# Patient Record
Sex: Female | Born: 1965 | Race: White | Hispanic: No | Smoking: Former smoker
Health system: Southern US, Community
[De-identification: ages and names within clinical notes are randomized; demographics above are authoritative.]

## PROBLEM LIST (undated history)

## (undated) DIAGNOSIS — G4733 Obstructive sleep apnea (adult) (pediatric): Secondary | ICD-10-CM

## (undated) DIAGNOSIS — I10 Essential (primary) hypertension: Secondary | ICD-10-CM

## (undated) DIAGNOSIS — R896 Abnormal cytological findings in specimens from other organs, systems and tissues: Secondary | ICD-10-CM

## (undated) DIAGNOSIS — M2141 Flat foot [pes planus] (acquired), right foot: Secondary | ICD-10-CM

## (undated) DIAGNOSIS — H269 Unspecified cataract: Secondary | ICD-10-CM

## (undated) DIAGNOSIS — M1990 Unspecified osteoarthritis, unspecified site: Secondary | ICD-10-CM

## (undated) DIAGNOSIS — F419 Anxiety disorder, unspecified: Secondary | ICD-10-CM

## (undated) DIAGNOSIS — B009 Herpesviral infection, unspecified: Secondary | ICD-10-CM

## (undated) DIAGNOSIS — IMO0001 Reserved for inherently not codable concepts without codable children: Secondary | ICD-10-CM

## (undated) DIAGNOSIS — E7800 Pure hypercholesterolemia, unspecified: Secondary | ICD-10-CM

## (undated) DIAGNOSIS — G4730 Sleep apnea, unspecified: Secondary | ICD-10-CM

## (undated) DIAGNOSIS — Z9989 Dependence on other enabling machines and devices: Secondary | ICD-10-CM

## (undated) DIAGNOSIS — T7840XA Allergy, unspecified, initial encounter: Secondary | ICD-10-CM

## (undated) DIAGNOSIS — D649 Anemia, unspecified: Secondary | ICD-10-CM

## (undated) DIAGNOSIS — R7303 Prediabetes: Secondary | ICD-10-CM

## (undated) DIAGNOSIS — M2142 Flat foot [pes planus] (acquired), left foot: Secondary | ICD-10-CM

## (undated) HISTORY — DX: Prediabetes: R73.03

## (undated) HISTORY — DX: Reserved for inherently not codable concepts without codable children: IMO0001

## (undated) HISTORY — DX: Obstructive sleep apnea (adult) (pediatric): G47.33

## (undated) HISTORY — DX: Herpesviral infection, unspecified: B00.9

## (undated) HISTORY — DX: Dependence on other enabling machines and devices: Z99.89

## (undated) HISTORY — DX: Anemia, unspecified: D64.9

## (undated) HISTORY — DX: Abnormal cytological findings in specimens from other organs, systems and tissues: R89.6

## (undated) HISTORY — PX: OTHER SURGICAL HISTORY: SHX169

## (undated) HISTORY — DX: Flat foot (pes planus) (acquired), left foot: M21.42

## (undated) HISTORY — DX: Anxiety disorder, unspecified: F41.9

## (undated) HISTORY — PX: MOUTH SURGERY: SHX715

## (undated) HISTORY — DX: Unspecified cataract: H26.9

## (undated) HISTORY — PX: POLYPECTOMY: SHX149

## (undated) HISTORY — DX: Allergy, unspecified, initial encounter: T78.40XA

## (undated) HISTORY — DX: Pure hypercholesterolemia, unspecified: E78.00

## (undated) HISTORY — DX: Essential (primary) hypertension: I10

## (undated) HISTORY — DX: Flat foot (pes planus) (acquired), right foot: M21.41

## (undated) HISTORY — DX: Sleep apnea, unspecified: G47.30

## (undated) HISTORY — PX: COLONOSCOPY: SHX174

## (undated) HISTORY — PX: COLPOSCOPY: SHX161

## (undated) HISTORY — PX: KNEE SURGERY: SHX244

## (undated) HISTORY — DX: Unspecified osteoarthritis, unspecified site: M19.90

---

## 2005-03-13 ENCOUNTER — Other Ambulatory Visit: Admission: RE | Admit: 2005-03-13 | Discharge: 2005-03-13 | Payer: Self-pay | Admitting: Gynecology

## 2005-08-17 ENCOUNTER — Ambulatory Visit: Payer: Self-pay

## 2005-08-17 ENCOUNTER — Encounter: Payer: Self-pay | Admitting: Cardiology

## 2005-12-08 ENCOUNTER — Ambulatory Visit (HOSPITAL_COMMUNITY): Admission: RE | Admit: 2005-12-08 | Discharge: 2005-12-08 | Payer: Self-pay | Admitting: Internal Medicine

## 2005-12-21 ENCOUNTER — Ambulatory Visit: Payer: Self-pay | Admitting: Internal Medicine

## 2006-02-19 ENCOUNTER — Ambulatory Visit: Admission: RE | Admit: 2006-02-19 | Discharge: 2006-02-19 | Payer: Self-pay | Admitting: Internal Medicine

## 2006-05-27 ENCOUNTER — Other Ambulatory Visit: Admission: RE | Admit: 2006-05-27 | Discharge: 2006-05-27 | Payer: Self-pay | Admitting: Gynecology

## 2007-04-12 ENCOUNTER — Ambulatory Visit (HOSPITAL_COMMUNITY): Admission: RE | Admit: 2007-04-12 | Discharge: 2007-04-12 | Payer: Self-pay | Admitting: Internal Medicine

## 2007-05-31 ENCOUNTER — Other Ambulatory Visit: Admission: RE | Admit: 2007-05-31 | Discharge: 2007-05-31 | Payer: Self-pay | Admitting: Gynecology

## 2007-06-28 ENCOUNTER — Ambulatory Visit (HOSPITAL_COMMUNITY): Admission: RE | Admit: 2007-06-28 | Discharge: 2007-06-28 | Payer: Self-pay | Admitting: Gynecology

## 2008-01-30 ENCOUNTER — Ambulatory Visit: Payer: Self-pay | Admitting: Gynecology

## 2008-10-04 ENCOUNTER — Ambulatory Visit: Payer: Self-pay | Admitting: Gynecology

## 2008-10-04 ENCOUNTER — Other Ambulatory Visit: Admission: RE | Admit: 2008-10-04 | Discharge: 2008-10-04 | Payer: Self-pay | Admitting: Gynecology

## 2008-10-04 ENCOUNTER — Encounter: Payer: Self-pay | Admitting: Gynecology

## 2008-10-05 ENCOUNTER — Ambulatory Visit (HOSPITAL_COMMUNITY): Admission: RE | Admit: 2008-10-05 | Discharge: 2008-10-05 | Payer: Self-pay | Admitting: Gynecology

## 2008-10-19 ENCOUNTER — Ambulatory Visit: Payer: Self-pay | Admitting: Gynecology

## 2009-04-25 ENCOUNTER — Ambulatory Visit: Payer: Self-pay | Admitting: Gynecology

## 2009-07-03 ENCOUNTER — Ambulatory Visit: Payer: Self-pay | Admitting: Gynecology

## 2009-07-03 ENCOUNTER — Other Ambulatory Visit: Admission: RE | Admit: 2009-07-03 | Discharge: 2009-07-03 | Payer: Self-pay | Admitting: Gynecology

## 2009-07-12 ENCOUNTER — Ambulatory Visit: Payer: Self-pay | Admitting: Gynecology

## 2009-11-11 ENCOUNTER — Other Ambulatory Visit: Admission: RE | Admit: 2009-11-11 | Discharge: 2009-11-11 | Payer: Self-pay | Admitting: Gynecology

## 2009-11-11 ENCOUNTER — Ambulatory Visit: Payer: Self-pay | Admitting: Gynecology

## 2010-01-22 ENCOUNTER — Ambulatory Visit (HOSPITAL_COMMUNITY)
Admission: RE | Admit: 2010-01-22 | Discharge: 2010-01-22 | Payer: Self-pay | Source: Home / Self Care | Admitting: Gynecology

## 2010-03-16 ENCOUNTER — Encounter: Payer: Self-pay | Admitting: Gynecology

## 2010-05-26 ENCOUNTER — Ambulatory Visit (HOSPITAL_COMMUNITY)
Admission: RE | Admit: 2010-05-26 | Discharge: 2010-05-26 | Disposition: A | Discharge status: Home / Self Care | Payer: BC Managed Care – PPO | Source: Ambulatory Visit | Attending: Internal Medicine | Admitting: Internal Medicine

## 2010-05-26 ENCOUNTER — Other Ambulatory Visit (HOSPITAL_COMMUNITY): Payer: Self-pay | Admitting: Internal Medicine

## 2010-05-26 DIAGNOSIS — M25519 Pain in unspecified shoulder: Secondary | ICD-10-CM | POA: Insufficient documentation

## 2010-05-26 DIAGNOSIS — W19XXXA Unspecified fall, initial encounter: Secondary | ICD-10-CM

## 2010-05-26 DIAGNOSIS — R51 Headache: Secondary | ICD-10-CM | POA: Insufficient documentation

## 2010-07-11 NOTE — Op Note (Signed)
NAME:  Wendy Levine, Wendy Levine NO.:  [id number]   MEDICAL RECORD NO.:  [id number]          PATIENT TYPE:  OUT   LOCATION:  CARD                         FACILITY:  [facility]   PHYSICIAN:  Oley Balm. Sung Amabile, MD   DATE OF BIRTH:  01-17-1966   DATE OF PROCEDURE:  02/19/2006  DATE OF DISCHARGE:  02/19/2006                               OPERATIVE REPORT   PROCEDURE PERFORMED:  Cardiopulmonary stress test.   INDICATION FOR TESTING:  Exertional dyspnea.   PROCEDURE:  Cardiopulmonary stress testing was performed on a graded  treadmill.  Testing was stopped due to dyspnea and heart rate goal.  Effort was maximal.  At peak exercise, oxygen uptake was 25.9 mL per kg  per minute or 77% or predicted maximum indicating mild exercise  impairment.   At peak exercise heart rate was 178 beats per minute or 99% of predicted  maximum, indicating that cardiovascular limitation was reached.  Oxygen  pulse was normal suggesting normal __________ volume.  Blood pressure  response was normal.  EKG tracings revealed no definite abnormality  though there was significant EKG artifact noted at peak exercise.   At peak exercise __________ ventilation was 77.1 L per minute or 74% of  predicted maximum indicating that ventilatory reserve remained.  Gas  exchange parameters revealed no abnormalities.  Baseline pulmonary  function tests revealed normal spirometry, lung volumes and diffusion  capacity.  Post exercise spirometry revealed no exercise induced  bronchospasm.   SUMMARY:  Essentially normal cardiopulmonary stress test.  When her  oxygen uptake is corrected for body weight, she is at 115% of predicted  maximum, suggesting that the major cause of her exercise imitation is  obesity.  Otherwise, no abnormalities noted.      Oley Balm Sung Amabile, MD  Electronically Signed     DBS/MEDQ  D:  03/22/2006  T:  03/22/2006  Job:  [id number]   cc:   Charlaine Dalton. Sherene Sires, MD, FCCP  [address]  [place] [address]   [facility]

## 2010-07-11 NOTE — Assessment & Plan Note (Signed)
[facility]                               PULMONARY OFFICE NOTE   Wendy Levine, Wendy Levine                 MRN:          [id number]  DATE:12/21/2005                            DOB:          [birth year]    PULMONARY CONSULTATION   CHIEF COMPLAINT:  Dyspnea.   HISTORY:  [name] with reproducible dyspnea with exertion  (going up steps) or bending over that has worsened.  It was first noticed a  year ago but has worsened since Labor Day.  In addition, she developed left-  sided chest discomfort since Labor Day that is constant in nature and  reproduced by palpating her pectoralis major muscle.  The chest pain is not  pleuritic and is not related to the dyspnea complaints in terms of onset  pattern.  Interestingly, this patient has a history of obesity and was  previously working on a Scientist, research (physical sciences) and lost about 37 pounds before stopping  the [employer] about the same time she started noticing the dyspnea and has  not been back on it.  The dyspnea does occur walking outside as well as  inside (but only if she goes up steep hills outside).  She denies any  variability in terms of activity tolerance, orthopnea, PND, leg swelling,  cough, pleuritic chest pain, or leg swelling.  She denies anything that  makes the breathing better or worse other than exercise.  Has not been  treated with any medications that helped.   PAST MEDICAL HISTORY:  Significant for fibromyalgia and anxiety disorder, as  well as hyperlipidemia.   ALLERGIES:  None known.   MEDICATIONS:  Crestor and [place].   SOCIAL HISTORY:  She quit smoking in February of 2001.  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   FAMILY HISTORY:  Positive for emphysema and breast cancer in her maternal  grandfather.  Negative for allergies or atopy, or asthma.   REVIEW OF SYSTEMS:  Taken in detail on the worksheet and  significant for  problems as outlined above.   PHYSICAL EXAMINATION:  This is a quite pleasant ambulatory white female in  no acute distress.  VITAL SIGNS:  Stable vital signs.  Weight 207 pounds, down 37 pounds from  her maximum weight of a year ago.  HEENT:  Unremarkable.  Oropharynx is clear.  NECK:  Supple without cervical adenopathy or tenderness. Trachea is midline.  No thyromegaly.  LUNGS:  Fields perfectly clear bilaterally to auscultation and percussion.  Taking 2 deep breaths in and out made her feel dizzy with numbness in her  hands and around her mouth.  CARDIAC:  Regular rate and rhythm without murmur, gallop, or rub.  ABDOMEN:  Soft and benign.  EXTREMITIES:  Warm without calf tenderness, cyanosis, clubbing, or edema.   The patient reports she had an echo done within the last year, which was  normal, and also a full lab profile, which was normal.  A CT scan of the  chest was dated December 08, 2005, showed a minimal anterior mediastinal  partially fatty  density consistent with a residual thymus.  Otherwise,  normal.   IMPRESSION:  Reproducible dyspnea with exertion.  Only occurs with heavy  exertion, such as going up steps or steep hills, and most likely is related  in a somewhat unusual fashion to conditioning.  (Note, she had previously  been working on a Scientist, research (physical sciences) but stopped the [employer] about the same  time the problem began.)  That is, I believe she is now experiencing  shortness of breath doing a task that is pushing her well above her normal  aerobic level.  Of note also she is under quite a bit of stress related to  the use of contractors, but also is related to her father-in-law who has  similar symptoms before he died when he was diagnosed with lung cancer.   I therefore recommended regular exercise 30 minutes daily to the point where  she is short of breath, but not out of breath, and then a followup 2 weeks  later with a CPST to see if we can  reproduce any of her symptoms in the lab.   I have reassured her that it is very unlikely she has pulmonary embolism or  significant heart disease, and certainly not lung cancer based on the  negative studies that have been done to date.    ______________________________  Charlaine Dalton. Sherene Sires, MD, [facility]    MBW/MedQ  DD: 12/21/2005  DT: 12/21/2005  Job #: [id number]   cc:   Lovenia Kim, D.O.

## 2010-09-01 ENCOUNTER — Encounter: Payer: Self-pay | Admitting: *Deleted

## 2010-11-14 ENCOUNTER — Ambulatory Visit (INDEPENDENT_AMBULATORY_CARE_PROVIDER_SITE_OTHER): Payer: 59 | Admitting: Gynecology

## 2010-11-14 ENCOUNTER — Encounter: Payer: Self-pay | Admitting: Gynecology

## 2010-11-14 ENCOUNTER — Other Ambulatory Visit (HOSPITAL_COMMUNITY)
Admission: RE | Admit: 2010-11-14 | Discharge: 2010-11-14 | Disposition: A | Discharge status: Home / Self Care | Payer: 59 | Source: Ambulatory Visit | Attending: Gynecology | Admitting: Gynecology

## 2010-11-14 VITALS — BP 130/70 | Ht 65.25 in | Wt 257.0 lb

## 2010-11-14 DIAGNOSIS — Z01419 Encounter for gynecological examination (general) (routine) without abnormal findings: Secondary | ICD-10-CM

## 2010-11-14 DIAGNOSIS — N898 Other specified noninflammatory disorders of vagina: Secondary | ICD-10-CM

## 2010-11-14 DIAGNOSIS — B3731 Acute candidiasis of vulva and vagina: Secondary | ICD-10-CM

## 2010-11-14 DIAGNOSIS — R87610 Atypical squamous cells of undetermined significance on cytologic smear of cervix (ASC-US): Secondary | ICD-10-CM

## 2010-11-14 DIAGNOSIS — L292 Pruritus vulvae: Secondary | ICD-10-CM

## 2010-11-14 DIAGNOSIS — IMO0001 Reserved for inherently not codable concepts without codable children: Secondary | ICD-10-CM

## 2010-11-14 DIAGNOSIS — L293 Anogenital pruritus, unspecified: Secondary | ICD-10-CM

## 2010-11-14 DIAGNOSIS — B373 Candidiasis of vulva and vagina: Secondary | ICD-10-CM

## 2010-11-14 MED ORDER — NYSTATIN-TRIAMCINOLONE 100000-0.1 UNIT/GM-% EX OINT: TOPICAL_OINTMENT | Freq: Two times a day (BID) | CUTANEOUS | Status: DC

## 2010-11-14 MED ORDER — FLUCONAZOLE 200 MG PO TABS: 200.0000 mg | ORAL_TABLET | Freq: Every day | ORAL | Status: AC

## 2010-11-14 NOTE — Progress Notes (Signed)
Wendy Levine [birth year] [id number]        45 y.o.  for annual exam.  Notes an area above her clitoris which is chronically itchy no lesions. This is always scratching. She does take Diflucan intermittently says it transiently helps. No complaints of discharge or other symptoms. Has history of ASCUS negative high risk HPV August 2010.  Followup Pap smear in May and September last year that again showed ASCUS.  Colposcopy may 2011 was normal no biopsies were taken.  Past medical history,surgical history, medications, allergies, family history and social history were all reviewed and documented in the EPIC chart. ROS:  Was performed and pertinent positives and negatives are included in the history.  Exam: chaperone present Filed Vitals:   11/14/10 1103  BP: 130/70   General appearance  Normal Skin grossly normal Head/Neck normal with no cervical or supraclavicular adenopathy thyroid normal Lungs  clear Cardiac RR, without RMG Abdominal  soft, nontender, without masses, organomegaly or hernia Breasts  examined lying and sitting without masses, retractions, discharge or axillary adenopathy. Pelvic  Ext/BUS/vagina  Normal, the area she's pointing to lower mid mons pubis above the clitoral hood is totally normal in appearance, white thick vaginal discharge noted wet prep done  Cervix  normal  Pap done  Uterus  anteverted, normal size, shape and contour, midline and mobile nontender   Adnexa  Without masses or tenderness    Anus and perineum  normal   Rectovaginal  normal sphincter tone without palpated masses or tenderness. No overt hemorrhoids noted   Assessment/Plan:  45 y.o. female for annual exam.    #1 Pruritic skin mons pubis, thick white discharge. Wet prep is positive for yeast. We'll treat with Diflucan 200 mg x7 days to hopefully eradicate the cutaneous yeast. I also gave her a prescription for Mytrex cream to apply to the skin twice a day. She'll followup if her symptoms  persist or recur.  I asked her to stop her Crestor while she was taking the Diflucan. #2 Persistent ASCUS. Negative high risk HPV. Normal colposcopy. Pap done today if normal follow with annual Pap smear if abnormal triage based on results. #3 Birth control. Patient and husband using condoms. I discussed the failure risk with this and she understands accepts. She did have some questions about the IUD I encouraged her to pursue this she can't think about it. I also discussed the availability of plan B if there is a break or slip and the condom. #4 Health maintenance. Self breast exams on monthly basis discussed encouraged. Due for mammography in November I reminded her to schedule this. No blood work was done this is all done through her primary who follows her for her medical issues. Patient will followup for Pap smear results and otherwise annually for her annual exam.    Dara Lords MD, 12:02 PM 11/14/2010

## 2011-01-05 ENCOUNTER — Other Ambulatory Visit: Payer: Self-pay | Admitting: Gynecology

## 2011-01-30 ENCOUNTER — Telehealth: Payer: Self-pay | Admitting: *Deleted

## 2011-01-30 NOTE — Telephone Encounter (Signed)
Pt called stating that her period is 2 weeks late and took 2 upt test and both were negative. Pt wanted to come by to have blood drawn. I spoke with pt and told her that OV would be best. Pt says she will make office visit on Monday to have this done.

## 2011-02-02 ENCOUNTER — Encounter: Payer: Self-pay | Admitting: Gynecology

## 2011-02-02 ENCOUNTER — Ambulatory Visit (INDEPENDENT_AMBULATORY_CARE_PROVIDER_SITE_OTHER): Payer: 59 | Admitting: Gynecology

## 2011-02-02 DIAGNOSIS — M545 Low back pain, unspecified: Secondary | ICD-10-CM

## 2011-02-02 DIAGNOSIS — B3731 Acute candidiasis of vulva and vagina: Secondary | ICD-10-CM

## 2011-02-02 DIAGNOSIS — B373 Candidiasis of vulva and vagina: Secondary | ICD-10-CM

## 2011-02-02 DIAGNOSIS — N912 Amenorrhea, unspecified: Secondary | ICD-10-CM

## 2011-02-02 DIAGNOSIS — Z23 Encounter for immunization: Secondary | ICD-10-CM

## 2011-02-02 DIAGNOSIS — N898 Other specified noninflammatory disorders of vagina: Secondary | ICD-10-CM

## 2011-02-02 MED ORDER — FLUCONAZOLE 200 MG PO TABS: 200.0000 mg | ORAL_TABLET | Freq: Every day | ORAL | Status: AC

## 2011-02-02 NOTE — Progress Notes (Addendum)
Patient presents with several issues. First is her menses have been late for the last 2 cycles and she has not had a period now since 12/17/2010. She is using condoms for contraception. She is checked 3 home pregnancy tests which were negative. No weight changes hair changes skin changes or menopausal symptoms. She also was seen recently for useful vaginitis which cleared with Diflucan 200 daily x7 days and Mytrex to her skin. She was on a course of oral antibiotics for her face and notes that she has a slight discharge returning. No real itching.  Lastly she notes several days of low back pain although she thinks is due to lifting she tried some heat but did not seem to help. She has not tried anything such as Motrin or Advil. She's not having any dysuria frequency urgency or other urinary symptoms.  Exam with chaperone present Abdomen soft nontender without masses guarding rebound organomegaly Pelvic external BUS vagina normal with white discharge. Cervix normal. Uterus normal size midline mobile nontender. Adnexa without masses or tenderness.   Assessment and plan: #1 mild menstrual irregularity.  Will check qualitative hCG, FSH, TSH, prolactin. Discussed progesterone withdrawal if testing negative. More consistent contraception was reviewed and I get encouraged her to consider Mirena IUD and she is going to do so. #2 slight white discharge. KOH wet prep is positive for yeast. We'll treat with Diflucan 200 daily x5 days follow up if symptoms persist or recur.  #3 low back pain. Will check UA. I suspect is musculoskeletal recommend heat and OTC anti-inflammatories.

## 2011-02-02 NOTE — Patient Instructions (Signed)
Take Diflucan as prescribed. Follow up for blood work results. Consider Mirena IUD as discussed.

## 2011-02-03 ENCOUNTER — Encounter: Payer: Self-pay | Admitting: Gynecology

## 2011-02-03 ENCOUNTER — Telehealth: Payer: Self-pay | Admitting: *Deleted

## 2011-02-03 NOTE — Telephone Encounter (Signed)
Patient called for blood pregnancy test results.  Informed negative.

## 2011-02-04 ENCOUNTER — Telehealth: Payer: Self-pay | Admitting: *Deleted

## 2011-02-04 DIAGNOSIS — E221 Hyperprolactinemia: Secondary | ICD-10-CM

## 2011-02-04 DIAGNOSIS — R350 Frequency of micturition: Secondary | ICD-10-CM

## 2011-02-04 MED ORDER — MEDROXYPROGESTERONE ACETATE 10 MG PO TABS: 10.0000 mg | ORAL_TABLET | Freq: Two times a day (BID) | ORAL | Status: DC

## 2011-02-04 NOTE — Telephone Encounter (Signed)
Lm for pt to call

## 2011-02-04 NOTE — Telephone Encounter (Signed)
Pt informed with the below note. 

## 2011-02-04 NOTE — Telephone Encounter (Signed)
Pt had office visit on 02/02/11 and forgot to leave U/A as directed, she would like to stop and give U/A if possible? She also wanted to know if you were going to give her Rx to start period?

## 2011-02-04 NOTE — Telephone Encounter (Signed)
Dropping off her urine is okay. I was waiting for her other labs to return which they just came back and did show that her prolactin was mildly abnormal. That sometimes can lead to your regular periods. I recommend she recheck a fasting prolactin say she may want to come in and give [place] a urine and a prolactin level in the morning when she is fasting I put the order in. As far as her menses I would go with Provera 10 mg twice a day x5 days call me if her period does not start after this and I put that order and also.

## 2011-02-04 NOTE — Progress Notes (Signed)
Addended by: Ether Griffins on: 02/04/2011 08:37 AM   Modules accepted: Orders

## 2011-02-06 ENCOUNTER — Other Ambulatory Visit (INDEPENDENT_AMBULATORY_CARE_PROVIDER_SITE_OTHER): Payer: 59

## 2011-02-06 DIAGNOSIS — R350 Frequency of micturition: Secondary | ICD-10-CM

## 2011-02-06 DIAGNOSIS — E229 Hyperfunction of pituitary gland, unspecified: Secondary | ICD-10-CM

## 2011-02-06 DIAGNOSIS — E221 Hyperprolactinemia: Secondary | ICD-10-CM

## 2011-03-19 ENCOUNTER — Other Ambulatory Visit: Payer: Self-pay | Admitting: *Deleted

## 2011-03-19 MED ORDER — MEDROXYPROGESTERONE ACETATE 10 MG PO TABS: 10.0000 mg | ORAL_TABLET | Freq: Two times a day (BID) | ORAL | Status: AC

## 2011-04-17 ENCOUNTER — Other Ambulatory Visit: Payer: Self-pay | Admitting: Dermatology

## 2011-04-20 ENCOUNTER — Encounter: Payer: Self-pay | Admitting: *Deleted

## 2011-04-20 ENCOUNTER — Other Ambulatory Visit: Payer: Self-pay | Admitting: *Deleted

## 2011-04-20 MED ORDER — VALACYCLOVIR HCL 500 MG PO TABS: 500.0000 mg | ORAL_TABLET | Freq: Every day | ORAL | Status: DC

## 2011-04-20 NOTE — Telephone Encounter (Signed)
[facility] faxed a request to change Valacyclovir HCL 500 to a 90 day supply.  Is that ok?

## 2011-04-20 NOTE — Telephone Encounter (Signed)
okay

## 2011-04-20 NOTE — Telephone Encounter (Signed)
rx sent in 

## 2011-04-20 NOTE — Progress Notes (Signed)
Patient ID: Wendy Levine, female   DOB: 30-Apr-1965, 46 y.o.   MRN: [id number] Pt called and left message wanting to know when last refill on provera 10 mg. Left message on pt vm that 03/19/11 was last refill.

## 2011-05-14 ENCOUNTER — Other Ambulatory Visit: Payer: Self-pay | Admitting: Gynecology

## 2011-05-14 DIAGNOSIS — Z1231 Encounter for screening mammogram for malignant neoplasm of breast: Secondary | ICD-10-CM

## 2011-06-08 ENCOUNTER — Ambulatory Visit (HOSPITAL_COMMUNITY)
Admission: RE | Admit: 2011-06-08 | Discharge: 2011-06-08 | Disposition: A | Discharge status: Home / Self Care | Payer: 59 | Source: Ambulatory Visit | Attending: Internal Medicine | Admitting: Internal Medicine

## 2011-06-08 ENCOUNTER — Other Ambulatory Visit (HOSPITAL_COMMUNITY): Payer: Self-pay | Admitting: Internal Medicine

## 2011-06-08 DIAGNOSIS — R072 Precordial pain: Secondary | ICD-10-CM

## 2011-06-08 DIAGNOSIS — R0602 Shortness of breath: Secondary | ICD-10-CM | POA: Insufficient documentation

## 2011-06-11 ENCOUNTER — Ambulatory Visit (HOSPITAL_COMMUNITY)
Admission: RE | Admit: 2011-06-11 | Discharge: 2011-06-11 | Disposition: A | Discharge status: Home / Self Care | Payer: 59 | Source: Ambulatory Visit | Attending: Gynecology | Admitting: Gynecology

## 2011-06-11 DIAGNOSIS — Z1231 Encounter for screening mammogram for malignant neoplasm of breast: Secondary | ICD-10-CM

## 2012-04-13 ENCOUNTER — Ambulatory Visit (INDEPENDENT_AMBULATORY_CARE_PROVIDER_SITE_OTHER): Payer: 59 | Admitting: Gynecology

## 2012-04-13 ENCOUNTER — Encounter: Payer: Self-pay | Admitting: Gynecology

## 2012-04-13 DIAGNOSIS — A499 Bacterial infection, unspecified: Secondary | ICD-10-CM

## 2012-04-13 DIAGNOSIS — N898 Other specified noninflammatory disorders of vagina: Secondary | ICD-10-CM

## 2012-04-13 DIAGNOSIS — B9689 Other specified bacterial agents as the cause of diseases classified elsewhere: Secondary | ICD-10-CM

## 2012-04-13 DIAGNOSIS — N760 Acute vaginitis: Secondary | ICD-10-CM

## 2012-04-13 DIAGNOSIS — L293 Anogenital pruritus, unspecified: Secondary | ICD-10-CM

## 2012-04-13 LAB — WET PREP FOR TRICH, YEAST, CLUE
Trich, Wet Prep: NONE SEEN
Yeast Wet Prep HPF POC: NONE SEEN

## 2012-04-13 MED ORDER — VALACYCLOVIR HCL 500 MG PO TABS: 500.0000 mg | ORAL_TABLET | Freq: Every day | ORAL | Status: DC

## 2012-04-13 MED ORDER — METRONIDAZOLE 500 MG PO TABS: 500.0000 mg | ORAL_TABLET | Freq: Two times a day (BID) | ORAL | Status: DC

## 2012-04-13 MED ORDER — MEGESTROL ACETATE 20 MG PO TABS: 20.0000 mg | ORAL_TABLET | Freq: Two times a day (BID) | ORAL | Status: DC

## 2012-04-13 MED ORDER — FLUCONAZOLE 200 MG PO TABS: 200.0000 mg | ORAL_TABLET | Freq: Every day | ORAL | Status: DC

## 2012-04-13 NOTE — Patient Instructions (Signed)
Take Flagyl 500 mg twice daily for 7 days, avoid alcohol Take Diflucan daily for 2 days Start Megace 20 mg tablet twice daily once your period starts to help decrease the heaviness of bleeding. Follow up for annual exam that needs to be scheduled now.

## 2012-04-13 NOTE — Progress Notes (Signed)
Patient presents with several issues. She said several weeks of vulvar pruritus that she's used OTC antifungal without relief. Is also due to go on a trip overseas in 2 weeks and wants to know about how she can suppress her menses as she tends to have very heavy crampy periods.  Exam with Sherrilyn Rist assistant Abdomen obese, soft nontender without masses guarding rebound organomegaly Pelvic external BUS vagina with generalized mild vulvitis. No specific lesions. Vagina with white discharge. Bimanual without masses or tenderness.  Assessment and plan: 1. Vulvovaginitis. Wet prep consistent with bacterial vaginosis. We'll treat with Flagyl 500 mg twice a day x7 days, alcohol avoidance reviewed. We'll also cover for yeast as she is traveling out of country with Diflucan 200 mg daily x2 days. 2. Refill on her Valtrex 500 mg daily that she takes for suppression #30 with 1 refill 3. Menstrual suppression. She is midcycle now I reviewed this very difficult to suppress periods once the cycle has begun. Using rhythm method for contraception. Recommended Megace 20 mg twice a day once her period starts to see if we can suppress heavy bleeding. #30 provided. Failure risk discussed. 4. Overdue for annual. Patient knows she is overdue for her annual as her last was September 2012 and she will schedule it now. We'll further discuss her heavy menses and options at that time.

## 2012-04-18 ENCOUNTER — Encounter: Payer: Self-pay | Admitting: *Deleted

## 2012-04-18 NOTE — Progress Notes (Signed)
Patient ID: Wendy Levine, female   DOB: 1965/11/19, 47 y.o.   MRN: [id number] Pt left message on voicemail with question if pt husband should be on any medication from last OV pt was dx: BV infection, I left message that husband is okay without rx.

## 2012-05-10 ENCOUNTER — Encounter: Payer: 59 | Admitting: Gynecology

## 2012-05-18 ENCOUNTER — Other Ambulatory Visit (HOSPITAL_COMMUNITY)
Admission: RE | Admit: 2012-05-18 | Discharge: 2012-05-18 | Disposition: A | Discharge status: Home / Self Care | Payer: 59 | Source: Ambulatory Visit | Attending: Gynecology | Admitting: Gynecology

## 2012-05-18 ENCOUNTER — Other Ambulatory Visit: Payer: Self-pay | Admitting: Gynecology

## 2012-05-18 ENCOUNTER — Telehealth: Payer: Self-pay | Admitting: Gynecology

## 2012-05-18 ENCOUNTER — Ambulatory Visit (INDEPENDENT_AMBULATORY_CARE_PROVIDER_SITE_OTHER): Payer: 59 | Admitting: Gynecology

## 2012-05-18 ENCOUNTER — Encounter: Payer: Self-pay | Admitting: Gynecology

## 2012-05-18 VITALS — BP 134/80 | Ht 66.0 in | Wt 260.0 lb

## 2012-05-18 DIAGNOSIS — N920 Excessive and frequent menstruation with regular cycle: Secondary | ICD-10-CM

## 2012-05-18 DIAGNOSIS — Z3049 Encounter for surveillance of other contraceptives: Secondary | ICD-10-CM

## 2012-05-18 DIAGNOSIS — Z01419 Encounter for gynecological examination (general) (routine) without abnormal findings: Secondary | ICD-10-CM

## 2012-05-18 DIAGNOSIS — Z13220 Encounter for screening for lipoid disorders: Secondary | ICD-10-CM

## 2012-05-18 DIAGNOSIS — L293 Anogenital pruritus, unspecified: Secondary | ICD-10-CM

## 2012-05-18 DIAGNOSIS — Z1151 Encounter for screening for human papillomavirus (HPV): Secondary | ICD-10-CM | POA: Insufficient documentation

## 2012-05-18 DIAGNOSIS — L292 Pruritus vulvae: Secondary | ICD-10-CM

## 2012-05-18 LAB — LIPID PANEL
Cholesterol: 193 mg/dL (ref 0–200)
HDL: 43 mg/dL (ref >39)
LDL Cholesterol: 101 mg/dL — ABNORMAL HIGH (ref 0–99)
Total CHOL/HDL Ratio: 4.5 Ratio
Triglycerides: 246 mg/dL — ABNORMAL HIGH (ref <150)
VLDL: 49 mg/dL — ABNORMAL HIGH (ref 0–40)

## 2012-05-18 LAB — CBC WITH DIFFERENTIAL/PLATELET
Basophils Absolute: 0.1 10*3/uL (ref 0.0–0.1)
Basophils Relative: 1 % (ref 0–1)
Eosinophils Absolute: 0.1 10*3/uL (ref 0.0–0.7)
Eosinophils Relative: 1 % (ref 0–5)
HCT: 39.3 % (ref 36.0–46.0)
Hemoglobin: 13 g/dL (ref 12.0–15.0)
Lymphocytes Relative: 19 % (ref 12–46)
Lymphs Abs: 1.5 10*3/uL (ref 0.7–4.0)
MCH: 26.7 pg (ref 26.0–34.0)
MCHC: 33.1 g/dL (ref 30.0–36.0)
MCV: 80.9 fL (ref 78.0–100.0)
Monocytes Absolute: 0.4 10*3/uL (ref 0.1–1.0)
Monocytes Relative: 5 % (ref 3–12)
Neutro Abs: 6.2 10*3/uL (ref 1.7–7.7)
Neutrophils Relative %: 74 % (ref 43–77)
Platelets: 292 10*3/uL (ref 150–400)
RBC: 4.86 MIL/uL (ref 3.87–5.11)
RDW: 14.7 % (ref 11.5–15.5)
WBC: 8.3 10*3/uL (ref 4.0–10.5)

## 2012-05-18 LAB — COMPREHENSIVE METABOLIC PANEL
ALT: 17 U/L (ref 0–35)
AST: 15 U/L (ref 0–37)
Albumin: 4.3 g/dL (ref 3.5–5.2)
Alkaline Phosphatase: 81 U/L (ref 39–117)
BUN: 13 mg/dL (ref 6–23)
CO2: 25 mEq/L (ref 19–32)
Calcium: 9.2 mg/dL (ref 8.4–10.5)
Chloride: 103 mEq/L (ref 96–112)
Creat: 0.84 mg/dL (ref 0.50–1.10)
Glucose, Bld: 85 mg/dL (ref 70–99)
Potassium: 4.1 mEq/L (ref 3.5–5.3)
Sodium: 137 mEq/L (ref 135–145)
Total Bilirubin: 0.5 mg/dL (ref 0.3–1.2)
Total Protein: 7.2 g/dL (ref 6.0–8.3)

## 2012-05-18 LAB — HM PAP SMEAR: HM Pap smear: NORMAL

## 2012-05-18 MED ORDER — LEVONORGESTREL 20 MCG/24HR IU IUD: INTRAUTERINE_SYSTEM | Freq: Once | INTRAUTERINE | Status: DC

## 2012-05-18 NOTE — Telephone Encounter (Signed)
I left message on pt cell(ok per DPR) that the Mirena IUD is covered at 100%,no copay. She will call to schedule on her cycle.WL

## 2012-05-18 NOTE — Patient Instructions (Signed)
Followup with decision about IUD. Schedule mammogram in April. Intrauterine Device Insertion Most often, an intrauterine device (IUD) is inserted into the uterus to prevent pregnancy. There are 2 types of IUDs available:  Copper IUD. This type of IUD creates an environment that is not favorable to sperm survival. The mechanism of action of the copper IUD is not known for certain. It can stay in place for 10 years.  Hormone IUD. This type of IUD contains the hormone progestin (synthetic progesterone). The progestin thickens the cervical mucus and prevents sperm from entering the uterus, and it also thins the uterine lining. There is no evidence that the hormone IUD prevents implantation. The hormone IUD can stay in place for up to 5 years. An IUD is the most cost-effective birth control if left in place for the full duration. It may be removed at any time. LET YOUR CAREGIVER KNOW ABOUT:  Sensitivity to metals.  Medicines taken including herbs, eyedrops, over-the-counter medicines, and creams.  Use of steroids (by mouth or creams).  Previous problems with anesthetics or numbing medicine.  Previous gynecological surgery.  History of blood clots or clotting disorders.  Possibility of pregnancy.  Menstrual irregularities.  Concerns regarding unusual vaginal discharge or odors.  Previous experience with an IUD.  Other health problems. RISKS AND COMPLICATIONS  Accidental puncture (perforation) of the uterus.  Accidental placement of the IUD either in the muscle layer of the uterus (myometrium) or outside the uterus. If this happen, the IUD can be found essentially floating around the bowels. When this happens, the IUD must be taken out surgically.  The IUD may fall out of the uterus (expulsion). This is more common in women who have recently had a child.   Pregnancy in the fallopian tube (ectopic). BEFORE THE PROCEDURE  Schedule the IUD insertion for when you will have your  menstrual period or right after, to make sure you are not pregnant. Placement of the IUD is better tolerated shortly after a menstrual cycle.  You may need to take tests or be examined to make sure you are not pregnant.  You may be required to take a pregnancy test.  You may be required to get checked for sexually transmitted infections (STIs) prior to placement. Placing an IUD in someone who has an infection can make an infection worse.  You may be given a pain reliever to take 1 or 2 hours before the procedure.  An exam will be performed to determine the size and position of your uterus.  Ask your caregiver about changing or stopping your regular medicines. PROCEDURE   A tool (speculum) is placed in the vagina. This allows your caregiver to see the lower part of the uterus (cervix).  The cervix is prepped with a medicine that lowers the risk of infection.  You may be given a medicine to numb each side of the cervix (intracervical or paracervical block). This is used to block and control any discomfort with insertion.  A tool (uterine sound) is inserted into the uterus to determine the length of the uterine cavity and the direction the uterus may be tilted.  A slim instrument (IUD inserter) is inserted through the cervical canal and into your uterus.  The IUD is placed in the uterine cavity and the insertion device is removed.  The nylon string that is attached to the IUD, and used for eventual IUD removal, is trimmed. It is trimmed so that it lays high in the vagina, just outside the cervix. AFTER  THE PROCEDURE  You may have bleeding after the procedure. This is normal. It varies from light spotting for a few days to menstrual-like bleeding.  You may have mild cramping.  Practice checking the string coming out of the cervix to make sure the IUD remains in the uterus. If you cannot feel the string, you should schedule a "string check" with your caregiver.  If you had a hormone  IUD inserted, expect that your period may be lighter or nonexistent within a year's time (though this is not always the case). There may be delayed fertility with the hormone IUD as a result of its progesterone effect. When you are ready to become pregnant, it is suggested to have the IUD removed up to 1 year in advance.  Yearly exams are advised. Document Released: 10/08/2010 Document Revised: 05/04/2011 Document Reviewed: 10/08/2010 [facility] Patient Information 2013 [place], [place].
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
## 2012-05-19 LAB — URINALYSIS W MICROSCOPIC + REFLEX CULTURE
Bacteria, UA: NONE SEEN
Bilirubin Urine: NEGATIVE
Casts: NONE SEEN
Crystals: NONE SEEN
Glucose, UA: NEGATIVE mg/dL
Hgb urine dipstick: NEGATIVE
Ketones, ur: NEGATIVE mg/dL
Leukocytes, UA: NEGATIVE
Nitrite: NEGATIVE
Protein, ur: NEGATIVE mg/dL
Specific Gravity, Urine: 1.021 (ref 1.005–1.030)
Squamous Epithelial / LPF: NONE SEEN
Urobilinogen, UA: 0.2 mg/dL (ref 0.0–1.0)
pH: 5.5 (ref 5.0–8.0)

## 2012-05-23 ENCOUNTER — Telehealth: Payer: Self-pay | Admitting: *Deleted

## 2012-05-23 ENCOUNTER — Other Ambulatory Visit: Payer: Self-pay | Admitting: Gynecology

## 2012-05-23 ENCOUNTER — Other Ambulatory Visit: Payer: Self-pay | Admitting: *Deleted

## 2012-05-23 DIAGNOSIS — Z1231 Encounter for screening mammogram for malignant neoplasm of breast: Secondary | ICD-10-CM

## 2012-05-23 DIAGNOSIS — E7800 Pure hypercholesterolemia, unspecified: Secondary | ICD-10-CM

## 2012-05-23 NOTE — Telephone Encounter (Signed)
Pt has IUD insertion appointment on 05/31/12, pt said that you told her that you would give her a shot to help numb her cervix? She asked me about this information and I told her I would check with you. Pt said she doesn't handle pain well and this will help with the discomfort. Please advise

## 2012-05-23 NOTE — Telephone Encounter (Signed)
Left the below note on pt voicemail. 

## 2012-05-23 NOTE — Telephone Encounter (Signed)
I talked about doing a paracervical block which will help with discomfort with placement.

## 2012-05-31 ENCOUNTER — Ambulatory Visit: Payer: 59 | Admitting: Gynecology

## 2012-06-13 ENCOUNTER — Ambulatory Visit (HOSPITAL_COMMUNITY)
Admission: RE | Admit: 2012-06-13 | Discharge: 2012-06-13 | Disposition: A | Discharge status: Home / Self Care | Payer: 59 | Source: Ambulatory Visit | Attending: Gynecology | Admitting: Gynecology

## 2012-06-13 DIAGNOSIS — Z1231 Encounter for screening mammogram for malignant neoplasm of breast: Secondary | ICD-10-CM

## 2012-06-13 LAB — HM MAMMOGRAPHY: HM Mammogram: NORMAL

## 2012-06-27 ENCOUNTER — Telehealth: Payer: Self-pay

## 2012-06-27 NOTE — Telephone Encounter (Signed)
I was holding Mirena IUD for patient since March and I called to follow-up with patient. She said she does not think she wants it and will call if she changes her mind but asked me to return it to stock.

## 2012-07-19 ENCOUNTER — Other Ambulatory Visit: Payer: Self-pay | Admitting: Gynecology

## 2012-07-22 ENCOUNTER — Other Ambulatory Visit: Payer: Self-pay | Admitting: *Deleted

## 2012-07-22 DIAGNOSIS — N898 Other specified noninflammatory disorders of vagina: Secondary | ICD-10-CM

## 2012-07-22 DIAGNOSIS — B3731 Acute candidiasis of vulva and vagina: Secondary | ICD-10-CM

## 2012-07-22 DIAGNOSIS — B373 Candidiasis of vulva and vagina: Secondary | ICD-10-CM

## 2012-07-22 DIAGNOSIS — L292 Pruritus vulvae: Secondary | ICD-10-CM

## 2012-07-22 MED ORDER — NYSTATIN-TRIAMCINOLONE 100000-0.1 UNIT/GM-% EX OINT: TOPICAL_OINTMENT | Freq: Two times a day (BID) | CUTANEOUS | Status: AC

## 2012-08-24 ENCOUNTER — Other Ambulatory Visit: Payer: Self-pay | Admitting: Dermatology

## 2012-09-19 ENCOUNTER — Other Ambulatory Visit: Payer: Self-pay | Admitting: Gynecology

## 2012-09-28 ENCOUNTER — Other Ambulatory Visit: Payer: Self-pay | Admitting: Dermatology

## 2012-11-14 ENCOUNTER — Encounter: Payer: 59 | Admitting: Gynecology

## 2012-11-18 ENCOUNTER — Ambulatory Visit: Payer: 59 | Admitting: Gynecology

## 2012-11-21 ENCOUNTER — Ambulatory Visit (INDEPENDENT_AMBULATORY_CARE_PROVIDER_SITE_OTHER): Payer: 59 | Admitting: Gynecology

## 2012-11-21 ENCOUNTER — Telehealth: Payer: Self-pay | Admitting: *Deleted

## 2012-11-21 ENCOUNTER — Encounter: Payer: Self-pay | Admitting: Gynecology

## 2012-11-21 DIAGNOSIS — Z23 Encounter for immunization: Secondary | ICD-10-CM

## 2012-11-21 DIAGNOSIS — N898 Other specified noninflammatory disorders of vagina: Secondary | ICD-10-CM

## 2012-11-21 DIAGNOSIS — L293 Anogenital pruritus, unspecified: Secondary | ICD-10-CM

## 2012-11-21 LAB — WET PREP FOR TRICH, YEAST, CLUE
Clue Cells Wet Prep HPF POC: NONE SEEN
Trich, Wet Prep: NONE SEEN
Yeast Wet Prep HPF POC: NONE SEEN

## 2012-11-21 MED ORDER — NONFORMULARY OR COMPOUNDED ITEM: Status: DC

## 2012-11-21 NOTE — Addendum Note (Signed)
Addended by: Dayna Barker on: 11/21/2012 11:36 AM   Modules accepted: Orders

## 2012-11-21 NOTE — Telephone Encounter (Signed)
Message copied by Aura Camps on Mon Nov 21, 2012 11:28 AM ------      Message from: Dara Lords      Created: Mon Nov 21, 2012 11:11 AM       Call in Boric Acid Supp. 600mg  [facility]  #30 one er vagina 2 x weekly refill x 3 ------

## 2012-11-21 NOTE — Progress Notes (Signed)
Patient presents with one-week history of vaginal itching and vulvar itching. She used an OTC homeopathic suppository that had Borax unit. She notes this seems to have helped with resolving symptoms. She does have a history of recurrent yeast infections has been treated with Mytrex externally, Diflucan and vaginal creams in the past. Feels she gets an outbreak at least once a month. Particularly before her period.  Exam was Administrator, [employer] vagina with white discharge. Cervix normal. Uterus normal size midline mobile nontender. Adnexa without masses or tenderness.  Assessment and plan: History of recurrent vaginitis. Wet prep shows some bacteria but no evidence of yeast. Has been using the OTC suppository. Reviewed with her options to include a trial of boric acid 600 mg suppositories twice weekly as a suppressant. Patient wants to try and I prescribed boric acid 600 mg suppositories #30 with 2 refills.

## 2012-11-21 NOTE — Patient Instructions (Signed)
Start on the boric acid suppositories twice weekly to see if we cannot suppress your recurrent vaginal irritation. Followup as needed.

## 2012-11-21 NOTE — Telephone Encounter (Signed)
[facility] called [facility] 

## 2013-01-02 ENCOUNTER — Encounter: Payer: Self-pay | Admitting: Physician Assistant

## 2013-01-23 ENCOUNTER — Other Ambulatory Visit: Payer: Self-pay | Admitting: Internal Medicine

## 2013-02-05 ENCOUNTER — Encounter: Payer: Self-pay | Admitting: Internal Medicine

## 2013-02-05 DIAGNOSIS — IMO0002 Reserved for concepts with insufficient information to code with codable children: Secondary | ICD-10-CM | POA: Insufficient documentation

## 2013-02-05 DIAGNOSIS — E7800 Pure hypercholesterolemia, unspecified: Secondary | ICD-10-CM | POA: Insufficient documentation

## 2013-02-05 DIAGNOSIS — B009 Herpesviral infection, unspecified: Secondary | ICD-10-CM | POA: Insufficient documentation

## 2013-02-05 DIAGNOSIS — I10 Essential (primary) hypertension: Secondary | ICD-10-CM | POA: Insufficient documentation

## 2013-02-06 ENCOUNTER — Ambulatory Visit (INDEPENDENT_AMBULATORY_CARE_PROVIDER_SITE_OTHER): Payer: 59 | Admitting: Physician Assistant

## 2013-02-06 ENCOUNTER — Encounter: Payer: Self-pay | Admitting: Physician Assistant

## 2013-02-06 VITALS — BP 136/84 | HR 72 | Temp 97.5°F | Resp 16 | Ht 65.0 in | Wt 268.0 lb

## 2013-02-06 DIAGNOSIS — K219 Gastro-esophageal reflux disease without esophagitis: Secondary | ICD-10-CM

## 2013-02-06 DIAGNOSIS — Z1212 Encounter for screening for malignant neoplasm of rectum: Secondary | ICD-10-CM

## 2013-02-06 DIAGNOSIS — Z0000 Encounter for general adult medical examination without abnormal findings: Secondary | ICD-10-CM

## 2013-02-06 LAB — HEPATIC FUNCTION PANEL
ALT: 15 U/L (ref 0–35)
AST: 15 U/L (ref 0–37)
Albumin: 4.5 g/dL (ref 3.5–5.2)
Alkaline Phosphatase: 82 U/L (ref 39–117)
Bilirubin, Direct: 0.1 mg/dL (ref 0.0–0.3)
Indirect Bilirubin: 0.3 mg/dL (ref 0.0–0.9)
Total Bilirubin: 0.4 mg/dL (ref 0.3–1.2)
Total Protein: 7.3 g/dL (ref 6.0–8.3)

## 2013-02-06 LAB — BASIC METABOLIC PANEL WITH GFR
BUN: 16 mg/dL (ref 6–23)
CO2: 28 mEq/L (ref 19–32)
Calcium: 9.4 mg/dL (ref 8.4–10.5)
Chloride: 98 mEq/L (ref 96–112)
Creat: 0.83 mg/dL (ref 0.50–1.10)
GFR, Est African American: >89 mL/min
GFR, Est Non African American: 85 mL/min
Glucose, Bld: 90 mg/dL (ref 70–99)
Potassium: 3.9 mEq/L (ref 3.5–5.3)
Sodium: 136 mEq/L (ref 135–145)

## 2013-02-06 LAB — IRON AND TIBC
%SAT: 14 % — ABNORMAL LOW (ref 20–55)
Iron: 64 ug/dL (ref 42–145)
TIBC: 471 ug/dL — ABNORMAL HIGH (ref 250–470)
UIBC: 407 ug/dL — ABNORMAL HIGH (ref 125–400)

## 2013-02-06 LAB — CBC WITH DIFFERENTIAL/PLATELET
Basophils Absolute: 0.1 10*3/uL (ref 0.0–0.1)
Basophils Relative: 1 % (ref 0–1)
Eosinophils Absolute: 0.2 10*3/uL (ref 0.0–0.7)
Eosinophils Relative: 2 % (ref 0–5)
HCT: 39.2 % (ref 36.0–46.0)
Hemoglobin: 13.1 g/dL (ref 12.0–15.0)
Lymphocytes Relative: 18 % (ref 12–46)
Lymphs Abs: 1.5 10*3/uL (ref 0.7–4.0)
MCH: 27.9 pg (ref 26.0–34.0)
MCHC: 33.4 g/dL (ref 30.0–36.0)
MCV: 83.6 fL (ref 78.0–100.0)
Monocytes Absolute: 0.5 10*3/uL (ref 0.1–1.0)
Monocytes Relative: 6 % (ref 3–12)
Neutro Abs: 6.4 10*3/uL (ref 1.7–7.7)
Neutrophils Relative %: 73 % (ref 43–77)
Platelets: 281 10*3/uL (ref 150–400)
RBC: 4.69 MIL/uL (ref 3.87–5.11)
RDW: 14.8 % (ref 11.5–15.5)
WBC: 8.7 10*3/uL (ref 4.0–10.5)

## 2013-02-06 LAB — VITAMIN B12: Vitamin B-12: 715 pg/mL (ref 211–911)

## 2013-02-06 LAB — FERRITIN: Ferritin: 32 ng/mL (ref 10–291)

## 2013-02-06 LAB — LIPID PANEL
Cholesterol: 169 mg/dL (ref 0–200)
HDL: 43 mg/dL (ref >39)
LDL Cholesterol: 95 mg/dL (ref 0–99)
Total CHOL/HDL Ratio: 3.9 Ratio
Triglycerides: 153 mg/dL — ABNORMAL HIGH (ref <150)
VLDL: 31 mg/dL (ref 0–40)

## 2013-02-06 LAB — HEMOGLOBIN A1C
Hgb A1c MFr Bld: 5.6 % (ref <5.7)
Mean Plasma Glucose: 114 mg/dL (ref <117)

## 2013-02-06 LAB — TSH: TSH: 1.686 u[IU]/mL (ref 0.350–4.500)

## 2013-02-06 LAB — MAGNESIUM: Magnesium: 2 mg/dL (ref 1.5–2.5)

## 2013-02-06 MED ORDER — ALPRAZOLAM 0.5 MG PO TABS: 0.5000 mg | ORAL_TABLET | Freq: Two times a day (BID) | ORAL | Status: DC | PRN

## 2013-02-06 MED ORDER — BACLOFEN 10 MG PO TABS: 10.0000 mg | ORAL_TABLET | Freq: Two times a day (BID) | ORAL | Status: DC

## 2013-02-06 NOTE — Progress Notes (Signed)
Complete Physical HPI Patient presents for complete physical.   Patient's blood pressure has been controlled at home. Patient denies chest pain, shortness of breath, dizziness. BP: 136/84 mmHg  Patient's cholesterol is diet controlled. She is on Crestor and denies myalgias The patient's cholesterol last visit was LDL of 104 and trigs 157.  The patient has been working on diet and exercise for prediabetes, denies changes in vision, polys, and paresthesias. Last A1C in office was  A1C 5.3 and insulin is 87.  She is currently on her menses and has iron def anemia likely from this, states she is feeling better with iron pills.  She has CPAP and feels better when she uses it.  States for past month/last 2 weeks had constant heart burn, she stopped all alcohol and spicy food and it has helped.  Current Medications:  Current Outpatient Prescriptions on File Prior to Visit  Medication Sig Dispense Refill  . ALPRAZolam (XANAX PO) Take 0.5 mg by mouth 3 (three) times daily as needed.       . CRESTOR 10 MG tablet TAKE ONE TABLET BY MOUTH DAILY  30 tablet  2  . ergocalciferol (VITAMIN D2) 50000 UNITS capsule Take 50,000 Units by mouth once a week. Twice weekly      . hydrochlorothiazide (HYDRODIURIL) 25 MG tablet TAKE ONE TABLET BY MOUTH DAILY  90 tablet  0  . MAGNESIUM PO Take by mouth.        . NONFORMULARY OR COMPOUNDED ITEM Boric acid supp 600 mg one per vagina twice weekly.  30 each  3  . nystatin-triamcinolone ointment (MYCOLOG) Apply topically 2 (two) times daily.  30 g  2  . valACYclovir (VALTREX) 500 MG tablet TAKE 1 TABLET (500 MG TOTAL) BY MOUTH DAILY.  30 tablet  10   No current facility-administered medications on file prior to visit.   Health Maintenance:  Tetanus: 2007 Pneumovax: N/A Flu vaccine: 2014 Zostavax: N/A Pap: 05/18/2012 normal MGM: 05/2012 DEXA: N/A Colonoscopy: 2005 Normal out of state EGD:N/A Allergies:  Allergies  Allergen Reactions  . Doxycycline Hives and  Shortness Of Breath  . Adhesive [Tape]   . Amoxicillin-Pot Clavulanate Nausea Only    diarrhea   Medical History:  Past Medical History  Diagnosis Date  . ASCUS (atypical squamous cells of undetermined significance) on Pap smear     neg HR HPV 09/2008, ascus 06/2009,10/2009  . HSV-2 infection   . Hypercholesteremia   . Hypertension   . OSA on CPAP   . Anemia   . Allergy   . Prediabetes    Surgical History:  Past Surgical History  Procedure Laterality Date  . Cesarean section    . Remove moles    . Colposcopy     Family History:  Family History  Problem Relation Age of Onset  . Hypertension Maternal Grandmother   . Breast cancer Maternal Grandmother 80  . Emphysema Maternal Grandmother   . Hypertension Mother   . Hyperlipidemia Mother    Social History:  History   Social History  . Marital Status: Married    Spouse Name: N/A    Number of Children: N/A  . Years of Education: N/A   Occupational History  . Not on file.   Social History Main Topics  . Smoking status: Former Games developer  . Smokeless tobacco: Never Used  . Alcohol Use: 3.5 oz/week    7 drink(s) per week  . Drug Use: No  . Sexual Activity: Yes    Partners: Male  Birth Control/ Protection: Condom, Rhythm   Other Topics Concern  . Not on file   Social History Narrative  . No narrative on file   ROS Constitutional: +Headache Denies weight loss/gain, insomnia, fatigue, night sweats, and change in appetite. Eyes: Denies redness, blurred vision, diplopia, discharge, itchy, watery eyes.  ENT: Denies discharge, congestion, post nasal drip, sore throat, earache, hearing loss, dental pain, Tinnitus, Vertigo, Sinus pain, snoring.  Cardio: ([facility]) Normal echo EF 55% 2007 Denies chest pain, palpitations, irregular heartbeat, dyspnea, diaphoresis, orthopnea, PND, claudication, edema Respiratory: denies cough, dyspnea, pleurisy, hoarseness, wheezing.  Gastrointestinal: + GERD Denies dysphagia, pain, cramps,  nausea, vomiting, bloating, diarrhea, constipation, hematemesis, melena, hematochezia, hemorrhoids Genitourinary: (Dr. Audie Box) Denies dysuria, frequency, urgency, nocturia, hesitancy, discharge, hematuria, flank pain Breast: Denies Breast lumps, nipple discharge, bleeding.  Musculoskeletal: + B knee pain Denies arthralgia, myalgia, stiffness, Jt. Swelling, pain, Skin: (Dr. Nicholas Lose) Last seen 3 weeks ago. Denies pruritis, rash, hives,  acne, eczema, changing in skin lesion Neuro: Denies Weakness, tremor, incoordination, spasms, paresthesia, pain Psychiatric: Denies confusion, memory loss, sensory loss Endocrine: Denies change in weight, skin, hair change, nocturia, and paresthesia, Diabetic Denies Polys, visual blurring, hyper /hypo glycemic episodes.  Heme/Lymph: Denies Excessive bleeding, bruising, enlarged lymph nodes  Physical Exam: Estimated body mass index is 44.6 kg/(m^2) as calculated from the following:   Height as of this encounter: 5\' 5"  (1.651 m).   Weight as of this encounter: 268 lb (121.564 kg). Filed Vitals:   02/06/13 1353  BP: 136/84  Pulse: 72  Temp: 97.5 F (36.4 C)  Resp: 16   General Appearance: Well nourished, in no apparent distress. Eyes: PERRLA, EOMs, conjunctiva no swelling or erythema, normal fundi and vessels. Sinuses: No Frontal/maxillary tenderness ENT/Mouth: + TMJ tenderness bilaterally Ext aud canals clear, normal light reflex with TMs without erythema, bulging.  Good dentition. No erythema, swelling, or exudate on post pharynx. Tonsils not swollen or erythematous. Hearing normal.  Neck: Supple, thyroid normal. No bruits Respiratory: Respiratory effort normal, BS equal bilaterally without rales, rhonchi, wheezing or stridor. Cardio: RRR without murmurs, rubs or gallops. Brisk peripheral pulses without edema.  Chest: symmetric, with normal excursions and percussion. Breasts: Symmetric, without lumps, nipple discharge, retractions. Abdomen: Soft, +BS. +  epigastric tender, no guarding, rebound, hernias, masses, or organomegaly. .  Lymphatics: Non tender without lymphadenopathy.  Genitourinary: defer Musculoskeletal: Full ROM all peripheral extremities,5/5 strength, and normal gait. Skin: Warm, dry without rashes, lesions, ecchymosis.  Neuro: Cranial nerves intact, reflexes equal bilaterally. Normal muscle tone, no cerebellar symptoms. Sensation intact.  Psych: Awake and oriented X 3, normal affect, Insight and Judgment appropriate.   EKG: WNL no changes.  Assessment and Plan: Health Maintenance ASCUS (atypical squamous cells of undetermined significance) on Pap smear- continue to follow Dr. Audie Box  HSV-2 infection- continue valtrex PRN  Hypercholesteremia- continue crestor and check lipids  Hypertension- continue meds  OSA on CPAP- feels better  Anemia- check level  Allergy- continue meds  Prediabetes- check A1C  GERD with asthma induced GERD- check Hpylori- given diet stuff if it does not help get on Prilosec OTC TMJ- info given and will give baclofen PRN for night.  Discussed med's effects and SE's. Screening labs and tests as requested with regular follow-up as recommended.   Quentin Mulling 2:14 PM

## 2013-02-06 NOTE — Patient Instructions (Signed)
What is the TMJ? The temporomandibular (tem-PUH-ro-man-DIB-yoo-ler) joint, or the TMJ, connects the upper and lower jawbones. This joint allows the jaw to open wide and move back and forth when you chew, talk, or yawn.There are also several muscles that help this joint move. There can be muscle tightness and pain in the muscle that can cause several symptoms.  What causes TMJ pain? There are many causes of TMJ pain. Repeated chewing (for example, chewing gum) and clenching your teeth can cause pain in the joint. Some TMJ pain has no obvious cause. What can I do to ease the pain? There are many things you can do to help your pain get better. When you have pain:  Eat soft foods and stay away from chewy foods (for example, taffy) Try to use both sides of your mouth to chew Don't chew gum Don't open your mouth wide (for example, during yawning or singing) Don't bite your cheeks or fingernails Lower your amount of stress and worry Applying a warm, damp washcloth to the joint may help. Over-the-counter pain medicines such as ibuprofen (one brand: Advil) or acetaminophen (one brand: Tylenol) might also help. Do not use these medicines if you are allergic to them or if your doctor told you not to use them. How can I stop the pain from coming back? When your pain is better, you can do these exercises to make your muscles stronger and to keep the pain from coming back:  Resisted mouth opening: Place your thumb or two fingers under your chin and open your mouth slowly, pushing up lightly on your chin with your thumb. Hold for three to six seconds. Close your mouth slowly. Resisted mouth closing: Place your thumbs under your chin and your two index fingers on the ridge between your mouth and the bottom of your chin. Push down lightly on your chin as you close your mouth. Tongue up: Slowly open and close your mouth while keeping the tongue touching the roof of the mouth. Side-to-side jaw movement: Place an  object about one fourth of an inch thick (for example, two tongue depressors) between your front teeth. Slowly move your jaw from side to side. Increase the thickness of the object as the exercise becomes easier Forward jaw movement: Place an object about one fourth of an inch thick between your front teeth and move the bottom jaw forward so that the bottom teeth are in front of the top teeth. Increase the thickness of the object as the exercise becomes easier. These exercises should not be painful. If it hurts to do these exercises, stop doing them and talk to your family doctor.   The majority of colds are caused by viruses and do not require antibiotics. Please read the rest of this hand out to learn more about the common cold and what you can do to help yourself as well as help prevent the over use of antibiotics.   COMMON COLD SIGNS AND SYMPTOMS - The common cold usually causes nasal congestion, runny nose, and sneezing. A sore throat may be present on the first day but usually resolves quickly. If a cough occurs, it generally develops on about the fourth or fifth day of symptoms, typically when congestion and runny nose are resolving  COMMON COLD COMPLICATIONS - In most cases, colds do not cause serious illness or complications. Most colds last for three to seven days, although many people continue to have symptoms (coughing, sneezing, congestion) for up to two weeks.  One of the   more common complications is sinusitis, which is usually caused by viruses and rarely (about 2 percent of the time) by bacteria. Having thick or yellow to green-colored nasal discharge does not mean that bacterial sinusitis has developed; discolored nasal discharge is a normal phase of the common cold.  Lower respiratory infections, such as pneumonia or bronchitis, may develop following a cold.  Infection of the middle ear, or otitis media, can accompany or follow a cold.  COMMON COLD TREATMENT - There is no specific  treatment for the viruses that cause the common cold. Most treatments are aimed at relieving some of the symptoms of the cold, but do not shorten or cure the cold. Antibiotics are not useful for treating the common cold; antibiotics are only used to treat illnesses caused by bacteria, not viruses. Unnecessary use of antibiotics for the treatment of the common cold can cause allergic reactions, diarrhea, or other gastrointestinal symptoms in some patients.  The symptoms of a cold will resolve over time, even without any treatment. People with underlying medical conditions and those who use other over-the-counter or prescription medications should speak with their healthcare provider or pharmacist to ensure that it is safe to use these treatments. The following are treatments that may reduce the symptoms caused by the common cold.  Nasal congestion - Decongestants are good for nasal congestion- if you feel very stuffy but no mucus is coming out, this is the medication that will help you the most.  Pseudoephedrine is a decongestant that can improve nasal congestion. Although a prescription is not required, drugstores in the [place] keep pseudoephedrine behind the counter, so it must be requested from a pharmacist. If you have a heart condition or high blood pressure please use Coricidin BPH instead.   Runny nose - Antihistamines such as diphenhydramine (Benadryl), certazine (Zyrtec) which are best taking at night because they can make you tired OR loratadine (Claritin),  fexafinadine (Allegra) help with a runny nose.   Nasal sprays such an oxymetazoline (Afrin and others) may also give temporary relief of nasal congestion. However, these sprays should never be used for more than two to three days; use for more than three days use can worsen congestion.  Nasocort is now over the counter and can help decrease a runny nose. Please stop the medication if you have blurry vision or nose bleeds.   Sore throat  and headache - Sore throat and headache are best treated with a mild pain reliever such as acetaminophen (Tylenol) or a non-steroidal anti-inflammatory agent such as ibuprofen or naproxen (Motrin or Aleve). These medications should be taken with food to prevent stomach problems. As well as gargling with warm water and salt.   Cough - Common cough medicine ingredients include guaifenesin and dextromethorphan; these are often combined with other medications in over-the-counter cold formulas. Often a cough is worse at night or first in the morning due to post nasal drip from you nose. You can try to sleep at an angle to decrease a cough.   Alternative treatments - Heated, humidified air can improve symptoms of nasal congestion and runny nose, and causes few to no side effects. A number of alternative products, including vitamin C, doubling up on your vitamin D and herbal products such as echinacea, may help. Certain products, such as nasal gels that contain zinc (eg, Zicam), have been associated with a permanent loss of smell.  Antibiotics - Antibiotics should not be used to treat an uncomplicated common cold. As noted above, colds   above, colds are caused by viruses. Antibiotics treat bacterial, not viral infections. Some viruses that cause the common cold can also depress the immune system or cause swelling in the lining of the nose or airways; this can, in turn, lead to a bacterial infection. Often you need to give your body 7 days to fight off a common cold while treating the symptoms with the medications listed above. If after 7 days your symptoms are not improving, you are getting worse, you have shortness of breath, chest pain, a fever of over 103 you should seek medical help immediately.   PREVENTION IS THE BEST MEDICINE - Hand washing is an essential and highly effective way to prevent the spread of infection.  Alcohol-based hand rubs are a good alternative for disinfecting hands if a sink is not available.   Hands should be washed before preparing food and eating and after coughing, blowing the nose, or sneezing. While it is not always possible to limit contact with people who may be infected with a cold, touching the eyes, nose, or mouth after direct contact should be avoided when possible. Sneezing/coughing into the sleeve of one's clothing (at the inner elbow) is another means of containing sprays of saliva and secretions and does not contaminate the hands.    Diet for Gastroesophageal Reflux Disease, Adult Reflux (acid reflux) is when acid from your stomach flows up into the esophagus. When acid comes in contact with the esophagus, the acid causes irritation and soreness (inflammation) in the esophagus. When reflux happens often or so severely that it causes damage to the esophagus, it is called gastroesophageal reflux disease (GERD). Nutrition therapy can help ease the discomfort of GERD. FOODS OR DRINKS TO AVOID OR LIMIT  Smoking or chewing tobacco. Nicotine is one of the most potent stimulants to acid production in the gastrointestinal tract.  Caffeinated and decaffeinated coffee and black tea.  Regular or low-calorie carbonated beverages or energy drinks (caffeine-free carbonated beverages are allowed).   Strong spices, such as black pepper, white pepper, red pepper, cayenne, curry powder, and chili powder.  Peppermint or spearmint.  Chocolate.  High-fat foods, including meats and fried foods. Extra added fats including oils, butter, salad dressings, and nuts. Limit these to less than 8 tsp per day.  Fruits and vegetables if they are not tolerated, such as citrus fruits or tomatoes.  Alcohol.  Any food that seems to aggravate your condition. If you have questions regarding your diet, call your caregiver or a registered dietitian. OTHER THINGS THAT MAY HELP GERD INCLUDE:   Eating your meals slowly, in a relaxed setting.  Eating 5 to 6 small meals per day instead of 3 large  meals.  Eliminating food for a period of time if it causes distress.  Not lying down until 3 hours after eating a meal.  Keeping the head of your bed raised 6 to 9 inches (15 to 23 cm) by using a foam wedge or blocks under the legs of the bed. Lying flat may make symptoms worse.  Being physically active. Weight loss may be helpful in reducing reflux in overweight or obese adults.  Wear loose fitting clothing EXAMPLE MEAL PLAN This meal plan is approximately 2,000 calories based on https://www.[facility].org/ meal planning guidelines. Breakfast   cup cooked oatmeal.  1 cup strawberries.  1 cup low-fat milk.  1 oz almonds. Snack  1 cup cucumber slices.  6 oz yogurt (made from low-fat or fat-free milk). Lunch  2 slice whole-wheat bread.  2  oz sliced [place].  2 tsp mayonnaise.  1 cup blueberries.  1 cup snap peas. Snack  6 whole-wheat crackers.  1 oz string cheese. Dinner   cup brown rice.  1 cup mixed veggies.  1 tsp olive oil.  3 oz grilled fish. Document Released: 02/09/2005 Document Revised: 05/04/2011 Document Reviewed: 12/26/2010 [facility] Patient Information 2014 [place], [place].

## 2013-02-07 LAB — URINALYSIS, ROUTINE W REFLEX MICROSCOPIC
Bilirubin Urine: NEGATIVE
Glucose, UA: NEGATIVE mg/dL
Hgb urine dipstick: NEGATIVE
Ketones, ur: NEGATIVE mg/dL
Leukocytes, UA: NEGATIVE
Nitrite: NEGATIVE
Protein, ur: NEGATIVE mg/dL
Specific Gravity, Urine: 1.008 (ref 1.005–1.030)
Urobilinogen, UA: 0.2 mg/dL (ref 0.0–1.0)
pH: 5 (ref 5.0–8.0)

## 2013-02-07 LAB — MICROALBUMIN / CREATININE URINE RATIO
Creatinine, Urine: 62.3 mg/dL
Microalb Creat Ratio: 8 mg/g (ref 0.0–30.0)
Microalb, Ur: 0.5 mg/dL (ref 0.00–1.89)

## 2013-02-07 LAB — INSULIN, FASTING: Insulin fasting, serum: 15 u[IU]/mL (ref 3–28)

## 2013-02-07 LAB — VITAMIN D 25 HYDROXY (VIT D DEFICIENCY, FRACTURES): Vit D, 25-Hydroxy: 70 ng/mL (ref 30–89)

## 2013-02-07 LAB — HELICOBACTER PYLORI ABS-IGG+IGA, BLD
H Pylori IgG: 0.41 {ISR}
HELICOBACTER PYLORI AB, IGA: 3.5 U/mL (ref <9.0)

## 2013-04-06 ENCOUNTER — Other Ambulatory Visit: Payer: Self-pay | Admitting: Physician Assistant

## 2013-04-10 ENCOUNTER — Ambulatory Visit (INDEPENDENT_AMBULATORY_CARE_PROVIDER_SITE_OTHER): Payer: BC Managed Care – PPO | Admitting: Family Medicine

## 2013-04-10 ENCOUNTER — Other Ambulatory Visit (INDEPENDENT_AMBULATORY_CARE_PROVIDER_SITE_OTHER): Payer: BC Managed Care – PPO

## 2013-04-10 ENCOUNTER — Encounter: Payer: Self-pay | Admitting: Family Medicine

## 2013-04-10 VITALS — BP 132/86 | HR 76 | Temp 98.7°F | Resp 18 | Wt 267.6 lb

## 2013-04-10 DIAGNOSIS — M23302 Other meniscus derangements, unspecified lateral meniscus, unspecified knee: Secondary | ICD-10-CM

## 2013-04-10 DIAGNOSIS — M23209 Derangement of unspecified meniscus due to old tear or injury, unspecified knee: Secondary | ICD-10-CM

## 2013-04-10 DIAGNOSIS — M25569 Pain in unspecified knee: Secondary | ICD-10-CM

## 2013-04-10 DIAGNOSIS — M25561 Pain in right knee: Secondary | ICD-10-CM

## 2013-04-10 MED ORDER — MELOXICAM 15 MG PO TABS: 15.0000 mg | ORAL_TABLET | Freq: Every day | ORAL | Status: DC

## 2013-04-10 NOTE — Assessment & Plan Note (Signed)
Discussed diagnosis, treatment options as well as rehabilitation. Home exercise program given Meloxicam given Discussed icing protocol Discuss bracing Discussed proper shoe wear Tissue return again in 3 weeks. If she continues to have pain like to do an intra-articular injection into the knee as well as getting baseline x-rays to evaluate for further osteoarthritic changes. Patient does have significant low vitamin D. We discussed other natural supplementations akin be beneficial.

## 2013-04-10 NOTE — Progress Notes (Signed)
Pre-visit discussion using our clinic review tool. No additional management support is needed unless otherwise documented below in the visit note.  

## 2013-04-10 NOTE — Progress Notes (Signed)
[name] [address], [place] [address] Phone: [phone] Subjective:     CC: Knee pain  BPZ:WCHENIDPOE Wendy Levine is a 48 y.o. female coming in with complaint of knee pain. Patient has right knee pain for quite some time. Patient started to exercise more a regular basis and notices when she is doing the rolling machine she has significant pain mostly on the medial aspect of her knee. Patient denies any clicking or popping but states that sometimes it feels like her knee may give out on her. Patient states that she does not notice any swelling but did have a fall recently which seemed to first remove all pain in and started to increase pain again. Patient is a tried in the exercises but has taken a week off from the gym which has been helpful. Patient is able to ambulate without any significant pain but unfortunately when she squats and tries to get up she has significant discomfort mostly on the medial aspect of the knee and she describes it as a sharp pain. Patient is a severity is 7/10.     Past medical history, social, surgical and family history all reviewed in electronic medical record.   Review of Systems: No headache, visual changes, nausea, vomiting, diarrhea, constipation, dizziness, abdominal pain, skin rash, fevers, chills, night sweats, weight loss, swollen lymph nodes, body aches, joint swelling, muscle aches, chest pain, shortness of breath, mood changes.   Objective Blood pressure 132/86, pulse 76, temperature 98.7 F (37.1 C), temperature source Oral, resp. rate 18, weight 267 lb 9.6 oz (121.383 kg), SpO2 98.00%.  General: No apparent distress alert and oriented x3 mood and affect normal, dressed appropriately.  HEENT: Pupils equal, extraocular movements intact  Respiratory: Patient's speak in full sentences and does not appear short of breath  Cardiovascular: No lower extremity edema, non tender, no erythema  Skin: Warm  dry intact with no signs of infection or rash on extremities or on axial skeleton.  Abdomen: Soft nontender  Neuro: Cranial nerves II through XII are intact, neurovascularly intact in all extremities with 2+ DTRs and 2+ pulses.  Lymph: No lymphadenopathy of posterior or anterior cervical chain or axillae bilaterally.  Gait normal with good balance and coordination.  MSK:  Non tender with full range of motion and good stability and symmetric strength and tone of shoulders, elbows, wrist, hip, and ankles bilaterally.  Knee: Right Normal to inspection with no erythema or effusion or obvious bony abnormalities. Patient does have some mild prepatellar bursitis Palpation normal with no warmth patellar tenderness, or condyle tenderness. Patient though does have tenderness to palpation over the medial joint line. ROM full in flexion and extension and lower leg rotation. Ligaments with solid consistent endpoints including ACL, PCL, LCL, MCL. Equivocal Mcmurray's, Apley's, and Thessalonian tests. painful patellar compression. Patellar glide with moderate crepitus. Patellar and quadriceps tendons unremarkable. Hamstring and quadriceps strength is normal.  Contralateral knee unremarkable  MSK US performed of: Right knee This study was ordered, performed, and interpreted by Charlann Boxer D.O.  Knee: All structures visualized. Anteromedial meniscus does have some displacement with chronic changes. No true acute tear appreciated. Patient's medial joint line and does have narrowing with spurring formation. Anterolateral, posteromedial, and posterolateral menisci unremarkable without tearing, fraying, effusion, or displacement. Patellar Tendon unremarkable on long and transverse views without effusion. No abnormality of prepatellar bursa. LCL and MCL unremarkable on long and transverse views. No abnormality of origin of medial or lateral head  of the gastrocnemius.  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    Impression and Recommendations:     This case required medical decision making of moderate complexity.

## 2013-04-10 NOTE — Patient Instructions (Signed)
Very nice to meet you For you knee I think a chronic meniscal tear.  Exercises most days of the week.  Check your bike seat height and look at your toes at 90 degrees No squatting greater then 65 degrees until I see you again.  Body helix size large Ice 20 minutes 2 times a day Meloxicam daily for10 days Turmenric 500mg  twice daily.  Glucosamine 1500mg  daily.  Tylenol 650 mg three times a day.  Come back in 3 weeks.

## 2013-04-28 ENCOUNTER — Other Ambulatory Visit (INDEPENDENT_AMBULATORY_CARE_PROVIDER_SITE_OTHER): Payer: BC Managed Care – PPO

## 2013-04-28 DIAGNOSIS — Z1212 Encounter for screening for malignant neoplasm of rectum: Secondary | ICD-10-CM

## 2013-04-28 LAB — POC HEMOCCULT BLD/STL (HOME/3-CARD/SCREEN)
Card #2 Fecal Occult Blod, POC: NEGATIVE
Card #3 Fecal Occult Blood, POC: NEGATIVE
Fecal Occult Blood, POC: NEGATIVE

## 2013-04-30 ENCOUNTER — Other Ambulatory Visit: Payer: Self-pay | Admitting: Physician Assistant

## 2013-05-03 ENCOUNTER — Ambulatory Visit (INDEPENDENT_AMBULATORY_CARE_PROVIDER_SITE_OTHER): Payer: BC Managed Care – PPO | Admitting: Family Medicine

## 2013-05-03 ENCOUNTER — Encounter: Payer: Self-pay | Admitting: Family Medicine

## 2013-05-03 VITALS — BP 130/74 | HR 74 | Temp 97.8°F | Resp 18 | Wt 265.8 lb

## 2013-05-03 DIAGNOSIS — M23302 Other meniscus derangements, unspecified lateral meniscus, unspecified knee: Secondary | ICD-10-CM

## 2013-05-03 DIAGNOSIS — M23209 Derangement of unspecified meniscus due to old tear or injury, unspecified knee: Secondary | ICD-10-CM

## 2013-05-03 NOTE — Patient Instructions (Signed)
Good to see you You are doing great.  Continue all the medications.  Ice at end of long day Start new exercises for your ankles which will help With long walking wear tennis shoes when you can.  For your knee, can start more activity still would avoid squatting greater then 65 degrees and running or jumping on hard surfaces. Otherwise enjoy See me after skiing.

## 2013-05-03 NOTE — Progress Notes (Signed)
Pre visit review using our clinic review tool, if applicable. No additional management support is needed unless otherwise documented below in the visit note. 

## 2013-05-03 NOTE — Progress Notes (Signed)
  [name] [place],  [address] Phone: [phone] Subjective:     CC: Knee pain follow up  [id number] Wendy Levine is a 48 y.o. female coming in for followup of right knee pain. 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 Patient has been doing exercises and overall has been doing significantly better. He should states that she is about 80% better. Patient still notices some discomfort when she is doing deep squats but overall much improved. Patient states that the over-the-counter medications have been beneficial. Denies any radiation or any new symptoms. Patient has been sleeping comfortably.     Past medical history, social, surgical and family history all reviewed in electronic medical record.   Review of Systems: No headache, visual changes, nausea, vomiting, diarrhea, constipation, dizziness, abdominal pain, skin rash, fevers, chills, night sweats, weight loss, swollen lymph nodes, body aches, joint swelling, muscle aches, chest pain, shortness of breath, mood changes.   Objective Blood pressure 130/74, pulse 74, temperature 97.8 F (36.6 C), temperature source Oral, resp. rate 18, weight 265 lb 12.8 oz (120.566 kg), SpO2 97.00%.  General: No apparent distress alert and oriented x3 mood and affect normal, dressed appropriately.  HEENT: Pupils equal, extraocular movements intact  Respiratory: Patient's speak in full sentences and does not appear short of breath  Cardiovascular: No lower extremity edema, non tender, no erythema  Skin: Warm dry intact with no signs of infection or rash on extremities or on axial skeleton.  Abdomen: Soft nontender  Neuro: Cranial nerves II through XII are intact, neurovascularly intact in all extremities with 2+ DTRs and 2+ pulses.  Lymph: No lymphadenopathy of posterior or anterior cervical chain or axillae bilaterally.    Gait normal with good balance and coordination.  MSK:  Non tender with full range of motion and good stability and symmetric strength and tone of shoulders, elbows, wrist, hip, and ankles bilaterally.  Knee: Right Normal to inspection with no erythema or effusion or obvious bony abnormalities. Patient does have some mild prepatellar bursitis Palpation normal with no warmth patellar tenderness, or condyle tenderness. Patient though does have tenderness to palpation over the medial joint line. ROM full in flexion and extension and lower leg rotation. Ligaments with solid consistent endpoints including ACL, PCL, LCL, MCL. Mild positive Mcmurray's, Apley's, and Thessalonian tests. painful patellar compression. Patellar glide with moderate crepitus. Patellar and quadriceps tendons unremarkable. Hamstring and quadriceps strength is normal.  Contralateral knee unremarkable      Impression and Recommendations:     This case required medical decision making of moderate complexity.

## 2013-05-03 NOTE — Assessment & Plan Note (Signed)
Complete patient's pain is likely secondary more to the underlying arthritis and a meniscal tear. Encourage her to continue the exercises 3 times a week to increase the activity as tolerated. Patient will continue the over-the-counter medications or syncope beneficial and do icing after activity. Patient has any exacerbation she'll come back again at that time we can consider doing an injection.

## 2013-05-04 ENCOUNTER — Ambulatory Visit (INDEPENDENT_AMBULATORY_CARE_PROVIDER_SITE_OTHER): Payer: BC Managed Care – PPO | Admitting: Physician Assistant

## 2013-05-04 ENCOUNTER — Encounter: Payer: Self-pay | Admitting: Physician Assistant

## 2013-05-04 VITALS — BP 138/84 | HR 68 | Temp 97.9°F | Resp 16 | Ht 65.0 in | Wt 262.0 lb

## 2013-05-04 DIAGNOSIS — H5500 Unspecified nystagmus: Secondary | ICD-10-CM

## 2013-05-04 DIAGNOSIS — R5381 Other malaise: Secondary | ICD-10-CM

## 2013-05-04 DIAGNOSIS — R5383 Other fatigue: Secondary | ICD-10-CM

## 2013-05-04 DIAGNOSIS — R635 Abnormal weight gain: Secondary | ICD-10-CM

## 2013-05-04 DIAGNOSIS — R51 Headache: Secondary | ICD-10-CM

## 2013-05-04 LAB — T4, FREE: Free T4: 0.87 ng/dL (ref 0.80–1.80)

## 2013-05-04 LAB — TSH: TSH: 2.247 u[IU]/mL (ref 0.350–4.500)

## 2013-05-04 LAB — T3, FREE: T3, Free: 3.1 pg/mL (ref 2.3–4.2)

## 2013-05-04 MED ORDER — PROMETHAZINE HCL 25 MG PO TABS: 25.0000 mg | ORAL_TABLET | Freq: Four times a day (QID) | ORAL | Status: DC | PRN

## 2013-05-04 MED ORDER — PHENTERMINE HCL 37.5 MG PO TABS
37.5000 mg | ORAL_TABLET | Freq: Every day | ORAL | Status: DC
Start: 2013-05-04 — End: 2013-11-10

## 2013-05-04 NOTE — Patient Instructions (Addendum)
Obesity Obesity is defined as having too much total body fat and a body mass index (BMI) of 30 or more. BMI is an estimate of body fat and is calculated from your height and weight. Obesity happens when you consume more calories than you can burn by exercising or performing daily physical tasks. Prolonged obesity can cause major illnesses or emergencies, such as:   A stroke.  Heart disease.  Diabetes.  Cancer.  Arthritis.  High blood pressure (hypertension).  High cholesterol.  Sleep apnea.  Erectile dysfunction.  Infertility problems. CAUSES   Regularly eating unhealthy foods.  Physical inactivity.  Certain disorders, such as an underactive thyroid (hypothyroidism), Cushing's syndrome, and polycystic ovarian syndrome.  Certain medicines, such as steroids, some depression medicines, and antipsychotics.  Genetics.  Lack of sleep. DIAGNOSIS  A caregiver can diagnose obesity after calculating your BMI. Obesity will be diagnosed if your BMI is 30 or higher.  There are other methods of measuring obesity levels. Some other methods include measuring your skin fold thickness, your waist circumference, and comparing your hip circumference to your waist circumference. TREATMENT  A healthy treatment program includes some or all of the following:  Long-term dietary changes.  Exercise and physical activity.  Behavioral and lifestyle changes.  Medicine only under the supervision of your caregiver. Medicines may help, but only if they are used with diet and exercise programs. An unhealthy treatment program includes:  Fasting.  Fad diets.  Supplements and drugs. These choices do not succeed in long-term weight control.  HOME CARE INSTRUCTIONS   Exercise and perform physical activity as directed by your caregiver. To increase physical activity, try the following:  Use stairs instead of elevators.  Park farther away from store entrances.  Garden, bike, or walk instead of  watching television or using the computer.  Eat healthy, low-calorie foods and drinks on a regular basis. Eat more fruits and vegetables. Use low-calorie cookbooks or take healthy cooking classes.  Limit fast food, sweets, and processed snack foods.  Eat smaller portions.  Keep a daily journal of everything you eat. There are many free websites to help you with this. It may be helpful to measure your foods so you can determine if you are eating the correct portion sizes.  Avoid drinking alcohol. Drink more water and drinks without calories.  Take vitamins and supplements only as recommended by your caregiver.  Weight-loss support groups, Nurse, mental health, counselors, and stress reduction education can also be very helpful. SEEK IMMEDIATE MEDICAL CARE IF:  You have chest pain or tightness.  You have trouble breathing or feel short of breath.  You have weakness or leg numbness.  You feel confused or have trouble talking.  You have sudden changes in your vision. MAKE SURE YOU:  Understand these instructions.  Will watch your condition.  Will get help right away if you are not doing well or get worse. Document Released: 03/19/2004 Document Revised: 08/11/2011 Document Reviewed: 03/18/2011 [facility] Patient Information 2014 [place]. Phentermine  While taking the medication we will ask that you come into the office once a month to monitor your weight, blood pressure, and heart rate. In addition we can help answer your questions about diet, exercise, and help you every step of the way with your weight loss journey. Sometime it is helpful if you bring in a food diary or use an app on your phone such as myfitnesspal to record your calorie intake, especially in the beginning.   You can start out on  1/3 to 1/2 a pill in the morning and if you are tolerating it well you can increase to one pill daily.   What is this medicine? PHENTERMINE (FEN ter meen) decreases your  appetite. This medicine is intended to be used in addition to a healthy reduced calorie diet and exercise. The best results are achieved this way. This medicine is only indicated for short-term use. Eventually your weight loss may level out and the medication will no longer be needed.   How should I use this medicine? Take this medicine by mouth. Follow the directions on the prescription label. The tablets should stay in the bottle until immediately before you take your dose. Take your doses at regular intervals. Do not take your medicine more often than directed.  Overdosage: If you think you have taken too much of this medicine contact a poison control center or emergency room at once. NOTE: This medicine is only for you. Do not share this medicine with others.  What if I miss a dose? If you miss a dose, take it as soon as you can. If it is almost time for your next dose, take only that dose. Do not take double or extra doses. Do not increase or in any way change your dose without consulting your doctor.  What should I watch for while using this medicine? Notify your physician immediately if you become short of breath while doing your normal activities. Do not take this medicine within 6 hours of bedtime. It can keep you from getting to sleep. Avoid drinks that contain caffeine and try to stick to a regular bedtime every night. Do not stand or sit up quickly, especially if you are an older patient. This reduces the risk of dizzy or fainting spells. Avoid alcoholic drinks.  What side effects may I notice from receiving this medicine? Side effects that you should report to your doctor or health care professional as soon as possible: -chest pain, palpitations -depression or severe changes in mood -increased blood pressure -irritability -nervousness or restlessness -severe dizziness -shortness of breath -problems urinating -unusual swelling of the legs -vomiting  Side effects that usually do  not require medical attention (report to your doctor or health care professional if they continue or are bothersome): -blurred vision or other eye problems -changes in sexual ability or desire -constipation or diarrhea -difficulty sleeping -dry mouth or unpleasant taste -headache -nausea This list may not describe all possible side effects. Call your doctor for medical advice about side effects. You may report side effects to FDA at 1-800-FDA-1088.

## 2013-05-04 NOTE — Progress Notes (Signed)
48 y.o.female presents for a follow up.Her Body mass index is 43.6 kg/(m^2).Marland Kitchen She states she has been working very hard on dieting and is still unable to lose weight. She has been working out 55mins cardio and doing 10 mins weight training.   Wt Readings from Last 3 Encounters:  05/04/13 262 lb (118.842 kg)  05/03/13 265 lb 12.8 oz (120.566 kg)  04/10/13 267 lb 9.6 oz (121.383 kg)   Typical breakfast: has 100 calorie greek yoplait with walnuts 1-2 TBSP Typical lunch: Salads- meat- cheese 1/4 feta and dressing- vinegar and made Typical dinner: Salads same as above Snack: handful of walnuts, piece of fruit,  Stopped alcohol, no other calorie drinks, drinks at least 64 oz daily  She has a history of irregular periods, headaches, some blurry vision over past year, history of abnormal prolactin level. Constipation.   Medications: Current Outpatient Prescriptions on File Prior to Visit  Medication Sig Dispense Refill  . ALPRAZolam (XANAX) 0.5 MG tablet Take 1 tablet (0.5 mg total) by mouth 2 (two) times daily as needed.  60 tablet  0  . baclofen (LIORESAL) 10 MG tablet Take 1 tablet (10 mg total) by mouth 2 (two) times daily.  60 tablet  1  . CRESTOR 10 MG tablet TAKE ONE TABLET BY MOUTH DAILY  30 tablet  2  . ergocalciferol (VITAMIN D2) 50000 UNITS capsule Take 50,000 Units by mouth once a week. Twice weekly      . escitalopram (LEXAPRO) 10 MG tablet TAKE ONE TABLET BY MOUTH DAILY  90 tablet  1  . fluconazole (DIFLUCAN) 150 MG tablet TAKE ONE TABLET BY MOUTH ONCE  3 tablet  0  . hydrochlorothiazide (HYDRODIURIL) 25 MG tablet TAKE ONE TABLET BY MOUTH DAILY  90 tablet  0  . IRON PO Take by mouth daily.      Marland Kitchen MAGNESIUM PO Take by mouth.        . meloxicam (MOBIC) 15 MG tablet Take 1 tablet (15 mg total) by mouth daily.  30 tablet  0  . NONFORMULARY OR COMPOUNDED ITEM Boric acid supp 600 mg one per vagina twice weekly.  30 each  3  . nystatin-triamcinolone ointment (MYCOLOG) Apply topically 2  (two) times daily.  30 g  2  . OnabotulinumtoxinA (BOTOX IM) Inject into the muscle. 15-20 every 5 months      . traMADol (ULTRAM) 50 MG tablet Take 50 mg by mouth every 6 (six) hours as needed.      . valACYclovir (VALTREX) 500 MG tablet TAKE 1 TABLET (500 MG TOTAL) BY MOUTH DAILY.  30 tablet  10   No current facility-administered medications on file prior to visit.    ROS: All negative except for above  Physical exam: Filed Vitals:   05/04/13 1153  BP: 138/84  Pulse: 68  Temp: 97.9 F (36.6 C)  Resp: 16   BP 138/84  Pulse 68  Temp(Src) 97.9 F (36.6 C)  Resp 16  Ht 5\' 5"  (1.651 m)  Wt 262 lb (118.842 kg)  BMI 43.60 kg/m2 General appearance: alert, no distress and morbidly obese Head: Normocephalic, without obvious abnormality, atraumatic Eyes: positive findings: nystagmus with looking to the right and left upper field Ears: normal TM's and external ear canals both ears Throat: lips, mucosa, and tongue normal; teeth and gums normal Neck: no adenopathy, no carotid bruit, no JVD, supple, symmetrical, trachea midline and thyroid not enlarged, symmetric, no tenderness/mass/nodules Lungs: clear to auscultation bilaterally Heart: regular rate and rhythm, S1,  S2 normal, no murmur, click, rub or gallop Abdomen: soft, non-tender; bowel sounds normal; no masses,  no organomegaly Skin: Skin color, texture, turgor normal. No rashes or lesions Neurologic: Cranial nerves: normal Coordination: horizontal nystagmus with gaze to the right, quick component to the right Gait: Normal  Assessment: Obesity with co morbid conditions.   Plan: General weight loss/lifestyle modification strategies discussed (elicit support from others; identify saboteurs; non-food rewards, etc). Behavioral treatment: stress management. Diet interventions: diet diary indefinitely. Informal exercise measures discussed, e.g. taking stairs instead of elevator. Regular aerobic exercise program  discussed.  Patient is being very diligent with her diet and exercise per her documentation but continues to have problems with losing weight. Will check cortisol, prolactin and T3/T4/TSH due to history.  Phentermine 37.5 start 1/2

## 2013-05-05 LAB — PROLACTIN: Prolactin: 4.4 ng/mL

## 2013-05-05 LAB — TESTOSTERONE: Testosterone: 31 ng/dL (ref 10–70)

## 2013-05-05 LAB — CORTISOL: Cortisol, Plasma: 11.3 ug/dL

## 2013-05-05 MED ORDER — LEVOTHYROXINE SODIUM 50 MCG PO TABS: ORAL_TABLET | ORAL | Status: DC

## 2013-05-05 NOTE — Addendum Note (Signed)
Addended by: Vicie Mutters R on: 05/05/2013 08:25 AM   Modules accepted: Orders

## 2013-05-15 ENCOUNTER — Encounter: Payer: Self-pay | Admitting: Family Medicine

## 2013-05-15 ENCOUNTER — Ambulatory Visit (INDEPENDENT_AMBULATORY_CARE_PROVIDER_SITE_OTHER): Payer: BC Managed Care – PPO | Admitting: Family Medicine

## 2013-05-15 VITALS — BP 126/84 | HR 74

## 2013-05-15 DIAGNOSIS — M23209 Derangement of unspecified meniscus due to old tear or injury, unspecified knee: Secondary | ICD-10-CM

## 2013-05-15 DIAGNOSIS — M23302 Other meniscus derangements, unspecified lateral meniscus, unspecified knee: Secondary | ICD-10-CM

## 2013-05-15 NOTE — Assessment & Plan Note (Signed)
Patient continues to have trouble and is having some mild mechanical symptoms. I like to be more aggressive and patient was given an injection into the knee today with good resolution of pain. Patient was also fitted with her brace and was given to patient today. Patient will continue home exercises and declines formal physical therapy because she is leaving town again for an extended amount of time. Patient also has flatfeet bilaterally and may benefit from orthotics. Patient may do this at followup.  Spent greater than 25 minutes with patient face-to-face and had greater than 50% of counseling including as described above in assessment and plan.

## 2013-05-15 NOTE — Patient Instructions (Signed)
Good to see you Continue the exercises 3 times a week Continue meds and ice Add tylenol 650mg  three time daily Wear brace with activity.  Come back in April and we can do orthotics (30 MINUTE APPOINTMENT) Have a great trip

## 2013-05-15 NOTE — Progress Notes (Signed)
  [name] [place], [place] [address] Phone: [phone] Subjective:     CC: Knee pain follow up  [id number] Wendy Levine is a 48 y.o. female coming in for follow up of right knee pain. Patient was seen previously and does have osteoarthritic changes of the knee on x-ray and ultrasound. Patient was trying conservative therapy will with her recent trip she continued to have worsening pain mostly on the medial aspect of his knee that seems to radiate posteriorly. Patient states it can even have a dull aching pain with sitting . Patient denies any weakness or numbness in the leg.     Past medical history, social, surgical and family history all reviewed in electronic medical record.   Review of Systems: No headache, visual changes, nausea, vomiting, diarrhea, constipation, dizziness, abdominal pain, skin rash, fevers, chills, night sweats, weight loss, swollen lymph nodes, body aches, joint swelling, muscle aches, chest pain, shortness of breath, mood changes.   Objective Blood pressure 126/84, pulse 74, SpO2 98.00%.  General: No apparent distress alert and oriented x3 mood and affect normal, dressed appropriately.  HEENT: Pupils equal, extraocular movements intact  Respiratory: Patient's speak in full sentences and does not appear short of breath  Cardiovascular: No lower extremity edema, non tender, no erythema  Skin: Warm dry intact with no signs of infection or rash on extremities or on axial skeleton.  Abdomen: Soft nontender  Neuro: Cranial nerves II through XII are intact, neurovascularly intact in all extremities with 2+ DTRs and 2+ pulses.  Lymph: No lymphadenopathy of posterior or anterior cervical chain or axillae bilaterally.  Gait normal with good balance and coordination.  MSK:  Non tender with full range of motion and good stability and symmetric strength and tone of shoulders, elbows, wrist, hip, and ankles bilaterally.   Knee: Right Normal to inspection with no erythema or effusion or obvious bony abnormalities. Patient does have some mild prepatellar bursitis Palpation normal with no warmth patellar tenderness, or condyle tenderness. Continue tenderness over the medial joint line ROM full in flexion and extension and lower leg rotation. Ligaments with solid consistent endpoints including ACL, PCL, LCL, MCL.  positive Mcmurray's, Apley's, and Thessalonian tests. painful patellar compression. Patellar glide with moderate crepitus. Patellar and quadriceps tendons unremarkable. Hamstring and quadriceps strength is normal.  Contralateral knee unremarkable  After informed written and verbal consent, patient was seated on exam table. Right knee was prepped with alcohol swab and utilizing anterolateral approach, patient's right knee space was injected with 4:1  marcaine 0.5%: Kenalog 40mg /dL. Patient tolerated the procedure well without immediate complications.      Impression and Recommendations:     This case required medical decision making of moderate complexity.

## 2013-05-17 ENCOUNTER — Other Ambulatory Visit: Payer: Self-pay | Admitting: Physician Assistant

## 2013-05-22 ENCOUNTER — Telehealth: Payer: Self-pay

## 2013-05-22 ENCOUNTER — Ambulatory Visit (INDEPENDENT_AMBULATORY_CARE_PROVIDER_SITE_OTHER)
Admission: RE | Admit: 2013-05-22 | Discharge: 2013-05-22 | Disposition: A | Discharge status: Home / Self Care | Payer: BC Managed Care – PPO | Source: Ambulatory Visit | Attending: Family Medicine | Admitting: Family Medicine

## 2013-05-22 DIAGNOSIS — M23302 Other meniscus derangements, unspecified lateral meniscus, unspecified knee: Secondary | ICD-10-CM

## 2013-05-22 DIAGNOSIS — M23209 Derangement of unspecified meniscus due to old tear or injury, unspecified knee: Secondary | ICD-10-CM

## 2013-05-22 NOTE — Telephone Encounter (Signed)
The patient called and is hoping to get an order for an xray due to her knee pain

## 2013-05-22 NOTE — Telephone Encounter (Signed)
Pt made aware order has been place.

## 2013-06-06 ENCOUNTER — Ambulatory Visit: Payer: BC Managed Care – PPO | Admitting: Family Medicine

## 2013-06-07 ENCOUNTER — Ambulatory Visit: Payer: Self-pay | Admitting: Physician Assistant

## 2013-06-20 ENCOUNTER — Encounter: Payer: Self-pay | Admitting: Family Medicine

## 2013-06-20 ENCOUNTER — Ambulatory Visit (INDEPENDENT_AMBULATORY_CARE_PROVIDER_SITE_OTHER): Payer: BC Managed Care – PPO | Admitting: Family Medicine

## 2013-06-20 DIAGNOSIS — M2142 Flat foot [pes planus] (acquired), left foot: Secondary | ICD-10-CM

## 2013-06-20 DIAGNOSIS — M2141 Flat foot [pes planus] (acquired), right foot: Secondary | ICD-10-CM | POA: Insufficient documentation

## 2013-06-20 DIAGNOSIS — M2140 Flat foot [pes planus] (acquired), unspecified foot: Secondary | ICD-10-CM

## 2013-06-20 HISTORY — DX: Flat foot (pes planus) (acquired), right foot: M21.41

## 2013-06-20 HISTORY — DX: Flat foot (pes planus) (acquired), left foot: M21.42

## 2013-06-20 NOTE — Progress Notes (Signed)
  [name] [place],  [address] Phone: [phone] Subjective:     CC: Knee pain follow up  [id number] Wendy Levine is a 48 y.o. female coming in for follow up of right knee pain. Patient was seen previously and does have osteoarthritic changes of the knee on x-ray and ultrasound. Patient is a chronic meniscal tear. Patient has been doing fairly well overall. Patient is here to to get orthotics and further evaluation. Patient does have pes planus I think is contributing to her knee. Patient still complains of some anterior knee pain. Patient states that also at the posterior aspect of her knee has started hurting after she's been doing longer amount times and gardening. Overall though she still able to do all activities daily living and denies any nighttime waking up.     Past medical history, social, surgical and family history all reviewed in electronic medical record.   Review of Systems: No headache, visual changes, nausea, vomiting, diarrhea, constipation, dizziness, abdominal pain, skin rash, fevers, chills, night sweats, weight loss, swollen lymph nodes, body aches, joint swelling, muscle aches, chest pain, shortness of breath, mood changes.   Objective Vitals reviewed   General: No apparent distress alert and oriented x3 mood and affect normal, dressed appropriately.  HEENT: Pupils equal, extraocular movements intact  Respiratory: Patient's speak in full sentences and does not appear short of breath  Cardiovascular: No lower extremity edema, non tender, no erythema  Skin: Warm dry intact with no signs of infection or rash on extremities or on axial skeleton.  Abdomen: Soft nontender  Neuro: Cranial nerves II through XII are intact, neurovascularly intact in all extremities with 2+ DTRs and 2+ pulses.  Lymph: No lymphadenopathy of posterior or anterior cervical chain or axillae bilaterally.  Gait normal with good balance  and coordination.  MSK:  Non tender with full range of motion and good stability and symmetric strength and tone of shoulders, elbows, wrist, hip, and ankles bilaterally.  Knee: Right Normal to inspection with no erythema or effusion or obvious bony abnormalities. Patient does have some mild prepatellar bursitis Palpation normal with no warmth patellar tenderness, or condyle tenderness. Continue tenderness over the medial joint line ROM full in flexion and extension and lower leg rotation. Ligaments with solid consistent endpoints including ACL, PCL, LCL, MCL.  positive Mcmurray's, Apley's, and Thessalonian tests. painful patellar compression. Patellar glide with moderate crepitus. Patellar and quadriceps tendons unremarkable. Hamstring and quadriceps strength is normal.  Contralateral knee unremarkable Foot exam shows the patient does have severe pes planus bilaterally with over pronation of the hindfoot bilaterally right greater than left.   Patient was fitted for a : standard, cushioned, semi-rigid orthotic. The orthotic was heated and afterward the patient stood on the orthotic blank positioned on the orthotic stand. The patient was positioned in subtalar neutral position and 10 degrees of ankle dorsiflexion in a weight bearing stance. After completion of molding, a stable base was applied to the orthotic blank. The blank was ground to a stable position for weight bearing. Size: 7 Base: [employer] and safety inspector and [place]adding: None The patient ambulated these, and they were very comfortable.  I spent 45 minutes with this patient, greater than 50% was face-to-face time counseling regarding the below diagnosis.     Impression and Recommendations:

## 2013-06-20 NOTE — Patient Instructions (Signed)
Good to see you Wear the orthotics 4 hours first day and increase 1 hour daily thereafter.  Ice still to the knee.  Try hamstring exercises 3 times a week.  Ask insurance about synvisc if continues. Series of 3 injections.

## 2013-06-20 NOTE — Assessment & Plan Note (Signed)
Patient does have pes planus feet bilaterally. I do believe that this causing patient had premature breakdown of the medial compartment of the knees bilaterally. Patient does have x-rays findings showing some moderate osteoarthritic changes. Patient told to wear the orthotics slowly increasing over the course of 2 weeks. Discussed continuing exercises for her knee as well as hamstring and calf exercises it can be beneficial. Patient will try these interventions and come back again in 3 weeks for further evaluation. Patient continues to have knee pain we may need to consider Visco supplementation.

## 2013-06-29 ENCOUNTER — Telehealth: Payer: Self-pay | Admitting: Family Medicine

## 2013-06-29 NOTE — Telephone Encounter (Signed)
Patient would like a call back in regards to inserts.  She thought that they would be ready last week.  Can call on cell phone at [phone] or at home number listed.

## 2013-06-30 NOTE — Telephone Encounter (Signed)
Pt made aware they are ready for pickup.

## 2013-07-14 ENCOUNTER — Ambulatory Visit (INDEPENDENT_AMBULATORY_CARE_PROVIDER_SITE_OTHER): Payer: BC Managed Care – PPO | Admitting: Family Medicine

## 2013-07-14 ENCOUNTER — Encounter: Payer: Self-pay | Admitting: Family Medicine

## 2013-07-14 VITALS — BP 128/84 | HR 58 | Ht 66.0 in | Wt 236.0 lb

## 2013-07-14 DIAGNOSIS — M23302 Other meniscus derangements, unspecified lateral meniscus, unspecified knee: Secondary | ICD-10-CM

## 2013-07-14 DIAGNOSIS — M25561 Pain in right knee: Secondary | ICD-10-CM

## 2013-07-14 DIAGNOSIS — M23209 Derangement of unspecified meniscus due to old tear or injury, unspecified knee: Secondary | ICD-10-CM

## 2013-07-14 DIAGNOSIS — M25569 Pain in unspecified knee: Secondary | ICD-10-CM

## 2013-07-14 NOTE — Assessment & Plan Note (Addendum)
Patient has now failed conservative therapy including bracing, icing, home exercises, oral anti-inflammatories, as well as corticoid steroid injection. Patient is also failed formal physical therapy. I do think further imaging is warranted at this time because patient is having mechanical symptoms with locking and catching. Patient will get an MRI of the right knee. We will have patient followup after the MRI to discuss the results. If patient does show mild to moderate arthritis without any significant meniscal tear. In the meantime patient will continue only a conservative therapy at this time and avoid any significant increase in walking. We discussed the difference of a medial unloader brace which patient declined today. We'll have patient come back after MRI and we'll discuss results. All patient's questions were answered today in its entirety. We did also show patient proper form and exercises.

## 2013-07-14 NOTE — Progress Notes (Signed)
  [name] [address] [place], [place] [address] Phone: [phone] Subjective:     CC: Knee pain follow up  [id number] Parker Wendy Levine is a 48 y.o. female coming in for follow up of right knee pain. Patient was seen previously and does have osteoarthritic changes of the knee on x-ray and ultrasound. 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 Patient has been doing conservative therapy and has tried a corticosteroid injection. Patient has also failed physical therapy, bracing, icing, and oral anti-inflammatories. Patient was given custom foot orthotics to help with alignment at last visit. Patient states the orthotics does help her foot pain significantly. Patient unfortunately is continuing to have the knee pain. Patient states that the knee pain is also given her some more and stability which is new. Patient states that it feels like it is going to give out on her when she is going downstairs. Patient also states a twisting sensation does cause more pain. Patient has failed everything we've discussed before. Patient states it is starting to give her significant amount of pain that by midday she is to put her legs up and ice and has been very extreme difficulty with ambulation. Patient denies any swelling.  Patient states though that her left knee is starting to hurt as well and feels fairly similar without the locking or catching pain. Patient is concerned that if she continues to ambulate the way that she is still having worsening pain of her contralateral knee.    Past medical history, social, surgical and family history all reviewed in electronic medical record.   Review of Systems: No headache, visual changes, nausea, vomiting, diarrhea, constipation, dizziness, abdominal pain, skin rash, fevers, chills, night sweats, weight loss, swollen lymph nodes, body aches, joint swelling, muscle aches, chest pain, shortness of breath, mood changes.     Objective Blood pressure 128/84, pulse 58, height 5\' 6"  (1.676 m), weight 236 lb (107.049 kg), SpO2 98.00%.  General: No apparent distress alert and oriented x3 mood and affect normal, dressed appropriately.  HEENT: Pupils equal, extraocular movements intact  Respiratory: Patient's speak in full sentences and does not appear short of breath  Cardiovascular: No lower extremity edema, non tender, no erythema  Skin: Warm dry intact with no signs of infection or rash on extremities or on axial skeleton.  Abdomen: Soft nontender  Neuro: Cranial nerves II through XII are intact, neurovascularly intact in all extremities with 2+ DTRs and 2+ pulses.  Lymph: No lymphadenopathy of posterior or anterior cervical chain or axillae bilaterally.  Gait normal with good balance and coordination.  MSK:  Non tender with full range of motion and good stability and symmetric strength and tone of shoulders, elbows, wrist, hip, and ankles bilaterally.  Knee: Right Normal to inspection with no erythema or effusion or obvious bony abnormalities. Patient does have some mild prepatellar bursitis still present. Palpation normal with no warmth patellar tenderness, or condyle tenderness. Continue tenderness over the medial joint line significantly. ROM full in flexion and extension and lower leg rotation. Ligaments with solid consistent endpoints including ACL, PCL, LCL, MCL.  positive Mcmurray's, Apley's, and Thessalonian tests. painful patellar compression. Patellar glide with moderate crepitus. Patellar and quadriceps tendons unremarkable. Hamstring and quadriceps strength is normal.  Contralateral knee unremarkable       Impression and Recommendations:     This case required medical decision making of moderate complexity.

## 2013-07-14 NOTE — Patient Instructions (Signed)
It is good to see you and happy memorial day! We will get MRI of the right knee to see whats going on.  Tylenol 650mg  three times daily.  Iron 325mg  daily will help the cramps Continue the vitamin D and the turmeric.  Come back 1-2 days after mri and we will discuss.

## 2013-07-19 ENCOUNTER — Ambulatory Visit
Admission: RE | Admit: 2013-07-19 | Discharge: 2013-07-19 | Disposition: A | Discharge status: Home / Self Care | Payer: BC Managed Care – PPO | Source: Ambulatory Visit | Attending: Family Medicine | Admitting: Family Medicine

## 2013-07-19 DIAGNOSIS — M25561 Pain in right knee: Secondary | ICD-10-CM

## 2013-07-19 DIAGNOSIS — M23209 Derangement of unspecified meniscus due to old tear or injury, unspecified knee: Secondary | ICD-10-CM

## 2013-07-20 ENCOUNTER — Telehealth: Payer: Self-pay | Admitting: Family Medicine

## 2013-07-20 DIAGNOSIS — S83206A Unspecified tear of unspecified meniscus, current injury, right knee, initial encounter: Secondary | ICD-10-CM

## 2013-07-20 NOTE — Telephone Encounter (Signed)
Patient was called about MRI results. Patient does have a complex medial meniscal tear of the posterior horn. I do think patient has failed all conservative therapy and at this point the patient's does need surgical intervention. Patient has been referred to either Dr. Berenice Primas or [facility].

## 2013-07-25 NOTE — Telephone Encounter (Signed)
Thanks Zach

## 2013-07-26 ENCOUNTER — Telehealth: Payer: Self-pay

## 2013-07-26 NOTE — Telephone Encounter (Signed)
Terri from [facility] called requesting OV notes, EKG, and lab results. Faxed most recent OV note, EKG, and labs to [phone]

## 2013-08-07 ENCOUNTER — Other Ambulatory Visit: Payer: Self-pay | Admitting: Physician Assistant

## 2013-08-10 ENCOUNTER — Other Ambulatory Visit: Payer: Self-pay | Admitting: Gynecology

## 2013-08-11 ENCOUNTER — Other Ambulatory Visit: Payer: Self-pay | Admitting: Internal Medicine

## 2013-08-11 ENCOUNTER — Other Ambulatory Visit: Payer: Self-pay | Admitting: Physician Assistant

## 2013-08-16 ENCOUNTER — Other Ambulatory Visit: Payer: Self-pay | Admitting: Gynecology

## 2013-08-16 DIAGNOSIS — Z1231 Encounter for screening mammogram for malignant neoplasm of breast: Secondary | ICD-10-CM

## 2013-08-17 ENCOUNTER — Ambulatory Visit (HOSPITAL_COMMUNITY)
Admission: RE | Admit: 2013-08-17 | Discharge: 2013-08-17 | Disposition: A | Discharge status: Home / Self Care | Payer: BC Managed Care – PPO | Source: Ambulatory Visit | Attending: Gynecology | Admitting: Gynecology

## 2013-08-17 DIAGNOSIS — Z1231 Encounter for screening mammogram for malignant neoplasm of breast: Secondary | ICD-10-CM | POA: Insufficient documentation

## 2013-08-22 ENCOUNTER — Other Ambulatory Visit: Payer: Self-pay | Admitting: Physician Assistant

## 2013-08-23 ENCOUNTER — Encounter: Payer: Self-pay | Admitting: Physician Assistant

## 2013-08-23 ENCOUNTER — Ambulatory Visit (INDEPENDENT_AMBULATORY_CARE_PROVIDER_SITE_OTHER): Payer: BC Managed Care – PPO | Admitting: Physician Assistant

## 2013-08-23 VITALS — BP 140/82 | HR 80 | Temp 97.9°F | Resp 16 | Ht 65.0 in | Wt 265.0 lb

## 2013-08-23 DIAGNOSIS — R5383 Other fatigue: Secondary | ICD-10-CM

## 2013-08-23 DIAGNOSIS — N3000 Acute cystitis without hematuria: Secondary | ICD-10-CM

## 2013-08-23 DIAGNOSIS — E559 Vitamin D deficiency, unspecified: Secondary | ICD-10-CM

## 2013-08-23 DIAGNOSIS — R252 Cramp and spasm: Secondary | ICD-10-CM

## 2013-08-23 DIAGNOSIS — E039 Hypothyroidism, unspecified: Secondary | ICD-10-CM

## 2013-08-23 DIAGNOSIS — E7800 Pure hypercholesterolemia, unspecified: Secondary | ICD-10-CM

## 2013-08-23 DIAGNOSIS — I10 Essential (primary) hypertension: Secondary | ICD-10-CM

## 2013-08-23 DIAGNOSIS — H538 Other visual disturbances: Secondary | ICD-10-CM

## 2013-08-23 DIAGNOSIS — R5381 Other malaise: Secondary | ICD-10-CM

## 2013-08-23 LAB — CBC WITH DIFFERENTIAL/PLATELET
Basophils Absolute: 0 10*3/uL (ref 0.0–0.1)
Basophils Relative: 0 % (ref 0–1)
Eosinophils Absolute: 0.2 10*3/uL (ref 0.0–0.7)
Eosinophils Relative: 2 % (ref 0–5)
HCT: 37.5 % (ref 36.0–46.0)
Hemoglobin: 12.4 g/dL (ref 12.0–15.0)
Lymphocytes Relative: 16 % (ref 12–46)
Lymphs Abs: 1.5 10*3/uL (ref 0.7–4.0)
MCH: 27.7 pg (ref 26.0–34.0)
MCHC: 33.1 g/dL (ref 30.0–36.0)
MCV: 83.9 fL (ref 78.0–100.0)
Monocytes Absolute: 0.5 10*3/uL (ref 0.1–1.0)
Monocytes Relative: 5 % (ref 3–12)
Neutro Abs: 7.1 10*3/uL (ref 1.7–7.7)
Neutrophils Relative %: 77 % (ref 43–77)
Platelets: 246 10*3/uL (ref 150–400)
RBC: 4.47 MIL/uL (ref 3.87–5.11)
RDW: 14.8 % (ref 11.5–15.5)
WBC: 9.2 10*3/uL (ref 4.0–10.5)

## 2013-08-23 MED ORDER — AMPHETAMINE-DEXTROAMPHETAMINE 20 MG PO TABS: 20.0000 mg | ORAL_TABLET | Freq: Two times a day (BID) | ORAL | Status: DC

## 2013-08-23 NOTE — Progress Notes (Signed)
Assessment and Plan:  1. Essential hypertension - CBC with Differential - BASIC METABOLIC PANEL WITH GFR - Hepatic function panel  2. Hypercholesteremia - Lipid panel  3. Other malaise and fatigue ? Fibromyalgia/from being off lexapro- get back on lexapro, ? Fibro/fog, can try Adderall 20mg  #60 NR currently, check labs - Vitamin B12 - Magnesium - Iron and TIBC - Ferritin - Folate RBC - ANA - Sedimentation rate  4. Blurry vision Does have fatigue worse with activity, better with rest - Acetylcholine receptor, binding  5. Cramp of both lower extremities - CPK, Magnesium, Iron  6. Unspecified hypothyroidism - TSH  7. Unspecified vitamin D deficiency - Vit D  25 hydroxy (rtn osteoporosis monitoring)  8. Acute cystitis without hematuria - Urinalysis, Routine w reflex microscopic - Microalbumin / creatinine urine ratio  OVER 40 minutes of exam, counseling, chart review, referral performed Continue diet and meds as discussed. Further disposition pending results of labs.  HPI 48 y.o. female  presents for 3 month follow up with hypertension, hyperlipidemia, prediabetes and vitamin D. Her blood pressure has been controlled at home, today it is elevated bc she has not filled her BP med, today their BP is BP: 140/82 mmHg She does not workout since her knee surgery. She denies chest pain, shortness of breath, dizziness.  She is on cholesterol medication and denies myalgias. Her cholesterol is at goal. The cholesterol last visit was:   Lab Results  Component Value Date   CHOL 169 02/06/2013   HDL 43 02/06/2013   LDLCALC 95 02/06/2013   TRIG 153* 02/06/2013   CHOLHDL 3.9 02/06/2013   Last A1C in the office was:  Lab Results  Component Value Date   HGBA1C 5.6 02/06/2013   Patient is on Vitamin D supplement.   Lab Results  Component Value Date   VD25OH 68 02/06/2013     She states that she feels so tired she does not want to repeat herself, decreased concentration, does  not want to turn to look the other way, worsening for 4 days She has had leg cramps x 6 weeks, headache for 1 week, she has had some blurry vision.  She has had two menstrual periods, and had knee surgery on the 8th.  She is still on her CPAP, and she feels much better in the morning, she will take naps with the CPAP and feels better after that, she states that she can fall asleep anywhere, for example of her fatigue her son had an end of the year party starting at 50, she had to leave at 5 to take a nap. Fatigue gets worse with the day.   She has been off her lexapro for 1 month.   Current Medications:  Current Outpatient Prescriptions on File Prior to Visit  Medication Sig Dispense Refill  . ALPRAZolam (XANAX) 0.5 MG tablet Take 1 tablet (0.5 mg total) by mouth 2 (two) times daily as needed.  60 tablet  0  . CRESTOR 10 MG tablet TAKE ONE TABLET BY MOUTH DAILY  30 tablet  0  . hydrochlorothiazide (HYDRODIURIL) 25 MG tablet TAKE 1 TABLET BY MOUTH DAILY  90 tablet  0  . IRON PO Take by mouth daily.      Marland Kitchen MAGNESIUM PO Take by mouth.        . OnabotulinumtoxinA (BOTOX IM) Inject into the muscle. 15-20 every 5 months      . phentermine (ADIPEX-P) 37.5 MG tablet Take 1 tablet (37.5 mg total) by mouth daily  before breakfast.  30 tablet  0  . promethazine (PHENERGAN) 25 MG tablet Take 1 tablet (25 mg total) by mouth every 6 (six) hours as needed for nausea or vomiting.  60 tablet  0  . valACYclovir (VALTREX) 500 MG tablet TAKE 1 TABLET BY MOUTH DAILY  30 tablet  0  . Vitamin D, Ergocalciferol, (DRISDOL) 50000 UNITS CAPS capsule TAKE ONE CAPSULE BY MOUTH THREE TIMES A WEEK  12 capsule  0   No current facility-administered medications on file prior to visit.   Medical History:  Past Medical History  Diagnosis Date  . ASCUS (atypical squamous cells of undetermined significance) on Pap smear     neg HR HPV 09/2008, ascus 06/2009,10/2009  . HSV-2 infection   . Hypercholesteremia   . Hypertension   .  OSA on CPAP   . Anemia   . Allergy   . Prediabetes    Allergies:  Allergies  Allergen Reactions  . Doxycycline Hives and Shortness Of Breath  . Adhesive [Tape]   . Amoxicillin-Pot Clavulanate Nausea Only    diarrhea     Review of Systems: See HPI  Family history- Review and unchanged Social history- Review and unchanged Physical Exam: BP 140/82  Pulse 80  Temp(Src) 97.9 F (36.6 C)  Resp 16  Ht 5\' 5"  (1.651 m)  Wt 265 lb (120.203 kg)  BMI 44.10 kg/m2  LMP 08/11/2013 Wt Readings from Last 3 Encounters:  08/23/13 265 lb (120.203 kg)  07/14/13 236 lb (107.049 kg)  05/04/13 262 lb (118.842 kg)   General Appearance: Well nourished, in no apparent distress. Eyes: PERRLA, EOMs, conjunctiva no swelling or erythema, horizonal nystagmus Sinuses: No Frontal/maxillary tenderness ENT/Mouth: Ext aud canals clear, TMs without erythema, bulging. No erythema, swelling, or exudate on post pharynx.  Tonsils not swollen or erythematous. Hearing normal.  Neck: Supple, thyroid normal. + TMJ tenderness Respiratory: Respiratory effort normal, BS equal bilaterally without rales, rhonchi, wheezing or stridor.  Cardio: RRR with no MRGs. Brisk peripheral pulses without edema.  Abdomen: Soft, + BS.  Non tender, no guarding, rebound, hernias, masses. Lymphatics: Non tender without lymphadenopathy.  Musculoskeletal: Full ROM, 5/5 strength, normal gait.  Skin: Warm, dry without rashes, lesions, ecchymosis.  Neuro: Cranial nerves intact. Normal muscle tone, no cerebellar symptoms. Sensation intact.  Psych: Awake and oriented X 3, normal affect, Insight and Judgment appropriate.    Vicie Mutters 3:05 PM

## 2013-08-23 NOTE — Patient Instructions (Signed)
Get back on lexapro and blood pressure medication  Fatigue Fatigue is a feeling of tiredness, lack of energy, lack of motivation, or feeling tired all the time. Having enough rest, good nutrition, and reducing stress will normally reduce fatigue. Consult your caregiver if it persists. The nature of your fatigue will help your caregiver to find out its cause. The treatment is based on the cause.  CAUSES  There are many causes for fatigue. Most of the time, fatigue can be traced to one or more of your habits or routines. Most causes fit into one or more of three general areas. They are: Lifestyle problems  Sleep disturbances.  Overwork.  Physical exertion.  Unhealthy habits.  Poor eating habits or eating disorders.  Alcohol and/or drug use .  Lack of proper nutrition (malnutrition). Psychological problems  Stress and/or anxiety problems.  Depression.  Grief.  Boredom. Medical Problems or Conditions  Anemia.  Pregnancy.  Thyroid gland problems.  Recovery from major surgery.  Continuous pain.  Emphysema or asthma that is not well controlled  Allergic conditions.  Diabetes.  Infections (such as mononucleosis).  Obesity.  Sleep disorders, such as sleep apnea.  Heart failure or other heart-related problems.  Cancer.  Kidney disease.  Liver disease.  Effects of certain medicines such as antihistamines, cough and cold remedies, prescription pain medicines, heart and blood pressure medicines, drugs used for treatment of cancer, and some antidepressants. SYMPTOMS  The symptoms of fatigue include:   Lack of energy.  Lack of drive (motivation).  Drowsiness.  Feeling of indifference to the surroundings. DIAGNOSIS  The details of how you feel help guide your caregiver in finding out what is causing the fatigue. You will be asked about your present and past health condition. It is important to review all medicines that you take, including prescription and  non-prescription items. A thorough exam will be done. You will be questioned about your feelings, habits, and normal lifestyle. Your caregiver may suggest blood tests, urine tests, or other tests to look for common medical causes of fatigue.  TREATMENT  Fatigue is treated by correcting the underlying cause. For example, if you have continuous pain or depression, treating these causes will improve how you feel. Similarly, adjusting the dose of certain medicines will help in reducing fatigue.  HOME CARE INSTRUCTIONS   Try to get the required amount of good sleep every night.  Eat a healthy and nutritious diet, and drink enough water throughout the day.  Practice ways of relaxing (including yoga or meditation).  Exercise regularly.  Make plans to change situations that cause stress. Act on those plans so that stresses decrease over time. Keep your work and personal routine reasonable.  Avoid street drugs and minimize use of alcohol.  Start taking a daily multivitamin after consulting your caregiver. SEEK MEDICAL CARE IF:   You have persistent tiredness, which cannot be accounted for.  You have fever.  You have unintentional weight loss.  You have headaches.  You have disturbed sleep throughout the night.  You are feeling sad.  You have constipation.  You have dry skin.  You have gained weight.  You are taking any new or different medicines that you suspect are causing fatigue.  You are unable to sleep at night.  You develop any unusual swelling of your legs or other parts of your body. SEEK IMMEDIATE MEDICAL CARE IF:   You are feeling confused.  Your vision is blurred.  You feel faint or pass out.  You develop  severe headache.  You develop severe abdominal, pelvic, or back pain.  You develop chest pain, shortness of breath, or an irregular or fast heartbeat.  You are unable to pass a normal amount of urine.  You develop abnormal bleeding such as bleeding from  the rectum or you vomit blood.  You have thoughts about harming yourself or committing suicide.  You are worried that you might harm someone else. MAKE SURE YOU:   Understand these instructions.  Will watch your condition.  Will get help right away if you are not doing well or get worse. Document Released: 12/07/2006 Document Revised: 05/04/2011 Document Reviewed: 12/07/2006 [facility] Patient Information 2015 [place], [place]. This information is not intended to replace advice given to you by your health care provider. Make sure you discuss any questions you have with your health care provider.

## 2013-08-24 LAB — FERRITIN: Ferritin: 36 ng/mL (ref 10–291)

## 2013-08-24 LAB — BASIC METABOLIC PANEL WITH GFR
BUN: 15 mg/dL (ref 6–23)
CO2: 26 mEq/L (ref 19–32)
Calcium: 9 mg/dL (ref 8.4–10.5)
Chloride: 105 mEq/L (ref 96–112)
Creat: 0.96 mg/dL (ref 0.50–1.10)
GFR, Est African American: 81 mL/min
GFR, Est Non African American: 71 mL/min
Glucose, Bld: 99 mg/dL (ref 70–99)
Potassium: 4.3 mEq/L (ref 3.5–5.3)
Sodium: 140 mEq/L (ref 135–145)

## 2013-08-24 LAB — URINALYSIS, ROUTINE W REFLEX MICROSCOPIC
Bilirubin Urine: NEGATIVE
Glucose, UA: NEGATIVE mg/dL
Hgb urine dipstick: NEGATIVE
Ketones, ur: NEGATIVE mg/dL
Leukocytes, UA: NEGATIVE
Nitrite: NEGATIVE
Protein, ur: NEGATIVE mg/dL
Specific Gravity, Urine: 1.027 (ref 1.005–1.030)
Urobilinogen, UA: 0.2 mg/dL (ref 0.0–1.0)
pH: 5 (ref 5.0–8.0)

## 2013-08-24 LAB — MICROALBUMIN / CREATININE URINE RATIO
Creatinine, Urine: 201.8 mg/dL
Microalb Creat Ratio: 3.2 mg/g (ref 0.0–30.0)
Microalb, Ur: 0.65 mg/dL (ref 0.00–1.89)

## 2013-08-24 LAB — IRON AND TIBC
%SAT: 15 % — ABNORMAL LOW (ref 20–55)
Iron: 61 ug/dL (ref 42–145)
TIBC: 414 ug/dL (ref 250–470)
UIBC: 353 ug/dL (ref 125–400)

## 2013-08-24 LAB — HEPATIC FUNCTION PANEL
ALT: 15 U/L (ref 0–35)
AST: 13 U/L (ref 0–37)
Albumin: 4.2 g/dL (ref 3.5–5.2)
Alkaline Phosphatase: 76 U/L (ref 39–117)
Bilirubin, Direct: 0.1 mg/dL (ref 0.0–0.3)
Indirect Bilirubin: 0.3 mg/dL (ref 0.2–1.2)
Total Bilirubin: 0.4 mg/dL (ref 0.2–1.2)
Total Protein: 6.8 g/dL (ref 6.0–8.3)

## 2013-08-24 LAB — LIPID PANEL
Cholesterol: 194 mg/dL (ref 0–200)
HDL: 53 mg/dL (ref >39)
LDL Cholesterol: 98 mg/dL (ref 0–99)
Total CHOL/HDL Ratio: 3.7 Ratio
Triglycerides: 217 mg/dL — ABNORMAL HIGH (ref <150)
VLDL: 43 mg/dL — ABNORMAL HIGH (ref 0–40)

## 2013-08-24 LAB — ANA: Anti Nuclear Antibody(ANA): NEGATIVE

## 2013-08-24 LAB — VITAMIN B12: Vitamin B-12: 537 pg/mL (ref 211–911)

## 2013-08-24 LAB — CK: Total CK: 48 U/L (ref 7–177)

## 2013-08-24 LAB — SEDIMENTATION RATE: Sed Rate: 7 mm/hr (ref 0–22)

## 2013-08-24 LAB — MAGNESIUM: Magnesium: 1.9 mg/dL (ref 1.5–2.5)

## 2013-08-24 LAB — FOLATE RBC: RBC Folate: 472 ng/mL (ref >280)

## 2013-08-24 LAB — TSH: TSH: 1.782 u[IU]/mL (ref 0.350–4.500)

## 2013-08-24 LAB — VITAMIN D 25 HYDROXY (VIT D DEFICIENCY, FRACTURES): Vit D, 25-Hydroxy: 76 ng/mL (ref 30–89)

## 2013-08-28 ENCOUNTER — Telehealth: Payer: Self-pay

## 2013-08-28 ENCOUNTER — Encounter: Payer: Self-pay | Admitting: Physician Assistant

## 2013-08-28 NOTE — Telephone Encounter (Signed)
Patients results released to mychart, hemoccult cards mailed today per Vicie Mutters, PA

## 2013-08-29 LAB — ACETYLCHOLINE RECEPTOR, BINDING: A CHR BINDING ABS: <0.3 nmol/L (ref <=0.30)

## 2013-09-18 ENCOUNTER — Other Ambulatory Visit: Payer: Self-pay | Admitting: Physician Assistant

## 2013-09-18 ENCOUNTER — Other Ambulatory Visit: Payer: Self-pay | Admitting: Gynecology

## 2013-09-19 ENCOUNTER — Other Ambulatory Visit: Payer: Self-pay | Admitting: Emergency Medicine

## 2013-09-20 ENCOUNTER — Telehealth: Payer: Self-pay | Admitting: *Deleted

## 2013-09-20 MED ORDER — VALACYCLOVIR HCL 500 MG PO TABS: ORAL_TABLET | ORAL | Status: DC

## 2013-09-20 NOTE — Telephone Encounter (Signed)
Pt has annual scheduled 11/10/13 requesting refill on Valtrex 500 mg tablet. rx will be sent to pharmacy.

## 2013-10-06 ENCOUNTER — Other Ambulatory Visit (INDEPENDENT_AMBULATORY_CARE_PROVIDER_SITE_OTHER): Payer: BC Managed Care – PPO

## 2013-10-06 ENCOUNTER — Encounter: Payer: Self-pay | Admitting: Family Medicine

## 2013-10-06 ENCOUNTER — Ambulatory Visit (INDEPENDENT_AMBULATORY_CARE_PROVIDER_SITE_OTHER): Payer: BC Managed Care – PPO | Admitting: Family Medicine

## 2013-10-06 VITALS — BP 122/80 | HR 64 | Ht 65.0 in | Wt 263.0 lb

## 2013-10-06 DIAGNOSIS — M25569 Pain in unspecified knee: Secondary | ICD-10-CM

## 2013-10-06 DIAGNOSIS — M25561 Pain in right knee: Secondary | ICD-10-CM

## 2013-10-06 DIAGNOSIS — M1710 Unilateral primary osteoarthritis, unspecified knee: Secondary | ICD-10-CM

## 2013-10-06 DIAGNOSIS — M1711 Unilateral primary osteoarthritis, right knee: Secondary | ICD-10-CM

## 2013-10-06 NOTE — Patient Instructions (Signed)
We will try the injection today Conitnue the compression Exercises 3 times a week.  Ice 20 minutes 2 times daily. Usually after activity and before bed. Come back in 3 weeks and we could start synvisc if needed.

## 2013-10-06 NOTE — Assessment & Plan Note (Signed)
Patient does have moderate osteophytic changes of the knee. We discussed icing: Other different treatment options including physical therapy. Patient elected to have a corticosteroid injection if there is some post surgical inflammation still present. Patient did have near complete resolution of pain immediately after the injection. Patient given different exercises the ankle be helpful as well as an icing protocol. Patient will followup in 3 weeks. If she continues to have pain she would have failed all conservative therapy as well as surgical intervention for the meniscus and could be a candidate for this to supplementation. We discussed about this as well and we will discuss further at followup.  Spent greater than 25 minutes with patient face-to-face and had greater than 50% of counseling including as described above in assessment and plan.

## 2013-10-06 NOTE — Progress Notes (Signed)
[name] [address] [place], [place] [address] Phone: [phone] Subjective:     CC: Knee pain follow up  [id number] Wendy Levine is a 48 y.o. female coming in for follow up of right knee pain. Patient was seen previously and did have a posterior meniscal tear. Patient actually did have surgical intervention 7 weeks ago. Patient was doing better but now after having a vacation is having a sharp pain with certain movements. Patient feels like it is more in the posterior aspect of the knee. Feels somewhat different than what it was previously. Patient did see the surgeon who does stated that she should continue with the exercises. States that it is not stopping her from any activities but she was hoping to be pain free at this time. Still significantly better than what it was prior to surgery. Patient is wearing compression which is helpful. Not taking any medications.    Past medical history, social, surgical and family history all reviewed in electronic medical record.   Review of Systems: No headache, visual changes, nausea, vomiting, diarrhea, constipation, dizziness, abdominal pain, skin rash, fevers, chills, night sweats, weight loss, swollen lymph nodes, body aches, joint swelling, muscle aches, chest pain, shortness of breath, mood changes.   Objective Blood pressure 122/80, pulse 64, height 5\' 5"  (1.651 m), weight 263 lb (119.296 kg), SpO2 97.00%.  General: No apparent distress alert and oriented x3 mood and affect normal, dressed appropriately.  HEENT: Pupils equal, extraocular movements intact  Respiratory: Patient's speak in full sentences and does not appear short of breath  Cardiovascular: No lower extremity edema, non tender, no erythema  Skin: Warm dry intact with no signs of infection or rash on extremities or on axial skeleton.  Abdomen: Soft nontender  Neuro: Cranial nerves II through XII are intact, neurovascularly intact in  all extremities with 2+ DTRs and 2+ pulses.  Lymph: No lymphadenopathy of posterior or anterior cervical chain or axillae bilaterally.  Gait normal with good balance and coordination.  MSK:  Non tender with full range of motion and good stability and symmetric strength and tone of shoulders, elbows, wrist, hip, and ankles bilaterally.  Knee: Right Normal to inspection with no erythema or effusion or obvious bony abnormalities. Patient's arthroscopic portals are well healed Palpation shows mild medial joint line and posterior medial joint line tenderness Continue tenderness over the medial joint line significantly. ROM full in flexion and extension and lower leg rotation. Ligaments with solid consistent endpoints including ACL, PCL, LCL, MCL.  positive Mcmurray's, Apley's, and Thessalonian tests. painful patellar compression. Patellar glide with minimal crepitus. Patellar and quadriceps tendons unremarkable. Hamstring and quadriceps strength is normal.  Contralateral knee unremarkable  MSK US performed of: Right knee This study was ordered, performed, and interpreted by Charlann Boxer D.O.  Knee: All structures visualized. Anteromedial, anterolateral,  and posterolateral menisci unremarkable without tearing, fraying, effusion, or displacement. Posterior medial meniscus has been removed continued moderate to severe osteophytic changes of the medial joint line Patellar Tendon unremarkable on long and transverse views without effusion. No abnormality of prepatellar bursa. LCL and MCL unremarkable on long and transverse views. No abnormality of origin of medial or lateral head of the gastrocnemius.  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 Right knee was prepped with alcohol swab and utilizing anterolateral approach, patient's right knee space was injected with 4:1  marcaine  0.5%: Kenalog 40mg /dL. Patient tolerated the procedure well without immediate complications.    Impression and Recommendations:     This case required medical decision making of moderate complexity.

## 2013-10-26 ENCOUNTER — Ambulatory Visit: Payer: BC Managed Care – PPO | Admitting: Family Medicine

## 2013-10-31 ENCOUNTER — Other Ambulatory Visit: Payer: Self-pay | Admitting: Physician Assistant

## 2013-11-10 ENCOUNTER — Other Ambulatory Visit (HOSPITAL_COMMUNITY)
Admission: RE | Admit: 2013-11-10 | Discharge: 2013-11-10 | Disposition: A | Discharge status: Home / Self Care | Payer: BC Managed Care – PPO | Source: Ambulatory Visit | Attending: Gynecology | Admitting: Gynecology

## 2013-11-10 ENCOUNTER — Ambulatory Visit (INDEPENDENT_AMBULATORY_CARE_PROVIDER_SITE_OTHER): Payer: BC Managed Care – PPO | Admitting: Gynecology

## 2013-11-10 ENCOUNTER — Ambulatory Visit: Payer: BC Managed Care – PPO | Admitting: Family Medicine

## 2013-11-10 ENCOUNTER — Encounter: Payer: Self-pay | Admitting: Gynecology

## 2013-11-10 VITALS — BP 120/80 | Ht 65.0 in | Wt 265.0 lb

## 2013-11-10 DIAGNOSIS — Z1151 Encounter for screening for human papillomavirus (HPV): Secondary | ICD-10-CM | POA: Diagnosis present

## 2013-11-10 DIAGNOSIS — Z01419 Encounter for gynecological examination (general) (routine) without abnormal findings: Secondary | ICD-10-CM

## 2013-11-10 DIAGNOSIS — A609 Anogenital herpesviral infection, unspecified: Secondary | ICD-10-CM

## 2013-11-10 DIAGNOSIS — B009 Herpesviral infection, unspecified: Secondary | ICD-10-CM

## 2013-11-10 DIAGNOSIS — L293 Anogenital pruritus, unspecified: Secondary | ICD-10-CM

## 2013-11-10 DIAGNOSIS — N898 Other specified noninflammatory disorders of vagina: Secondary | ICD-10-CM

## 2013-11-10 DIAGNOSIS — Z23 Encounter for immunization: Secondary | ICD-10-CM

## 2013-11-10 DIAGNOSIS — L292 Pruritus vulvae: Secondary | ICD-10-CM

## 2013-11-10 LAB — WET PREP FOR TRICH, YEAST, CLUE
Clue Cells Wet Prep HPF POC: NONE SEEN
Trich, Wet Prep: NONE SEEN

## 2013-11-10 MED ORDER — CLOBETASOL PROPIONATE 0.05 % EX CREA: TOPICAL_CREAM | CUTANEOUS | Status: DC

## 2013-11-10 MED ORDER — FLUCONAZOLE 200 MG PO TABS: 200.0000 mg | ORAL_TABLET | Freq: Every day | ORAL | Status: DC

## 2013-11-10 MED ORDER — VALACYCLOVIR HCL 500 MG PO TABS: ORAL_TABLET | ORAL | Status: DC

## 2013-11-10 NOTE — Patient Instructions (Signed)
Take Diflucan pill daily for 5 days. Hold your Crestor during this time period Applied the clobetasol cream externally at bedtime as needed for itch.  You may obtain a copy of any labs that were done today by logging onto MyChart as outlined in the instructions provided with your AVS (after visit summary). The office will not call with normal lab results but certainly if there are any significant abnormalities then we will contact you.   Health Maintenance, Female A healthy lifestyle and preventative care can promote health and wellness.  Maintain regular health, dental, and eye exams.  Eat a healthy diet. Foods like vegetables, fruits, whole grains, low-fat dairy products, and lean protein foods contain the nutrients you need without too many calories. Decrease your intake of foods high in solid fats, added sugars, and salt. Get information about a proper diet from your caregiver, if necessary.  Regular physical exercise is one of the most important things you can do for your health. Most adults should get at least 150 minutes of moderate-intensity exercise (any activity that increases your heart rate and causes you to sweat) each week. In addition, most adults need muscle-strengthening exercises on 2 or more days a week.   Maintain a healthy weight. The body mass index (BMI) is a screening tool to identify possible weight problems. It provides an estimate of body fat based on height and weight. Your caregiver can help determine your BMI, and can help you achieve or maintain a healthy weight. For adults 20 years and older:  A BMI below 18.5 is considered underweight.  A BMI of 18.5 to 24.9 is normal.  A BMI of 25 to 29.9 is considered overweight.  A BMI of 30 and above is considered obese.  Maintain normal blood lipids and cholesterol by exercising and minimizing your intake of saturated fat. Eat a balanced diet with plenty of fruits and vegetables. Blood tests for lipids and cholesterol  should begin at age 73 and be repeated every 5 years. If your lipid or cholesterol levels are high, you are over 50, or you are a high risk for heart disease, you may need your cholesterol levels checked more frequently.Ongoing high lipid and cholesterol levels should be treated with medicines if diet and exercise are not effective.  If you smoke, find out from your caregiver how to quit. If you do not use tobacco, do not start.  Lung cancer screening is recommended for adults aged 26 80 years who are at high risk for developing lung cancer because of a history of smoking. Yearly low-dose computed tomography (CT) is recommended for people who have at least a 30-pack-year history of smoking and are a current smoker or have quit within the past 15 years. A pack year of smoking is smoking an average of 1 pack of cigarettes a day for 1 year (for example: 1 pack a day for 30 years or 2 packs a day for 15 years). Yearly screening should continue until the smoker has stopped smoking for at least 15 years. Yearly screening should also be stopped for people who develop a health problem that would prevent them from having lung cancer treatment.  If you are pregnant, do not drink alcohol. If you are breastfeeding, be very cautious about drinking alcohol. If you are not pregnant and choose to drink alcohol, do not exceed 1 drink per day. One drink is considered to be 12 ounces (355 mL) of beer, 5 ounces (148 mL) of wine, or 1.5 ounces (44  mL) of liquor.  Avoid use of street drugs. Do not share needles with anyone. Ask for help if you need support or instructions about stopping the use of drugs.  High blood pressure causes heart disease and increases the risk of stroke. Blood pressure should be checked at least every 1 to 2 years. Ongoing high blood pressure should be treated with medicines, if weight loss and exercise are not effective.  If you are 80 to 47 years old, ask your caregiver if you should take aspirin  to prevent strokes.  Diabetes screening involves taking a blood sample to check your fasting blood sugar level. This should be done once every 3 years, after age 42, if you are within normal weight and without risk factors for diabetes. Testing should be considered at a younger age or be carried out more frequently if you are overweight and have at least 1 risk factor for diabetes.  Breast cancer screening is essential preventative care for women. You should practice "breast self-awareness." This means understanding the normal appearance and feel of your breasts and may include breast self-examination. Any changes detected, no matter how small, should be reported to a caregiver. Women in their 82s and 30s should have a clinical breast exam (CBE) by a caregiver as part of a regular health exam every 1 to 3 years. After age 70, women should have a CBE every year. Starting at age 41, women should consider having a mammogram (breast X-ray) every year. Women who have a family history of breast cancer should talk to their caregiver about genetic screening. Women at a high risk of breast cancer should talk to their caregiver about having an MRI and a mammogram every year.  Breast cancer gene (BRCA)-related cancer risk assessment is recommended for women who have family members with BRCA-related cancers. BRCA-related cancers include breast, ovarian, tubal, and peritoneal cancers. Having family members with these cancers may be associated with an increased risk for harmful changes (mutations) in the breast cancer genes BRCA1 and BRCA2. Results of the assessment will determine the need for genetic counseling and BRCA1 and BRCA2 testing.  The Pap test is a screening test for cervical cancer. Women should have a Pap test starting at age 64. Between ages 61 and 44, Pap tests should be repeated every 2 years. Beginning at age 29, you should have a Pap test every 3 years as long as the past 3 Pap tests have been normal. If  you had a hysterectomy for a problem that was not cancer or a condition that could lead to cancer, then you no longer need Pap tests. If you are between ages 84 and 40, and you have had normal Pap tests going back 10 years, you no longer need Pap tests. If you have had past treatment for cervical cancer or a condition that could lead to cancer, you need Pap tests and screening for cancer for at least 20 years after your treatment. If Pap tests have been discontinued, risk factors (such as a new sexual partner) need to be reassessed to determine if screening should be resumed. Some women have medical problems that increase the chance of getting cervical cancer. In these cases, your caregiver may recommend more frequent screening and Pap tests.  The human papillomavirus (HPV) test is an additional test that may be used for cervical cancer screening. The HPV test looks for the virus that can cause the cell changes on the cervix. The cells collected during the Pap test can be  tested for HPV. The HPV test could be used to screen women aged 30 years and older, and should be used in women of any age who have unclear Pap test results. After the age of 30, women should have HPV testing at the same frequency as a Pap test.  Colorectal cancer can be detected and often prevented. Most routine colorectal cancer screening begins at the age of 50 and continues through age 75. However, your caregiver may recommend screening at an earlier age if you have risk factors for colon cancer. On a yearly basis, your caregiver may provide home test kits to check for hidden blood in the stool. Use of a small camera at the end of a tube, to directly examine the colon (sigmoidoscopy or colonoscopy), can detect the earliest forms of colorectal cancer. Talk to your caregiver about this at age 50, when routine screening begins. Direct examination of the colon should be repeated every 5 to 10 years through age 75, unless early forms of  pre-cancerous polyps or small growths are found.  Hepatitis C blood testing is recommended for all people born from 1945 through 1965 and any individual with known risks for hepatitis C.  Practice safe sex. Use condoms and avoid high-risk sexual practices to reduce the spread of sexually transmitted infections (STIs). Sexually active women aged 25 and younger should be checked for Chlamydia, which is a common sexually transmitted infection. Older women with new or multiple partners should also be tested for Chlamydia. Testing for other STIs is recommended if you are sexually active and at increased risk.  Osteoporosis is a disease in which the bones lose minerals and strength with aging. This can result in serious bone fractures. The risk of osteoporosis can be identified using a bone density scan. Women ages 65 and over and women at risk for fractures or osteoporosis should discuss screening with their caregivers. Ask your caregiver whether you should be taking a calcium supplement or vitamin D to reduce the rate of osteoporosis.  Menopause can be associated with physical symptoms and risks. Hormone replacement therapy is available to decrease symptoms and risks. You should talk to your caregiver about whether hormone replacement therapy is right for you.  Use sunscreen. Apply sunscreen liberally and repeatedly throughout the day. You should seek shade when your shadow is shorter than you. Protect yourself by wearing long sleeves, pants, a wide-brimmed hat, and sunglasses year round, whenever you are outdoors.  Notify your caregiver of new moles or changes in moles, especially if there is a change in shape or color. Also notify your caregiver if a mole is larger than the size of a pencil eraser.  Stay current with your immunizations. Document Released: 08/25/2010 Document Revised: 06/06/2012 Document Reviewed: 08/25/2010 [employer] Patient Information 2014 [employer].   

## 2013-11-10 NOTE — Progress Notes (Signed)
Wendy Levine 05-Oct-1965 [id number]        48 y.o.  G1P1001 for annual exam.  Several issues noted below.  Past medical history,surgical history, problem list, medications, allergies, family history and social history were all reviewed and documented as reviewed in the EPIC chart.  ROS:  12 system ROS performed with pertinent positives and negatives included in the history, assessment and plan.   Additional significant findings :  none   Exam: Kim Counsellor Vitals:   11/10/13 1057  BP: 120/80  Height: 5\' 5"  (1.651 m)  Weight: 265 lb (120.203 kg)   General appearance:  Normal affect, orientation and appearance. Skin: Grossly normal HEENT: Without gross lesions.  No cervical or supraclavicular adenopathy. Thyroid normal.  Lungs:  Clear without wheezing, rales or rhonchi Cardiac: RR, without RMG Abdominal:  Soft, nontender, without masses, guarding, rebound, organomegaly or hernia Breasts:  Examined lying and sitting without masses, retractions, discharge or axillary adenopathy. Pelvic:  Ext/BUS/vagina with dermatitis above the clitoral hood with scaly erythematous skin no specific lesions. No changes to suggest lichen sclerosus. Thick white discharge  Cervix normal. Pap done  Uterus grossly normal midline mobile nontender, difficult to palpate due to abdominal girth  Adnexa  Without gross  masses or tenderness    Anus and perineum  Normal   Rectovaginal  Normal sphincter tone without palpated masses or tenderness.    Assessment/Plan:  48 y.o. G58P1001 female for annual exam with regular menses, rhythm/condom contraception.   1. Chronic dermatitis above clitoral hood. Patient has tried antifungal medications in the does not seem to help with steroid creams do. Her exam is consistent with a chronic dermatitis picture. Does not appear to be lichen sclerosus. No specific lesions. We'll treat with Temovate 0.05% cream nightly x2 weeks and then when necessary. Avoidance of  prolonged use discussed. Assuming it totally resolves and will follow. If persists or worsens then represent for further evaluation possible biopsy. 2. Vaginal discharge. Wet prep is positive for yeast. Will treat with Diflucan 200 mg daily for 5 days to eradicate any skin involvement. Stopping cholesterol medicine during this time discussed. 3. Birth control. Patient continues with rhythm/condoms. I again discussed the failure risk with this and she clearly understands this and accepts the failure risk. Does not want alternative birth control. 4. Pap smear/HPV negative 2014.  Pap smear done today. She does have a history of ASCUS times several 2010/2011. 5. 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 Valtrex 500 mg #30 with one refill provided. 6. Mammography 07/2011. Continue with annual mammography. SBE monthly reviewed. 7. Health maintenance. Patient reports routine blood work done recently through her primary 13 office who follows her for her medical issues. Follow up one year, sooner as needed.     Anastasio Auerbach MD, 11:34 AM 11/10/2013

## 2013-11-10 NOTE — Addendum Note (Signed)
Addended by: Nelva Nay on: 11/10/2013 12:09 PM   Modules accepted: Orders

## 2013-11-11 LAB — URINALYSIS W MICROSCOPIC + REFLEX CULTURE
Bacteria, UA: NONE SEEN
Bilirubin Urine: NEGATIVE
Casts: NONE SEEN
Crystals: NONE SEEN
Glucose, UA: NEGATIVE mg/dL
Hgb urine dipstick: NEGATIVE
Ketones, ur: NEGATIVE mg/dL
Leukocytes, UA: NEGATIVE
Nitrite: NEGATIVE
Protein, ur: NEGATIVE mg/dL
Specific Gravity, Urine: 1.016 (ref 1.005–1.030)
Squamous Epithelial / HPF: NONE SEEN
Urobilinogen, UA: 0.2 mg/dL (ref 0.0–1.0)
pH: 5.5 (ref 5.0–8.0)

## 2013-11-14 LAB — CYTOLOGY - PAP

## 2013-12-03 ENCOUNTER — Ambulatory Visit (INDEPENDENT_AMBULATORY_CARE_PROVIDER_SITE_OTHER): Payer: BC Managed Care – PPO | Admitting: Family Medicine

## 2013-12-03 VITALS — BP 147/93 | HR 74 | Temp 99.1°F | Resp 20 | Ht 65.0 in | Wt 264.0 lb

## 2013-12-03 DIAGNOSIS — M79661 Pain in right lower leg: Secondary | ICD-10-CM

## 2013-12-03 NOTE — Progress Notes (Signed)
I was directly involved with the patient's care and agree with the physical, diagnosis and treatment plan.  

## 2013-12-03 NOTE — Progress Notes (Signed)
Subjective:    Patient ID: Wendy Levine, female    DOB: [birth year], 48 y.o.   MRN: [id number]  HPI  This is a 48 year old female with PMH HTN, HLD who is presenting with 3 days of right calf pain. Three days ago she woke with the pain and does not remember an injury. The pain is worse with initial movement and better after moving some. She has noticed some swelling in her right foot and states her right foot feels hot. She is worried about a blood clot because of recent knee surgeries - a scope in her right knee 3 months ago and a scope of her left knee 5 weeks ago. She had no complications from these procedures and was not bed-bound for either. She has been very active in the past week cleaning and cooking preparing for a party. This morning she felt a knot in her right calf which ultimately prompted her to come in. She has tried advil, tramadol and heat with no relief. Ice and compression helped some. She does not have a history of a blood clot. She has not traveled recently. She is not a smoker. She is not on OCPs. She denies SOB, chest pain, fever, chills, malaise.   Review of Systems  Constitutional: Negative.   HENT: Negative.   Respiratory: Negative.   Cardiovascular: Negative.   Musculoskeletal: Positive for arthralgias (bilateral knees) and myalgias (right calf). Negative for back pain.  Skin: Negative.   Hematological: Negative.       Objective:   Physical Exam  Constitutional: She is oriented to person, place, and time. She appears well-developed and well-nourished. No distress.  HENT:  Head: Normocephalic and atraumatic.  Right Ear: Hearing normal.  Left Ear: Hearing normal.  Nose: Nose normal.  Eyes: Conjunctivae and lids are normal. No scleral icterus.  Cardiovascular: Normal rate, regular rhythm and normal heart sounds.   Pulmonary/Chest: Effort normal and breath sounds normal. Not tachypneic. No respiratory distress.  Musculoskeletal: Normal range of motion.     Right lower leg: She exhibits tenderness (over mid/lateral calf. No cords or knots felt.).       Left lower leg: Normal.       Legs: FROM of bilateral knees. Strength 5/5. Dorsalis pedis pulses 2+ and symmetric. Skin not erythematous. Homans negative. Left calf measuring 44 cm, right calf measuring 42 cm  Neurological: She is alert and oriented to person, place, and time. She has normal strength. No sensory deficit.  Skin: Skin is warm, dry and intact. She is not diaphoretic. No erythema.  Some edema on dorsum of right foot, non-pitting.  Psychiatric: She has a normal mood and affect. Her speech is normal and behavior is normal. Thought content normal.       Assessment & Plan:  1. Calf pain, right  Patient is low-risk for a DVT. She was not bedridden after her knee surgeries. Her right knee surgery was 3 months ago. She has been very active recently. She is not on birth control, not a smoker, does not have a history of blood clots, and no recent travel. Discussed at length the options with the patient - d dimer vs. Stat U/S vs. Visiting her orthopedic doctor tomorrow who has a U/S machine in office. She opted to wait and go see her orthopedist tomorrow. In the meantime she will take aspirin 325 mg and rest, compress, elevate and ice. She was counseled on red flag signs and when to go to the ED.  Wendy Levine Wendy Levine, MHS [facility]  12/03/2013

## 2013-12-03 NOTE — Progress Notes (Signed)
Reviewed documentation and agree w/ assessment and plan. Eva Shaw, MD MPH 

## 2013-12-03 NOTE — Patient Instructions (Signed)
Take OTC aspirin. Rest, Ice, Compression elevation for your pain. Follow up with Dr. Tamala Julian tomorrow.  RICE: Routine Care for Injuries The routine care of many injuries includes Rest, Ice, Compression, and Elevation (RICE). HOME CARE INSTRUCTIONS  Rest is needed to allow your body to heal. Routine activities can usually be resumed when comfortable. Injured tendons and bones can take up to 6 weeks to heal. Tendons are the cord-like structures that attach muscle to bone.  Ice following an injury helps keep the swelling down and reduces pain.  Put ice in a plastic bag.  Place a towel between your skin and the bag.  Leave the ice on for 15-20 minutes, 3-4 times a day, or as directed by your health care provider. Do this while awake, for the first 24 to 48 hours. After that, continue as directed by your caregiver.  Compression helps keep swelling down. It also gives support and helps with discomfort. If an elastic bandage has been applied, it should be removed and reapplied every 3 to 4 hours. It should not be applied tightly, but firmly enough to keep swelling down. Watch fingers or toes for swelling, bluish discoloration, coldness, numbness, or excessive pain. If any of these problems occur, remove the bandage and reapply loosely. Contact your caregiver if these problems continue.  Elevation helps reduce swelling and decreases pain. With extremities, such as the arms, hands, legs, and feet, the injured area should be placed near or above the level of the heart, if possible. SEEK IMMEDIATE MEDICAL CARE IF:  You have persistent pain and swelling.  You develop redness, numbness, or unexpected weakness.  Your symptoms are getting worse rather than improving after several days. These symptoms may indicate that further evaluation or further X-rays are needed. Sometimes, X-rays may not show a small broken bone (fracture) until 1 week or 10 days later. Make a follow-up appointment with your caregiver.  Ask when your X-ray results will be ready. Make sure you get your X-ray results. Document Released: 05/24/2000 Document Revised: 02/14/2013 Document Reviewed: 07/11/2010 [facility] Patient Information 2015 [place], [place]. This information is not intended to replace advice given to you by your health care provider. Make sure you discuss any questions you have with your health care provider.

## 2013-12-04 ENCOUNTER — Ambulatory Visit (INDEPENDENT_AMBULATORY_CARE_PROVIDER_SITE_OTHER): Payer: BC Managed Care – PPO | Admitting: Physician Assistant

## 2013-12-04 ENCOUNTER — Other Ambulatory Visit: Payer: Self-pay | Admitting: Physician Assistant

## 2013-12-04 ENCOUNTER — Other Ambulatory Visit: Payer: Self-pay | Admitting: *Deleted

## 2013-12-04 ENCOUNTER — Other Ambulatory Visit: Payer: Self-pay | Admitting: Neurosurgery

## 2013-12-04 ENCOUNTER — Ambulatory Visit: Payer: Self-pay | Admitting: Physician Assistant

## 2013-12-04 ENCOUNTER — Ambulatory Visit
Admission: RE | Admit: 2013-12-04 | Discharge: 2013-12-04 | Disposition: A | Discharge status: Home / Self Care | Payer: BC Managed Care – PPO | Source: Ambulatory Visit | Attending: Physician Assistant | Admitting: Physician Assistant

## 2013-12-04 VITALS — BP 132/80 | HR 84 | Temp 98.1°F | Resp 16 | Ht 65.0 in | Wt 267.0 lb

## 2013-12-04 DIAGNOSIS — M7989 Other specified soft tissue disorders: Secondary | ICD-10-CM

## 2013-12-04 DIAGNOSIS — M79661 Pain in right lower leg: Secondary | ICD-10-CM

## 2013-12-04 DIAGNOSIS — R600 Localized edema: Secondary | ICD-10-CM

## 2013-12-04 DIAGNOSIS — R229 Localized swelling, mass and lump, unspecified: Secondary | ICD-10-CM

## 2013-12-04 DIAGNOSIS — IMO0002 Reserved for concepts with insufficient information to code with codable children: Secondary | ICD-10-CM

## 2013-12-04 LAB — COMPREHENSIVE METABOLIC PANEL
ALT: 17 U/L (ref 0–35)
AST: 17 U/L (ref 0–37)
Albumin: 4.3 g/dL (ref 3.5–5.2)
Alkaline Phosphatase: 79 U/L (ref 39–117)
BUN: 12 mg/dL (ref 6–23)
CO2: 26 mEq/L (ref 19–32)
Calcium: 9.1 mg/dL (ref 8.4–10.5)
Chloride: 100 mEq/L (ref 96–112)
Creat: 0.84 mg/dL (ref 0.50–1.10)
Glucose, Bld: 105 mg/dL — ABNORMAL HIGH (ref 70–99)
Potassium: 3.7 mEq/L (ref 3.5–5.3)
Sodium: 137 mEq/L (ref 135–145)
Total Bilirubin: 0.6 mg/dL (ref 0.2–1.2)
Total Protein: 7.1 g/dL (ref 6.0–8.3)

## 2013-12-04 LAB — CBC WITH DIFFERENTIAL/PLATELET
Basophils Absolute: 0.1 10*3/uL (ref 0.0–0.1)
Basophils Relative: 1 % (ref 0–1)
Eosinophils Absolute: 0.1 10*3/uL (ref 0.0–0.7)
Eosinophils Relative: 2 % (ref 0–5)
HCT: 39.9 % (ref 36.0–46.0)
Hemoglobin: 13.1 g/dL (ref 12.0–15.0)
Lymphocytes Relative: 20 % (ref 12–46)
Lymphs Abs: 1.3 10*3/uL (ref 0.7–4.0)
MCH: 27.9 pg (ref 26.0–34.0)
MCHC: 32.8 g/dL (ref 30.0–36.0)
MCV: 84.9 fL (ref 78.0–100.0)
Monocytes Absolute: 0.4 10*3/uL (ref 0.1–1.0)
Monocytes Relative: 6 % (ref 3–12)
Neutro Abs: 4.6 10*3/uL (ref 1.7–7.7)
Neutrophils Relative %: 71 % (ref 43–77)
Platelets: 255 10*3/uL (ref 150–400)
RBC: 4.7 MIL/uL (ref 3.87–5.11)
RDW: 13.9 % (ref 11.5–15.5)
WBC: 6.5 10*3/uL (ref 4.0–10.5)

## 2013-12-04 LAB — URIC ACID: Uric Acid, Serum: 5.4 mg/dL (ref 2.4–7.0)

## 2013-12-04 NOTE — Patient Instructions (Signed)

## 2013-12-04 NOTE — Progress Notes (Signed)
   Subjective:    Patient ID: Wendy Levine, female    DOB: [birth year], 48 y.o.   MRN: [id number]  HPI 48 y.o. female with right leg pain. Thursday morning woke up with cramp in right leg, tried to walk it out but woke up Friday continued with cramping, felt like rubber band around right thigh with pain down her leg. Then Sunday she was in the shower and felt a lump on her distal calf and she has had some swelling distal right leg. She states that last night and this AM she has had warmth on her right foot, the pain is like a pulling, worse with dorsiflexion and bearing weight. She took advil, tramadol, and ASA without help. She had right knee arthroscopy in June, and left knee in Sept, she is obese, denies smoking, BCP, family/personal history.    Review of Systems  Constitutional: Negative.   HENT: Negative.   Respiratory: Negative.   Cardiovascular: Positive for leg swelling. Negative for chest pain and palpitations.  Gastrointestinal: Negative.   Genitourinary: Negative.   Musculoskeletal: Positive for joint swelling and myalgias. Negative for arthralgias, back pain, gait problem, neck pain and neck stiffness.  Neurological: Negative.        Objective:   Physical Exam  Constitutional: She is oriented to person, place, and time. She appears well-developed and well-nourished.  Cardiovascular: Normal rate and regular rhythm.   Pulmonary/Chest: Effort normal and breath sounds normal.  Abdominal: Soft. Bowel sounds are normal.  obese  Musculoskeletal: Normal range of motion.  Right leg with mild swelling compared to left, nontender, neg homan's sign, mild warmth around right knee, tender posterior right knee with possible baker's cyst, without erythema, normal distal pulses, sensation, and good reflexes.   Neurological: She is alert and oriented to person, place, and time. She has normal reflexes. No cranial nerve deficit.  Skin: Skin is dry. No rash noted.      Assessment &  Plan:  Right leg pain/swelling- DVT unlikely ? Bursitis/ruptured bursa/gout- will check CBC CMET and gout, will get [place] to look for DVT since obese/recent surgeries and will also look at bursa at that time, follow up with Ortho.

## 2013-12-06 ENCOUNTER — Ambulatory Visit (INDEPENDENT_AMBULATORY_CARE_PROVIDER_SITE_OTHER): Payer: BC Managed Care – PPO | Admitting: Family Medicine

## 2013-12-06 ENCOUNTER — Encounter: Payer: Self-pay | Admitting: Family Medicine

## 2013-12-06 VITALS — BP 122/84 | HR 65 | Ht 65.0 in | Wt 266.0 lb

## 2013-12-06 DIAGNOSIS — R252 Cramp and spasm: Secondary | ICD-10-CM

## 2013-12-06 DIAGNOSIS — M7671 Peroneal tendinitis, right leg: Secondary | ICD-10-CM

## 2013-12-06 MED ORDER — GABAPENTIN 100 MG PO CAPS: 100.0000 mg | ORAL_CAPSULE | Freq: Every day | ORAL | Status: DC

## 2013-12-06 NOTE — Progress Notes (Signed)
  [name] [address] [place], [place] [address] Phone: [phone] Subjective:        CC: Right leg pain  [id number] Wendy Levine is a 48 y.o. female coming in with complaint of patient one week ago woke up having a cramp in her right leg. Patient states that she attempted to try to walk on it and unfortunately throughout the day as well as the next day she started having worsening worsening pain in the right thigh radiating down her leg. Patient urgent care and we did send her for a ultrasound to rule out a deep venous thrombosis. We did review this exam and it was negative. Patient states that the calf pain seems to be improving slowly. Patient states that it radiates to the anterior dorsal aspect of the ankle. Patient states that she is having a warmness on her foot at the end of a long day. Patient states that it makes it somewhat difficult to do different activities. Patient has been standing on her feet a significant amount more that could have contributed to it. Patient is also having cramping in the legs at night that is waking her up. Patient has had this problem previously but it seems to be significantly worse. Denies any weakness or continued numbness. Patient rates the severity of this problem is 5/10.     Past medical history, social, surgical and family history all reviewed in electronic medical record.   Review of Systems: No headache, visual changes, nausea, vomiting, diarrhea, constipation, dizziness, abdominal pain, skin rash, fevers, chills, night sweats, weight loss, swollen lymph nodes, body aches, joint swelling, muscle aches, chest pain, shortness of breath, mood changes.   Objective Blood pressure 122/84, pulse 65, height 5\' 5"  (1.651 m), weight 266 lb (120.657 kg), last menstrual period 11/19/2013, SpO2 97.00%.  General: No apparent distress alert and oriented x3 mood and affect normal, dressed appropriately.  HEENT: Pupils  equal, extraocular movements intact  Respiratory: Patient's speak in full sentences and does not appear short of breath  Cardiovascular: No lower extremity edema, non tender, no erythema  Skin: Warm dry intact with no signs of infection or rash on extremities or on axial skeleton.  Abdomen: Soft nontender  Neuro: Cranial nerves II through XII are intact, neurovascularly intact in all extremities with 2+ DTRs and 2+ pulses.  Lymph: No lymphadenopathy of posterior or anterior cervical chain or axillae bilaterally.  Gait normal with good balance and coordination.  MSK:  Non tender with full range of motion and good stability and symmetric strength and tone of shoulders, elbows, wrist, hip, knee and ankles bilaterally.   Calf exam shows no swelling compared to the contralateral side and is minimally tender over the distal third. Patient is minimally tender over the peroneal tendon running across the lateral aspect of the ankle and on the dorsum of the foot at the insertion over the cuboid bone. There is some mild swelling in this area. Foot exam shows no significant pes planus her overpronation. Patient is neurovascularly intact distally with full strength of the ankle bilaterally.    Impression and Recommendations:     This case required medical decision making of moderate complexity.

## 2013-12-06 NOTE — Assessment & Plan Note (Signed)
Patient is having some leg cramping at night that could be consistent with more of her iron deficiency she's had previously. Patient's lesser toes 36 I would like her to be closer to 40 or 50. We discussed increasing her medication to 2 times daily and warned of potential side effects such as constipation. Patient will take vitamin C to help with absorption. In addition and we discussed B12 supplementation Be helpful as well. We discussed proper shoe choices and making sure she stretches after activity. Discussed icing after activity but not before bed and indicates that this is exacerbating the situation. Patient will try these interventions and come back and see me again in 3-4 weeks to make sure she continues to improve.  Spent greater than 25 minutes with patient face-to-face and had greater than 50% of counseling including as described above in assessment and plan.

## 2013-12-06 NOTE — Assessment & Plan Note (Signed)
The patient is a peroneal tendinitis mostly. The discussed with patient's home exercises and was given a handout. Discussed an icing regimen. Discuss the possibility of bracing that could be beneficial. I do think that the insertion of the peroneal smear the Achilles tendon is likely what was the pain she had initially with the calf pain as well. Patient monitoring do a compression sleeve as well and we'll see patient back in 3-4 weeks and if she is making improvement. If not we may need to consider imaging as well as formal physical therapy.

## 2013-12-06 NOTE — Patient Instructions (Addendum)
So good to see you The knee looks good we can repeat injeciton in 1 month if needed.  Ice only 10 minutes after activity.  B12 1080mcg daily Increase iron to twice daily with 500mg  of vitamin C Gabapentin 100mg  at night.  New exercises for the ankle.  Compression sleeve while standing long time.  See me again in 3-4 weeks.

## 2013-12-21 ENCOUNTER — Other Ambulatory Visit: Payer: Self-pay | Admitting: Physician Assistant

## 2013-12-25 ENCOUNTER — Encounter: Payer: Self-pay | Admitting: Family Medicine

## 2014-01-11 ENCOUNTER — Other Ambulatory Visit (INDEPENDENT_AMBULATORY_CARE_PROVIDER_SITE_OTHER): Payer: BC Managed Care – PPO

## 2014-01-11 ENCOUNTER — Encounter: Payer: Self-pay | Admitting: Family Medicine

## 2014-01-11 ENCOUNTER — Ambulatory Visit (INDEPENDENT_AMBULATORY_CARE_PROVIDER_SITE_OTHER): Payer: BC Managed Care – PPO | Admitting: Family Medicine

## 2014-01-11 VITALS — BP 122/66 | HR 70 | Ht 66.0 in | Wt 271.0 lb

## 2014-01-11 DIAGNOSIS — M25561 Pain in right knee: Secondary | ICD-10-CM

## 2014-01-11 DIAGNOSIS — M25562 Pain in left knee: Secondary | ICD-10-CM

## 2014-01-11 DIAGNOSIS — M7051 Other bursitis of knee, right knee: Secondary | ICD-10-CM

## 2014-01-11 DIAGNOSIS — M7052 Other bursitis of knee, left knee: Secondary | ICD-10-CM

## 2014-01-11 NOTE — Patient Instructions (Signed)
Good to see you Pes anserine bursitis.  Ice 20 minutes 2 times daily. Usually after activity and before bed. Exercises 3 times a week.  Compression to hamstring will be helpful.  See me again in 3-4 weeks.

## 2014-01-11 NOTE — Assessment & Plan Note (Addendum)
Ultrasound guided injection in both knees 01/11/2014 Patient tolerated the procedure well. Patient given icing protocol as well as home exercise program. Reviewed the relevant anatomy with the patient about the knee and pes anserinus.  Given a handout from [facility] on Pes bursitis that outlines conservative treatment and basic stretching and strengthening.  Ice massage 3-4 times a day for a week NSAIDS routinely for 2-3 weeks, topical given.

## 2014-01-11 NOTE — Progress Notes (Signed)
[name] [address] [place], [place] [address] Phone: [phone] Subjective:        CC: Bilateral knee pain  [id number]:[id number] Hope Ulatowski is a 48 y.o. female coming in with bilateral knee pain. Patient has had an meniscal tears previously. Patient states that the medial aspect of both of her knees is starting to give her some difficulty. Patient was last injected greater than 3 months ago. States that this is somewhat of a different pain. Worse when she is laying down at night. Worse when she is going down hills. Denies any radiation to her toes but states that she can have cramping in her leg sometimes. Patient puts the severity of pain is 6 out of 10. Patient is here for further evaluation and possibly injections.     Past medical history, social, surgical and family history all reviewed in electronic medical record.   Review of Systems: No headache, visual changes, nausea, vomiting, diarrhea, constipation, dizziness, abdominal pain, skin rash, fevers, chills, night sweats, weight loss, swollen lymph nodes, body aches, joint swelling, muscle aches, chest pain, shortness of breath, mood changes.   Objective Blood pressure 122/66, pulse 70, height 5\' 6"  (1.676 m), weight 271 lb (122.925 kg), SpO2 97 %.  General: No apparent distress alert and oriented x3 mood and affect normal, dressed appropriately.  HEENT: Pupils equal, extraocular movements intact  Respiratory: Patient's speak in full sentences and does not appear short of breath  Cardiovascular: No lower extremity edema, non tender, no erythema  Skin: Warm dry intact with no signs of infection or rash on extremities or on axial skeleton.  Abdomen: Soft nontender  Neuro: Cranial nerves II through XII are intact, neurovascularly intact in all extremities with 2+ DTRs and 2+ pulses.  Lymph: No lymphadenopathy of posterior or anterior cervical chain or axillae bilaterally.  Gait normal with good  balance and coordination.  MSK:  Non tender with full range of motion and good stability and symmetric strength and tone of shoulders, elbows, wrist, hip, and ankles bilaterally.   Knee: Bilateral Normal to inspection with no erythema or effusion or obvious bony abnormalities. Mild medial joint line tenderness bilaterally but severe pes anserine bursa navicular tender ROM full in flexion and extension and lower leg rotation. Ligaments with solid consistent endpoints including ACL, PCL, LCL, MCL. Negative Mcmurray's, Apley's, and Thessalonian tests. Non painful patellar compression. Patellar glide with moderate crepitus. Patellar and quadriceps tendons unremarkable. Hamstring and quadriceps strength is normal.    Procedure: Real-time Ultrasound Guided Injection of bilateral pes anserine bursa Device: GE Logiq E  Ultrasound guided injection is preferred based studies that show increased duration, increased effect, greater accuracy, decreased procedural pain, increased response rate, and decreased cost with ultrasound guided versus blind injection.  Verbal informed consent obtained.  Time-out conducted.  Noted no overlying erythema, induration, or other signs of local infection.  Skin prepped in a sterile fashion.  Local anesthesia: Topical Ethyl chloride.  With sterile technique and under real time ultrasound guidance:  With a purulent gauge 2 inch needle patient was injected with 2 mL of 0.5% Marcaine and 1 mL of total of 40 mg/dL into the pes anserine area bilaterally. Picture saved. Completed without difficulty  Pain immediately resolved suggesting accurate placement of the medication.  Advised to call if fevers/chills, erythema, induration, drainage, or persistent bleeding.  Images permanently stored and available for review in the ultrasound unit.  Impression: Technically successful ultrasound guided injection.    Impression  and Recommendations:     This case required medical  decision making of moderate complexity.

## 2014-01-22 ENCOUNTER — Telehealth: Payer: Self-pay | Admitting: Family Medicine

## 2014-01-22 NOTE — Telephone Encounter (Signed)
Pt called in said that she just wanted Dr Tamala Julian to know that the band that he gave her was to big and she will need a smaller band.

## 2014-01-22 NOTE — Telephone Encounter (Signed)
lmovm for pt to return call.  

## 2014-01-30 ENCOUNTER — Ambulatory Visit (INDEPENDENT_AMBULATORY_CARE_PROVIDER_SITE_OTHER): Payer: BC Managed Care – PPO | Admitting: Gynecology

## 2014-01-30 ENCOUNTER — Encounter: Payer: Self-pay | Admitting: Gynecology

## 2014-01-30 DIAGNOSIS — N915 Oligomenorrhea, unspecified: Secondary | ICD-10-CM

## 2014-01-30 DIAGNOSIS — R140 Abdominal distension (gaseous): Secondary | ICD-10-CM

## 2014-01-30 LAB — TSH: TSH: 1.491 u[IU]/mL (ref 0.350–4.500)

## 2014-01-30 NOTE — Progress Notes (Signed)
Wendy Levine 1966/01/10 [id number]        48 y.o.  G1P1001 Presents with 3 months of light staining menses every 30 days whereas before she had heavy flow 2 days and then lighter flow for another 3 days. Feels bloated like. About the come but never starts well. No bleeding in between. No nausea vomiting constipation diarrhea. No urinary symptoms such as frequency dysuria urgency.  Past medical history,surgical history, problem list, medications, allergies, family history and social history were all reviewed and documented in the EPIC chart.  Directed ROS with pertinent positives and negatives documented in the history of present illness/assessment and plan.  Exam: [place] assistant General appearance:  Normal Abdomen soft nontender without masses guarding rebound Pelvic external BUS vagina normal. Cervix normal with light menses flow. Uterus grossly normal midline mobile nontender. Adnexa without masses or tenderness.  Assessment/Plan:  48 y.o. G1P1001 with 3 months of hypomenorrhea and bloating. Questionable anovulatory cycles. We'll start with baseline labs to include qualitative hCG FSH TSH and prolactin. Will follow up for ultrasound to rule out ovarian process and look at the endometrial cavity.     Anastasio Auerbach MD, 10:16 AM 01/30/2014

## 2014-01-30 NOTE — Patient Instructions (Signed)
Follow up for ultrasound as scheduled 

## 2014-01-31 LAB — HCG, SERUM, QUALITATIVE: Preg, Serum: NEGATIVE

## 2014-01-31 LAB — PROLACTIN: Prolactin: 5.9 ng/mL

## 2014-01-31 LAB — FOLLICLE STIMULATING HORMONE: FSH: 8.1 m[IU]/mL

## 2014-02-07 ENCOUNTER — Encounter: Payer: Self-pay | Admitting: Physician Assistant

## 2014-02-26 ENCOUNTER — Other Ambulatory Visit: Payer: Self-pay | Admitting: Gynecology

## 2014-02-26 DIAGNOSIS — N915 Oligomenorrhea, unspecified: Secondary | ICD-10-CM

## 2014-02-28 ENCOUNTER — Other Ambulatory Visit: Payer: Self-pay | Admitting: Physician Assistant

## 2014-02-28 ENCOUNTER — Other Ambulatory Visit: Payer: Self-pay

## 2014-02-28 MED ORDER — HYDROCHLOROTHIAZIDE 25 MG PO TABS: 25.0000 mg | ORAL_TABLET | Freq: Every day | ORAL | Status: DC

## 2014-03-01 ENCOUNTER — Encounter: Payer: Self-pay | Admitting: Gynecology

## 2014-03-01 ENCOUNTER — Other Ambulatory Visit: Payer: Self-pay | Admitting: Gynecology

## 2014-03-01 ENCOUNTER — Ambulatory Visit (INDEPENDENT_AMBULATORY_CARE_PROVIDER_SITE_OTHER): Payer: BLUE CROSS/BLUE SHIELD

## 2014-03-01 ENCOUNTER — Ambulatory Visit (INDEPENDENT_AMBULATORY_CARE_PROVIDER_SITE_OTHER): Payer: BLUE CROSS/BLUE SHIELD | Admitting: Gynecology

## 2014-03-01 DIAGNOSIS — N915 Oligomenorrhea, unspecified: Secondary | ICD-10-CM

## 2014-03-01 DIAGNOSIS — N83201 Unspecified ovarian cyst, right side: Secondary | ICD-10-CM

## 2014-03-01 DIAGNOSIS — R102 Pelvic and perineal pain: Secondary | ICD-10-CM

## 2014-03-01 DIAGNOSIS — N83291 Other ovarian cyst, right side: Secondary | ICD-10-CM

## 2014-03-01 DIAGNOSIS — N8329 Other ovarian cysts: Secondary | ICD-10-CM

## 2014-03-01 DIAGNOSIS — N8320 Unspecified ovarian cysts: Secondary | ICD-10-CM

## 2014-03-01 DIAGNOSIS — R1900 Intra-abdominal and pelvic swelling, mass and lump, unspecified site: Secondary | ICD-10-CM

## 2014-03-01 DIAGNOSIS — R140 Abdominal distension (gaseous): Secondary | ICD-10-CM

## 2014-03-01 DIAGNOSIS — R198 Other specified symptoms and signs involving the digestive system and abdomen: Secondary | ICD-10-CM

## 2014-03-01 DIAGNOSIS — N926 Irregular menstruation, unspecified: Secondary | ICD-10-CM

## 2014-03-01 MED ORDER — LIDOCAINE HCL 1 % IJ SOLN
10.0000 mL | Freq: Once | INTRAMUSCULAR | Status: AC
  Administered 2014-03-01: 10 mL

## 2014-03-01 NOTE — Patient Instructions (Signed)
Office will call you with biopsy results 

## 2014-03-01 NOTE — Progress Notes (Signed)
Wendy Levine Feb 08, 1966 [id number]        49 y.o.  G1P1001 Presents for sonohysterogram due to history of hypomenorrhea and bloating. HCG FSH TSH prolactin all returned normal.  Past medical history,surgical history, problem list, medications, allergies, family history and social history were all reviewed and documented in the EPIC chart.  Directed ROS with pertinent positives and negatives documented in the history of present illness/assessment and plan.  Exam: [name] assistant General appearance:  Normal External BUS vagina normal. Cervix normal. Uterus grossly normal midline mobile nontender. Adnexa without masses or tenderness.  Ultrasound shows uterus normal size and echotexture. Endometrial echo 5.5 mm. Right ovary with thin-walled 38 mm mean cyst with low-level internal echoes negative color flow. Left ovary normal. Cul-de-sac negative.  Sonohysterogram performed, sterile technique, paracervical block 8 cc 1% lidocaine total secondary to history of bad experience and pain with prior sonohysterogram, easy catheter introduction, good distention with no abnormalities. Endometrial sample taken. Patient tolerated well.  Assessment/Plan:  49 y.o. G1P1001 with history of relatively heavy menses now lighter 3 months with some mild irregularity and timing and abdominal bloating. Ultrasound shows small probable physiologic right ovarian cyst. Sonohysterogram without intracavitary abnormalities. Patient will follow up for biopsy results. Keep menstrual calendar for 3 months and follow up for repeat ultrasound to make sure the cyst resolves.     Anastasio Auerbach MD, 6:30 PM 03/01/2014

## 2014-03-06 ENCOUNTER — Ambulatory Visit: Payer: Self-pay | Admitting: Physician Assistant

## 2014-03-07 ENCOUNTER — Ambulatory Visit: Payer: BC Managed Care – PPO | Admitting: Gynecology

## 2014-03-07 ENCOUNTER — Other Ambulatory Visit: Payer: BC Managed Care – PPO

## 2014-03-14 ENCOUNTER — Encounter: Payer: Self-pay | Admitting: Physician Assistant

## 2014-03-14 ENCOUNTER — Ambulatory Visit (INDEPENDENT_AMBULATORY_CARE_PROVIDER_SITE_OTHER): Payer: BLUE CROSS/BLUE SHIELD | Admitting: Physician Assistant

## 2014-03-14 VITALS — BP 132/88 | HR 72 | Temp 97.7°F | Resp 16 | Ht 66.0 in | Wt 271.0 lb

## 2014-03-14 DIAGNOSIS — K2100 Gastro-esophageal reflux disease with esophagitis, without bleeding: Secondary | ICD-10-CM

## 2014-03-14 DIAGNOSIS — E669 Obesity, unspecified: Secondary | ICD-10-CM

## 2014-03-14 DIAGNOSIS — K5900 Constipation, unspecified: Secondary | ICD-10-CM

## 2014-03-14 DIAGNOSIS — E7800 Pure hypercholesterolemia, unspecified: Secondary | ICD-10-CM

## 2014-03-14 DIAGNOSIS — E559 Vitamin D deficiency, unspecified: Secondary | ICD-10-CM

## 2014-03-14 DIAGNOSIS — Z79899 Other long term (current) drug therapy: Secondary | ICD-10-CM

## 2014-03-14 DIAGNOSIS — I10 Essential (primary) hypertension: Secondary | ICD-10-CM

## 2014-03-14 LAB — CBC WITH DIFFERENTIAL/PLATELET
Basophils Absolute: 0.1 10*3/uL (ref 0.0–0.1)
Basophils Relative: 1 % (ref 0–1)
Eosinophils Absolute: 0.1 10*3/uL (ref 0.0–0.7)
Eosinophils Relative: 2 % (ref 0–5)
HCT: 39.3 % (ref 36.0–46.0)
Hemoglobin: 12.8 g/dL (ref 12.0–15.0)
Lymphocytes Relative: 25 % (ref 12–46)
Lymphs Abs: 1.5 10*3/uL (ref 0.7–4.0)
MCH: 28 pg (ref 26.0–34.0)
MCHC: 32.6 g/dL (ref 30.0–36.0)
MCV: 86 fL (ref 78.0–100.0)
MPV: 10.4 fL (ref 8.6–12.4)
Monocytes Absolute: 0.4 10*3/uL (ref 0.1–1.0)
Monocytes Relative: 7 % (ref 3–12)
Neutro Abs: 3.9 10*3/uL (ref 1.7–7.7)
Neutrophils Relative %: 65 % (ref 43–77)
Platelets: 249 10*3/uL (ref 150–400)
RBC: 4.57 MIL/uL (ref 3.87–5.11)
RDW: 14.5 % (ref 11.5–15.5)
WBC: 6 10*3/uL (ref 4.0–10.5)

## 2014-03-14 LAB — HEPATIC FUNCTION PANEL
ALT: 15 U/L (ref 0–35)
AST: 12 U/L (ref 0–37)
Albumin: 3.9 g/dL (ref 3.5–5.2)
Alkaline Phosphatase: 58 U/L (ref 39–117)
Bilirubin, Direct: 0.1 mg/dL (ref 0.0–0.3)
Indirect Bilirubin: 0.4 mg/dL (ref 0.2–1.2)
Total Bilirubin: 0.5 mg/dL (ref 0.2–1.2)
Total Protein: 6.6 g/dL (ref 6.0–8.3)

## 2014-03-14 LAB — LIPID PANEL
Cholesterol: 190 mg/dL (ref 0–200)
HDL: 52 mg/dL (ref >39)
LDL Cholesterol: 97 mg/dL (ref 0–99)
Total CHOL/HDL Ratio: 3.7 Ratio
Triglycerides: 203 mg/dL — ABNORMAL HIGH (ref <150)
VLDL: 41 mg/dL — ABNORMAL HIGH (ref 0–40)

## 2014-03-14 LAB — BASIC METABOLIC PANEL WITH GFR
BUN: 14 mg/dL (ref 6–23)
CO2: 28 mEq/L (ref 19–32)
Calcium: 8.9 mg/dL (ref 8.4–10.5)
Chloride: 103 mEq/L (ref 96–112)
Creat: 0.83 mg/dL (ref 0.50–1.10)
GFR, Est African American: >89 mL/min
GFR, Est Non African American: 84 mL/min
Glucose, Bld: 87 mg/dL (ref 70–99)
Potassium: 4.2 mEq/L (ref 3.5–5.3)
Sodium: 139 mEq/L (ref 135–145)

## 2014-03-14 LAB — MAGNESIUM: Magnesium: 2 mg/dL (ref 1.5–2.5)

## 2014-03-14 LAB — TSH: TSH: 1.642 u[IU]/mL (ref 0.350–4.500)

## 2014-03-14 NOTE — Progress Notes (Signed)
Assessment and Plan:  Hypertension: Continue medication, monitor blood pressure at home. Continue DASH diet.  Reminder to go to the ER if any CP, SOB, nausea, dizziness, severe HA, changes vision/speech, left arm numbness and tingling, and jaw pain. Cholesterol: Continue diet and exercise. Check cholesterol.  Vitamin D Def- check level and continue medications.  Obesity with co morbidities- long discussion about weight loss, diet, and exercise Constipation- Increase fiber/ water intake, decrease caffeine, increase activity level, add benefiber and will give samples on linzess, ? May also benefit from Reglan..Laboratory tests per orders. Please go to the hospital if you have severe abdominal pain, vomiting, fever, CP, SOB. May refer to GI.   GERD with questionable dark stools without petpo/iron use- start PPI/H2 blocker, gets labs, diet discussed  Continue diet and meds as discussed. Further disposition pending results of labs.   HPI 49 y.o. female  presents for 3 month follow up with hypertension, hyperlipidemia, prediabetes and vitamin D. Her blood pressure has been controlled at home, today their BP is BP: 132/88 mmHg She does workout, walking with her dad. She denies chest pain, shortness of breath, dizziness.  She is on cholesterol medication, crestor 10 and denies myalgias. Her cholesterol is at goal. The cholesterol last visit was:   Lab Results  Component Value Date   CHOL 194 08/23/2013   HDL 53 08/23/2013   LDLCALC 98 08/23/2013   TRIG 217* 08/23/2013   CHOLHDL 3.7 08/23/2013   Last A1C in the office was:  Lab Results  Component Value Date   HGBA1C 5.6 02/06/2013  BMI is Body mass index is 43.76 kg/(m^2)., she is working on diet and exercise. Wt Readings from Last 3 Encounters:  03/14/14 271 lb (122.925 kg)  01/11/14 271 lb (122.925 kg)  12/06/13 266 lb (120.657 kg)  Patient is on Vitamin D supplement.   Lab Results  Component Value Date   VD25OH 76 08/23/2013  Patient  also complains of constipation since late November. She states that she stopped her iron, xanax, gabapentin, phenergan, and phentermine, which helped a little. She went to her OB/GYN Dr. Phineas Real for AB bloating, she had normal HCG,FSH,TSH,prolactin, and normal simple cyst on right ovary with normal endometrial biopsy. She has pain meds at home but has not taken any in 2 month. She has increase fruits, veggies, Mag, water, exercise without any help. She still is on HCTZ 25. Patient called the office 2 weeks ago without a BM for 4 days, straining with BM, no blood some black/sticky stool for 2-3 days( without pepto/iron some increase spinach) , nausea, and AB cramping, took miralax 4 in a day and had a BM has been taking senokot since that time having BM every morning that is softer without straining. She has had a colonoscopy 10-15 years ago in [place], no family history of colon cancer. Denies increased stress. Only surgery is Csection.   + GERD symptoms with burning epigastric pain, some nausea.      Current Medications:  Current Outpatient Prescriptions on File Prior to Visit  Medication Sig Dispense Refill  . ALPRAZolam (XANAX) 0.5 MG tablet Take 0.5 mg by mouth. AS NEEDED WHEN FLYING    . clobetasol cream (TEMOVATE) 0.05 % Apply to affected area nightly (Patient not taking: Reported on 01/30/2014) 30 g 1  . escitalopram (LEXAPRO) 10 MG tablet TAKE ONE TABLET BY MOUTH DAILY 30 tablet 3  . gabapentin (NEURONTIN) 100 MG capsule Take 1 capsule (100 mg total) by mouth at bedtime. 30 capsule  3  . hydrochlorothiazide (HYDRODIURIL) 25 MG tablet Take 1 tablet (25 mg total) by mouth daily. 90 tablet 1  . IRON PO Take by mouth daily.    Marland Kitchen MAGNESIUM PO Take by mouth.      . OnabotulinumtoxinA (BOTOX IM) Inject into the muscle. 15-20 every 5 months    . promethazine (PHENERGAN) 25 MG tablet Take 1 tablet (25 mg total) by mouth every 6 (six) hours as needed for nausea or vomiting. 60 tablet 0  . rosuvastatin  (CRESTOR) 10 MG tablet TAKE ONE TABLET BY MOUTH THREE TIMES A WEEK    . valACYclovir (VALTREX) 500 MG tablet TAKE 1 TABLET BY MOUTH DAILY 30 tablet 1  . Vitamin D, Ergocalciferol, (DRISDOL) 50000 UNITS CAPS capsule TAKE ONE CAPSULE BY MOUTH THREE TIMES A WEEK 12 capsule 6   No current facility-administered medications on file prior to visit.   Medical History:  Past Medical History  Diagnosis Date  . ASCUS (atypical squamous cells of undetermined significance) on Pap smear     neg HR HPV 09/2008, ascus 06/2009,10/2009  . HSV-2 infection   . Hypercholesteremia   . Hypertension   . OSA on CPAP   . Anemia   . Allergy   . Prediabetes   . Anxiety    Allergies:  Allergies  Allergen Reactions  . Doxycycline Hives and Shortness Of Breath  . Adhesive [Tape]   . Amoxicillin-Pot Clavulanate Nausea Only    diarrhea  . Clindamycin/Lincomycin   . Vicodin [Hydrocodone-Acetaminophen] Itching    Review of Systems:  Review of Systems  Constitutional: Negative.  Negative for fever, chills, weight loss and malaise/fatigue.       Weight gain  HENT: Negative.  Negative for sore throat.   Eyes: Negative.   Respiratory: Negative.   Cardiovascular: Negative.   Gastrointestinal: Positive for heartburn, nausea, abdominal pain (epigastric burning, occ lower AB cramping), constipation and melena (questionable). Negative for vomiting, diarrhea and blood in stool.  Genitourinary: Negative.        Irreg heavy menses, following OB/GYN  Musculoskeletal: Negative.   Skin: Negative.  Negative for rash.  Neurological: Negative.   Psychiatric/Behavioral: Negative.    Family history- Review and unchanged Social history- Review and unchanged Physical Exam: BP 132/88 mmHg  Pulse 72  Temp(Src) 97.7 F (36.5 C)  Resp 16  Ht 5\' 6"  (1.676 m)  Wt 271 lb (122.925 kg)  BMI 43.76 kg/m2  LMP 02/22/2014 (Exact Date) Wt Readings from Last 3 Encounters:  03/14/14 271 lb (122.925 kg)  01/11/14 271 lb (122.925  kg)  12/06/13 266 lb (120.657 kg)   General Appearance: Well nourished, in no apparent distress. Eyes: PERRLA, EOMs, conjunctiva no swelling or erythema Sinuses: No Frontal/maxillary tenderness ENT/Mouth: Ext aud canals clear, TMs without erythema, bulging. No erythema, swelling, or exudate on post pharynx.  Tonsils not swollen or erythematous. Hearing normal.  Neck: Supple, thyroid normal.  Respiratory: Respiratory effort normal, BS equal bilaterally without rales, rhonchi, wheezing or stridor.  Cardio: RRR with no MRGs. Brisk peripheral pulses without edema.  Abdomen: Soft, + BS, obese, + epigastric tenderness and mild right lower AB tenderness, no guarding, rebound, hernias, masses. Lymphatics: Non tender without lymphadenopathy.  Musculoskeletal: Full ROM, 5/5 strength, normal gait.  Skin: Warm, dry without rashes, lesions, ecchymosis.  Neuro: Cranial nerves intact. Normal muscle tone, no cerebellar symptoms. Sensation intact.  Psych: Awake and oriented X 3, normal affect, Insight and Judgment appropriate.    Vicie Mutters, PA-C 9:55 AM [facility]

## 2014-03-14 NOTE — Patient Instructions (Signed)
Benefiber is good for constipation/diarrhea/irritable bowel syndrome, it helps with weight loss and can help lower your bad cholesterol. Please do 1-2 TBSP in the morning in water, coffee, or tea. It can take up to a month before you can see a difference with your bowel movements. It is cheapest from [employer], [employer], [employer].   Try linzess samples once daily 51mins before food, if this does not help we will try reglan samples.   If you get severe AB pain, vomiting, fever call the office or go to ER.   Constipation Constipation is when a person has fewer than three bowel movements a week, has difficulty having a bowel movement, or has stools that are dry, hard, or larger than normal. As people grow older, constipation is more common. If you try to fix constipation with medicines that make you have a bowel movement (laxatives), the problem may get worse. Long-term laxative use may cause the muscles of the colon to become weak. A low-fiber diet, not taking in enough fluids, and taking certain medicines may make constipation worse.  CAUSES   Certain medicines, such as antidepressants, pain medicine, iron supplements, antacids, and water pills.   Certain diseases, such as diabetes, irritable bowel syndrome (IBS), thyroid disease, or depression.   Not drinking enough water.   Not eating enough fiber-rich foods.   Stress or travel.   Lack of physical activity or exercise.   Ignoring the urge to have a bowel movement.   Using laxatives too much.  SIGNS AND SYMPTOMS   Having fewer than three bowel movements a week.   Straining to have a bowel movement.   Having stools that are hard, dry, or larger than normal.   Feeling full or bloated.   Pain in the lower abdomen.   Not feeling relief after having a bowel movement.  DIAGNOSIS  Your health care provider will take a medical history and perform a physical exam. Further testing may be done for severe constipation. Some tests  may include:  A barium enema X-ray to examine your rectum, colon, and, sometimes, your small intestine.   A sigmoidoscopy to examine your lower colon.   A colonoscopy to examine your entire colon. TREATMENT  Treatment will depend on the severity of your constipation and what is causing it. Some dietary treatments include drinking more fluids and eating more fiber-rich foods. Lifestyle treatments may include regular exercise. If these diet and lifestyle recommendations do not help, your health care provider may recommend taking over-the-counter laxative medicines to help you have bowel movements. Prescription medicines may be prescribed if over-the-counter medicines do not work.  HOME CARE INSTRUCTIONS   Eat foods that have a lot of fiber, such as fruits, vegetables, whole grains, and beans.  Limit foods high in fat and processed sugars, such as french fries, hamburgers, cookies, candies, and soda.   A fiber supplement may be added to your diet if you cannot get enough fiber from foods.   Drink enough fluids to keep your urine clear or pale yellow.   Exercise regularly or as directed by your health care provider.   Go to the restroom when you have the urge to go. Do not hold it.   Only take over-the-counter or prescription medicines as directed by your health care provider. Do not take other medicines for constipation without talking to your health care provider first.  [place] IF:   You have bright red blood in your stool.   Your constipation lasts for  more than 4 days or gets worse.   You have abdominal or rectal pain.   You have thin, pencil-like stools.   You have unexplained weight loss. MAKE SURE YOU:   Understand these instructions.  Will watch your condition.  Will get help right away if you are not doing well or get worse. Document Released: 11/08/2003 Document Revised: 02/14/2013 Document Reviewed: 11/21/2012 [facility] Patient  Information 2015 [place], [place]. This information is not intended to replace advice given to you by your health care provider. Make sure you discuss any questions you have with your health care provider.  Take omeprazole over the counter for 2 weeks, then go to zantac 150-300 mg at night for 2 weeks, then you can stop.  Avoid alcohol, spicy foods, NSAIDS (aleve, ibuprofen) at this time. See foods below.   Food Choices for Gastroesophageal Reflux Disease When you have gastroesophageal reflux disease (GERD), the foods you eat and your eating habits are very important. Choosing the right foods can help ease the discomfort of GERD. WHAT GENERAL GUIDELINES DO I NEED TO FOLLOW?  Choose fruits, vegetables, whole grains, low-fat dairy products, and low-fat meat, fish, and poultry.  Limit fats such as oils, salad dressings, butter, nuts, and avocado.  Keep a food diary to identify foods that cause symptoms.  Avoid foods that cause reflux. These may be different for different people.  Eat frequent small meals instead of three large meals each day.  Eat your meals slowly, in a relaxed setting.  Limit fried foods.  Cook foods using methods other than frying.  Avoid drinking alcohol.  Avoid drinking large amounts of liquids with your meals.  Avoid bending over or lying down until 2-3 hours after eating. WHAT FOODS ARE NOT RECOMMENDED? The following are some foods and drinks that may worsen your symptoms: Vegetables Tomatoes. Tomato juice. Tomato and spaghetti sauce. Chili peppers. Onion and garlic. Horseradish. Fruits Oranges, grapefruit, and lemon (fruit and juice). Meats High-fat meats, fish, and poultry. This includes hot dogs, ribs, ham, sausage, salami, and bacon. Dairy Whole milk and chocolate milk. Sour cream. Cream. Butter. Ice cream. Cream cheese.  Beverages Coffee and tea, with or without caffeine. Carbonated beverages or energy drinks. Condiments Hot sauce. Barbecue sauce.   Sweets/Desserts Chocolate and cocoa. Donuts. Peppermint and spearmint. Fats and Oils High-fat foods, including [place] fries and potato chips. Other Vinegar. Strong spices, such as black pepper, white pepper, red pepper, cayenne, curry powder, cloves, ginger, and chili powder.

## 2014-03-15 LAB — VITAMIN D 25 HYDROXY (VIT D DEFICIENCY, FRACTURES): Vit D, 25-Hydroxy: 50 ng/mL (ref 30–100)

## 2014-03-15 LAB — H. PYLORI BREATH TEST: H. pylori Breath Test: NOT DETECTED

## 2014-03-15 LAB — SEDIMENTATION RATE: Sed Rate: 5 mm/hr (ref 0–22)

## 2014-03-21 ENCOUNTER — Other Ambulatory Visit: Payer: Self-pay | Admitting: Gynecology

## 2014-03-31 ENCOUNTER — Other Ambulatory Visit: Payer: Self-pay | Admitting: Physician Assistant

## 2014-04-18 ENCOUNTER — Encounter: Payer: Self-pay | Admitting: Physician Assistant

## 2014-04-19 ENCOUNTER — Other Ambulatory Visit: Payer: Self-pay | Admitting: Physician Assistant

## 2014-05-30 ENCOUNTER — Other Ambulatory Visit: Payer: Self-pay | Admitting: Gynecology

## 2014-05-30 ENCOUNTER — Ambulatory Visit (INDEPENDENT_AMBULATORY_CARE_PROVIDER_SITE_OTHER): Payer: BLUE CROSS/BLUE SHIELD | Admitting: Gynecology

## 2014-05-30 ENCOUNTER — Encounter: Payer: Self-pay | Admitting: Gynecology

## 2014-05-30 ENCOUNTER — Ambulatory Visit (INDEPENDENT_AMBULATORY_CARE_PROVIDER_SITE_OTHER): Payer: BLUE CROSS/BLUE SHIELD

## 2014-05-30 VITALS — BP 124/76

## 2014-05-30 DIAGNOSIS — N83201 Unspecified ovarian cyst, right side: Secondary | ICD-10-CM

## 2014-05-30 DIAGNOSIS — N926 Irregular menstruation, unspecified: Secondary | ICD-10-CM | POA: Diagnosis not present

## 2014-05-30 DIAGNOSIS — N8320 Unspecified ovarian cysts: Secondary | ICD-10-CM | POA: Diagnosis not present

## 2014-05-30 NOTE — Progress Notes (Signed)
Wendy Levine 12-31-1965 [id number]        49 y.o.  G1P1001 presents for follow up ultrasound with history of irregular menses and sonohysterogram 02/2014 showing a 38 mm right ovarian cyst. Patient notes that her menses are now regular. She is not having pain or other symptoms.  Past medical history,surgical history, problem list, medications, allergies, family history and social history were all reviewed and documented in the EPIC chart.  Directed ROS with pertinent positives and negatives documented in the history of present illness/assessment and plan.  Exam: Filed Vitals:   05/30/14 1037  BP: 124/76   Ultrasound shows uterus normal size and echotexture. Endometrial echo 7 mm. Right and left ovaries normal with no evidence of prior right ovarian cyst. Cul-de-sac negative.  Assessment/Plan:  49 y.o. G1P1001 with resolution of her right ovarian cyst. Patient will keep menstrual calendar as long as regular menses will follow up when she is due for her annual exam, sooner as needed.    Anastasio Auerbach MD, 10:48 AM 05/30/2014

## 2014-05-30 NOTE — Patient Instructions (Signed)
Follow up for your annual exam when due. Sooner if any issues.

## 2014-06-08 ENCOUNTER — Encounter: Payer: Self-pay | Admitting: Physician Assistant

## 2014-06-08 ENCOUNTER — Ambulatory Visit (INDEPENDENT_AMBULATORY_CARE_PROVIDER_SITE_OTHER): Payer: BLUE CROSS/BLUE SHIELD | Admitting: Physician Assistant

## 2014-06-08 VITALS — BP 136/90 | HR 64 | Temp 98.6°F | Resp 18 | Ht 66.0 in | Wt 272.0 lb

## 2014-06-08 DIAGNOSIS — J209 Acute bronchitis, unspecified: Secondary | ICD-10-CM

## 2014-06-08 MED ORDER — ALBUTEROL SULFATE 108 (90 BASE) MCG/ACT IN AEPB: 2.0000 | INHALATION_SPRAY | Freq: Four times a day (QID) | RESPIRATORY_TRACT | Status: DC | PRN

## 2014-06-08 MED ORDER — AZITHROMYCIN 250 MG PO TABS: ORAL_TABLET | ORAL | Status: AC

## 2014-06-08 MED ORDER — HYDROCODONE-ACETAMINOPHEN 5-325 MG PO TABS: 1.0000 | ORAL_TABLET | Freq: Four times a day (QID) | ORAL | Status: DC | PRN

## 2014-06-08 NOTE — Progress Notes (Signed)
Subjective:    Patient ID: Wendy Levine, female    DOB: 1965/08/16, 49 y.o.   MRN: [id number]  HPI 49 y.o. female with history of HTN, chol, obesity presents with cough. Started on vacation in [place], started with fever 103 x 3 days, cough, sinus headache, left ear pain, muscle aches, started to resolved at 7-10days but then got progressively worse now with cough, yellow mucus, fatigue, chest tightness  and she leaves for [facility] June 22nd. No one else in the house is sick. Had percocet taking at night that helps.   Current Outpatient Prescriptions on File Prior to Visit  Medication Sig Dispense Refill  . ALPRAZolam (XANAX) 0.5 MG tablet Take 0.5 mg by mouth. AS NEEDED WHEN FLYING    . Cyanocobalamin (VITAMIN B-12 PO) Take 3,000 mcg by mouth daily.    Marland Kitchen escitalopram (LEXAPRO) 10 MG tablet TAKE ONE TABLET BY MOUTH DAILY 30 tablet 2  . hydrochlorothiazide (HYDRODIURIL) 25 MG tablet Take 1 tablet (25 mg total) by mouth daily. 90 tablet 1  . IRON PO Take by mouth daily.    Marland Kitchen MAGNESIUM PO Take by mouth.      . OnabotulinumtoxinA (BOTOX IM) Inject into the muscle. 15-20 every 5 months    . rosuvastatin (CRESTOR) 10 MG tablet TAKE ONE TABLET BY MOUTH THREE TIMES A WEEK    . valACYclovir (VALTREX) 500 MG tablet TAKE 1 TABLET BY MOUTH DAILY 30 tablet 5  . Vitamin D, Ergocalciferol, (DRISDOL) 50000 UNITS CAPS capsule TAKE ONE CAPSULE BY MOUTH THREE TIMES A WEEK 12 capsule 6   No current facility-administered medications on file prior to visit.   Past Medical History  Diagnosis Date  . ASCUS (atypical squamous cells of undetermined significance) on Pap smear     neg HR HPV 09/2008, ascus 06/2009,10/2009  . HSV-2 infection   . Hypercholesteremia   . Hypertension   . OSA on CPAP   . Anemia   . Allergy   . Prediabetes   . Anxiety     Review of Systems  Constitutional: Positive for fever and fatigue. Negative for chills, diaphoresis and appetite change.  HENT: Positive for congestion,  postnasal drip, sinus pressure and sneezing. Negative for dental problem, drooling, ear discharge, ear pain, facial swelling, hearing loss, mouth sores, nosebleeds, rhinorrhea, sore throat, tinnitus and trouble swallowing.   Respiratory: Positive for cough and chest tightness. Negative for apnea, choking, shortness of breath, wheezing and stridor.   Cardiovascular: Negative.   Gastrointestinal: Negative.   Genitourinary: Negative.   Musculoskeletal: Negative for neck pain.  Neurological: Positive for headaches.       Objective:   Physical Exam  Constitutional: She is oriented to person, place, and time. She appears well-developed and well-nourished.  HENT:  Right Ear: Hearing and external ear normal. No mastoid tenderness. Tympanic membrane is injected. Tympanic membrane is not perforated, not erythematous, not retracted and not bulging. A middle ear effusion is present.  Left Ear: Hearing and external ear normal. No mastoid tenderness. Tympanic membrane is injected. Tympanic membrane is not perforated, not erythematous, not retracted and not bulging. A middle ear effusion is present.  Nose: Right sinus exhibits maxillary sinus tenderness. Left sinus exhibits maxillary sinus tenderness.  Mouth/Throat: Uvula is midline, oropharynx is clear and moist and mucous membranes are normal.  Eyes: Conjunctivae and EOM are normal. Pupils are equal, round, and reactive to light.  Neck: Neck supple.  Cardiovascular: Normal rate and regular rhythm.   Pulmonary/Chest: Effort normal and breath  sounds normal. No respiratory distress. She has no wheezes.  Abdominal: Soft. Bowel sounds are normal.  Musculoskeletal: Normal range of motion.  Lymphadenopathy:    She has no cervical adenopathy.  Neurological: She is alert and oriented to person, place, and time.  Skin: Skin is warm and dry.      Assessment & Plan:  1. Acute bronchitis, unspecified organism [J20.9] Follow up at [facility] next week -  HYDROcodone-acetaminophen (NORCO) 5-325 MG per tablet; Take 1 tablet by mouth every 6 (six) hours as needed (Cough). Max: 4 tablets a day  Dispense: 45 tablet; Refill: 0 - azithromycin (ZITHROMAX) 250 MG tablet; Take 2 tablets (500 mg) on  Day 1,  followed by 1 tablet (250 mg) once daily on Days 2 through 5.  Dispense: 6 each; Refill: 1 - Albuterol Sulfate (PROAIR RESPICLICK) 583 (90 BASE) MCG/ACT AEPB; Inhale 2 Act into the lungs every 6 (six) hours as needed (for shortness of breath).  Dispense: 1 each; Refill: 0

## 2014-06-08 NOTE — Patient Instructions (Signed)
  If you are traveling you can take these medications to be more prepared. If you get chest pain, shortness of breath or abdominal pain please go to the hospital wherever you may be.   Ciprofloxacin is good for travelers diarrhea, you can take 2 pills a day for 7 days. Or it is also good for urinary tract infections, you can take 2 a day for 7 days.  Zpak- is good for sinus infections please finish as prescribed Phenergran is for nausea but it sedating so plan on eating and sleeping.  Levsin is good for nausea, diarrhea, or abdominal cramping- it can constipate you so don't take too much. This dissolves under your tongue.  Prednisone is good for joint pain or rashes or spider bites- you can take it as prescribed but if you are feeling better you can stop it early.  Diflucan is for a yeast infection.    HOW TO TREAT VIRAL COUGH AND COLD SYMPTOMS:  -Symptoms usually last at least 1 week with the worst symptoms being around day 4.  - colds usually start with a sore throat and end with a cough, and the cough can take 2 weeks to get better.  -No antibiotics are needed for colds, flu, sore throats, cough, bronchitis UNLESS symptoms are longer than 7 days OR if you are getting better then get drastically worse.  -There are a lot of combination medications (Dayquil, Nyquil, Vicks 44, tyelnol cold and sinus, ETC). Please look at the ingredients on the back so that you are treating the correct symptoms and not doubling up on medications/ingredients.    Medicines you can use  Nasal congestion  - pseudoephedrine (Sudafed)- behind the counter, do not use if you have high blood pressure, medicine that have -D in them.  - phenylephrine (Sudafed PE) -Dextormethorphan + chlorpheniramine (Coridcidin HBP)- okay if you have high blood pressure -Oxymetazoline (Afrin) nasal spray- LIMIT to 3 days -Saline nasal spray -Neti pot (used distilled or bottled water)  Ear pain/congestion  -pseudoephedrine (sudafed) -  Nasonex/flonase nasal spray  Fever  -Acetaminophen (Tyelnol) -Ibuprofen (Advil, motrin, aleve)  Sore Throat  -Acetaminophen (Tyelnol) -Ibuprofen (Advil, motrin, aleve) -Drink a lot of water -Gargle with salt water - Rest your voice (don't talk) -Throat sprays -Cough drops  Body Aches  -Acetaminophen (Tyelnol) -Ibuprofen (Advil, motrin, aleve)  Headache  -Acetaminophen (Tyelnol) -Ibuprofen (Advil, motrin, aleve) - Exedrin, Exedrin Migraine  Allergy symptoms (cough, sneeze, runny nose, itchy eyes) -Claritin or loratadine cheapest but likely the weakest  -Zyrtec or certizine at night because it can make you sleepy -The strongest is allegra or fexafinadine  Cheapest at [employer], [employer], [employer]  Cough  -Dextromethorphan (Delsym)- medicine that has DM in it -Guafenesin (Mucinex/Robitussin) - cough drops - drink lots of water  Chest Congestion  -Guafenesin (Mucinex/Robitussin)  Red Itchy Eyes  - Naphcon-A  Upset Stomach  - Bland diet (nothing spicy, greasy, fried, and high acid foods like tomatoes, oranges, berries) -OKAY- cereal, bread, soup, crackers, rice -Eat smaller more frequent meals -reduce caffeine, no alcohol -Loperamide (Imodium-AD) if diarrhea -Prevacid for heart burn  General health when sick  -Hydration -wash your hands frequently -keep surfaces clean -change pillow cases and sheets often -Get fresh air but do not exercise strenuously -Vitamin D, double up on it - Vitamin C -Zinc

## 2014-06-10 ENCOUNTER — Encounter: Payer: Self-pay | Admitting: Physician Assistant

## 2014-06-11 ENCOUNTER — Encounter: Payer: Self-pay | Admitting: Physician Assistant

## 2014-06-12 ENCOUNTER — Other Ambulatory Visit: Payer: Self-pay | Admitting: Internal Medicine

## 2014-06-19 ENCOUNTER — Ambulatory Visit (INDEPENDENT_AMBULATORY_CARE_PROVIDER_SITE_OTHER): Payer: BLUE CROSS/BLUE SHIELD | Admitting: Physician Assistant

## 2014-06-19 ENCOUNTER — Encounter: Payer: Self-pay | Admitting: Physician Assistant

## 2014-06-19 VITALS — BP 138/80 | HR 64 | Temp 97.9°F | Resp 16 | Ht 66.0 in | Wt 275.0 lb

## 2014-06-19 DIAGNOSIS — K219 Gastro-esophageal reflux disease without esophagitis: Secondary | ICD-10-CM

## 2014-06-19 DIAGNOSIS — E669 Obesity, unspecified: Secondary | ICD-10-CM

## 2014-06-19 DIAGNOSIS — Z79899 Other long term (current) drug therapy: Secondary | ICD-10-CM

## 2014-06-19 DIAGNOSIS — IMO0002 Reserved for concepts with insufficient information to code with codable children: Secondary | ICD-10-CM

## 2014-06-19 DIAGNOSIS — E559 Vitamin D deficiency, unspecified: Secondary | ICD-10-CM

## 2014-06-19 DIAGNOSIS — E8881 Metabolic syndrome: Secondary | ICD-10-CM

## 2014-06-19 DIAGNOSIS — I10 Essential (primary) hypertension: Secondary | ICD-10-CM

## 2014-06-19 DIAGNOSIS — E7800 Pure hypercholesterolemia, unspecified: Secondary | ICD-10-CM

## 2014-06-19 DIAGNOSIS — M1711 Unilateral primary osteoarthritis, right knee: Secondary | ICD-10-CM

## 2014-06-19 DIAGNOSIS — D649 Anemia, unspecified: Secondary | ICD-10-CM

## 2014-06-19 DIAGNOSIS — R896 Abnormal cytological findings in specimens from other organs, systems and tissues: Secondary | ICD-10-CM

## 2014-06-19 DIAGNOSIS — Z0001 Encounter for general adult medical examination with abnormal findings: Secondary | ICD-10-CM

## 2014-06-19 DIAGNOSIS — G4733 Obstructive sleep apnea (adult) (pediatric): Secondary | ICD-10-CM

## 2014-06-19 DIAGNOSIS — R6889 Other general symptoms and signs: Secondary | ICD-10-CM

## 2014-06-19 DIAGNOSIS — M2141 Flat foot [pes planus] (acquired), right foot: Secondary | ICD-10-CM

## 2014-06-19 DIAGNOSIS — N943 Premenstrual tension syndrome: Secondary | ICD-10-CM

## 2014-06-19 DIAGNOSIS — E88819 Insulin resistance, unspecified: Secondary | ICD-10-CM

## 2014-06-19 DIAGNOSIS — Z6841 Body Mass Index (BMI) 40.0 and over, adult: Secondary | ICD-10-CM | POA: Insufficient documentation

## 2014-06-19 DIAGNOSIS — B009 Herpesviral infection, unspecified: Secondary | ICD-10-CM

## 2014-06-19 DIAGNOSIS — M2142 Flat foot [pes planus] (acquired), left foot: Secondary | ICD-10-CM

## 2014-06-19 LAB — CBC WITH DIFFERENTIAL/PLATELET
Basophils Absolute: 0.1 10*3/uL (ref 0.0–0.1)
Basophils Relative: 1 % (ref 0–1)
Eosinophils Absolute: 0.1 10*3/uL (ref 0.0–0.7)
Eosinophils Relative: 1 % (ref 0–5)
HCT: 36.7 % (ref 36.0–46.0)
Hemoglobin: 12 g/dL (ref 12.0–15.0)
Lymphocytes Relative: 19 % (ref 12–46)
Lymphs Abs: 1.7 10*3/uL (ref 0.7–4.0)
MCH: 28 pg (ref 26.0–34.0)
MCHC: 32.7 g/dL (ref 30.0–36.0)
MCV: 85.5 fL (ref 78.0–100.0)
MPV: 10.4 fL (ref 8.6–12.4)
Monocytes Absolute: 0.4 10*3/uL (ref 0.1–1.0)
Monocytes Relative: 5 % (ref 3–12)
Neutro Abs: 6.4 10*3/uL (ref 1.7–7.7)
Neutrophils Relative %: 74 % (ref 43–77)
Platelets: 259 10*3/uL (ref 150–400)
RBC: 4.29 MIL/uL (ref 3.87–5.11)
RDW: 14.4 % (ref 11.5–15.5)
WBC: 8.7 10*3/uL (ref 4.0–10.5)

## 2014-06-19 LAB — HEMOGLOBIN A1C
Hgb A1c MFr Bld: 5.7 % — ABNORMAL HIGH (ref <5.7)
Mean Plasma Glucose: 117 mg/dL — ABNORMAL HIGH (ref <117)

## 2014-06-19 MED ORDER — FLUCONAZOLE 150 MG PO TABS: 150.0000 mg | ORAL_TABLET | Freq: Every day | ORAL | Status: DC

## 2014-06-19 MED ORDER — CIPROFLOXACIN HCL 500 MG PO TABS: 500.0000 mg | ORAL_TABLET | Freq: Two times a day (BID) | ORAL | Status: DC

## 2014-06-19 MED ORDER — ALPRAZOLAM 0.5 MG PO TABS: 0.5000 mg | ORAL_TABLET | Freq: Two times a day (BID) | ORAL | Status: DC | PRN

## 2014-06-19 MED ORDER — PROMETHAZINE HCL 25 MG PO TABS: 25.0000 mg | ORAL_TABLET | Freq: Four times a day (QID) | ORAL | Status: DC | PRN

## 2014-06-19 NOTE — Patient Instructions (Addendum)
Can call Dr. Toy Cookey to get evaluated for dental sleep appliance. # [phone]  OR you can try Dr. Clearence Ped in [facility] # [phone]  We will send notes. Call and get price quote on both.   Before you even begin to attack a weight-loss plan, it pays to remember this: You are not fat. You have fat. Losing weight isn't about blame or shame; it's simply another achievement to accomplish. Dieting is like any other skill-you have to buckle down and work at it. As long as you act in a smart, reasonable way, you'll ultimately get where you want to be. Here are some weight loss pearls for you.  1. It's Not a Diet. It's a Lifestyle Thinking of a diet as something you're on and suffering through only for the short term doesn't work. To shed weight and keep it off, you need to make permanent changes to the way you eat. It's OK to indulge occasionally, of course, but if you cut calories temporarily and then revert to your old way of eating, you'll gain back the weight quicker than you can say yo-yo. Use it to lose it. Research shows that one of the best predictors of long-term weight loss is how many pounds you drop in the first month. For that reason, nutritionists often suggest being stricter for the first two weeks of your new eating strategy to build momentum. Cut out added sugar and alcohol and avoid unrefined carbs. After that, figure out how you can reincorporate them in a way that's healthy and maintainable.  2. There's a Right Way to Exercise Working out burns calories and fat and boosts your metabolism by building muscle. But those trying to lose weight are notorious for overestimating the number of calories they burn and underestimating the amount they take in. Unfortunately, your system is biologically programmed to hold on to extra pounds and that means when you start exercising, your body senses the deficit and ramps up its hunger signals. If you're not diligent, you'll eat everything you  burn and then some. Use it to lose it. Cardio gets all the exercise glory, but strength and interval training are the real heroes. They help you build lean muscle, which in turn increases your metabolism and calorie-burning ability 3. Don't Overreact to Mild Hunger Some people have a hard time losing weight because of hunger anxiety. To them, being hungry is bad-something to be avoided at all costs-so they carry snacks with them and eat when they don't need to. Others eat because they're stressed out or bored. While you never want to get to the point of being ravenous (that's when bingeing is likely to happen), a hunger pang, a craving, or the fact that it's 3:00 p.m. should not send you racing for the vending machine or obsessing about the energy bar in your purse. Ideally, you should put off eating until your stomach is growling and it's difficult to concentrate.  Use it to lose it. When you feel the urge to eat, use the HALT method. Ask yourself, Am I really hungry? Or am I angry or anxious, lonely or bored, or tired? If you're still not certain, try the apple test. If you're truly hungry, an apple should seem delicious; if it doesn't, something else is going on. Or you can try drinking water and making yourself busy, if you are still hungry try a healthy snack.  4. Not All Calories Are Created Equal The mechanics of weight loss are  pretty simple: Take in fewer calories than you use for energy. But the kind of food you eat makes all the difference. Processed food that's high in saturated fat and refined starch or sugar can cause inflammation that disrupts the hormone signals that tell your brain you're full. The result: You eat a lot more.  Use it to lose it. Clean up your diet. Swap in whole, unprocessed foods, including vegetables, lean protein, and healthy fats that will fill you up and give you the biggest nutritional bang for your calorie buck. In a few weeks, as your brain starts receiving regular  hunger and fullness signals once again, you'll notice that you feel less hungry overall and naturally start cutting back on the amount you eat.  5. Protein, Produce, and Plant-Based Fats Are Your Weight-Loss Trinity Here's why eating the three Ps regularly will help you drop pounds. Protein fills you up. You need it to build lean muscle, which keeps your metabolism humming so that you can torch more fat. People in a weight-loss program who ate double the recommended daily allowance for protein (about 110 grams for a 150-pound woman) lost 70 percent of their weight from fat, while people who ate the RDA lost only about 40 percent, one study found. Produce is packed with filling fiber. "It's very difficult to consume too many calories if you're eating a lot of vegetables. Example: Three cups of broccoli is a lot of food, yet only 93 calories. (Fruit is another story. It can be easy to overeat and can contain a lot of calories from sugar, so be sure to monitor your intake.) Plant-based fats like olive oil and those in avocados and nuts are healthy and extra satiating.  Use it to lose it. Aim to incorporate each of the three Ps into every meal and snack. People who eat protein throughout the day are able to keep weight off, according to a study in the [place] of Clinical Nutrition. In addition to meat, poultry and seafood, good sources are beans, lentils, eggs, tofu, and yogurt. As for fat, keep portion sizes in check by measuring out salad dressing, oil, and nut butters (shoot for one to two tablespoons). Finally, eat veggies or a little fruit at every meal. People who did that consumed 308 fewer calories but didn't feel any hungrier than when they didn't eat more produce.  7. How You Eat Is As Important As What You Eat In order for your brain to register that you're full, you need to focus on what you're eating. Sit down whenever you eat, preferably at a table. Turn off the TV or computer, put down  your phone, and look at your food. Smell it. Chew slowly, and don't put another bite on your fork until you swallow. When women ate lunch this attentively, they consumed 30 percent less when snacking later than those who listened to an audiobook at lunchtime, according to a study in the [place] of Nutrition. 8. Weighing Yourself Really Works The scale provides the best evidence about whether your efforts are paying off. Seeing the numbers tick up or down or stagnate is motivation to keep going-or to rethink your approach. A 2015 study at [facility] found that daily weigh-ins helped people lose more weight, keep it off, and maintain that loss, even after two years. Use it to lose it. Step on the scale at the same time every day for the best results. If your weight shoots up several pounds from one weigh-in  to the next, don't freak out. Eating a lot of salt the night before or having your period is the likely culprit. The number should return to normal in a day or two. It's a steady climb that you need to do something about. 9. Too Much Stress and Too Little Sleep Are Your Enemies When you're tired and frazzled, your body cranks up the production of cortisol, the stress hormone that can cause carb cravings. Not getting enough sleep also boosts your levels of ghrelin, a hormone associated with hunger, while suppressing leptin, a hormone that signals fullness and satiety. People on a diet who slept only five and a half hours a night for two weeks lost 55 percent less fat and were hungrier than those who slept eight and a half hours, according to a study in the [place]. Use it to lose it. Prioritize sleep, aiming for seven hours or more a night, which research shows helps lower stress. And make sure you're getting quality zzz's. If a snoring spouse or a fidgety cat wakes you up frequently throughout the night, you may end up getting the equivalent of just four hours of  sleep, according to a study from [facility]. Keep pets out of the bedroom, and use a white-noise app to drown out snoring. 10. You Will Hit a plateau-And You Can Bust Through It As you slim down, your body releases much less leptin, the fullness hormone.  If you're not strength training, start right now. Building muscle can raise your metabolism to help you overcome a plateau. To keep your body challenged and burning calories, incorporate new moves and more intense intervals into your workouts or add another sweat session to your weekly routine. Alternatively, cut an extra 100 calories or so a day from your diet. Now that you've lost weight, your body simply doesn't need as much fuel.   Ways to cut 100 calories  1. Eat your eggs with hot sauce OR salsa instead of cheese.  Eggs are great for breakfast, but many people consider eggs and cheese to be BFFs. Instead of cheese-1 oz. of cheddar has 114 calories-top your eggs with hot sauce, which contains no calories and helps with satiety and metabolism. Salsa is also a great option!!  2. Top your toast, waffles or pancakes with mashed berries instead of jelly or syrup. Half a cup of berries-fresh, frozen or thawed-has about 40 calories, compared with 2 tbsp. of maple syrup or jelly, which both have about 100 calories. The berries will also give you a good punch of fiber, which helps keep you full and satisfied and won't spike blood sugar quickly like the jelly or syrup. 3. Swap the non-fat latte for black coffee with a splash of half-and-half. Contrary to its name, that non-fat latte has 130 calories and a startling 19g of carbohydrates per 16 oz. serving. Replacing that 'light' drinkable dessert with a black coffee with a splash of half-and-half saves you more than 100 calories per 16 oz. serving. 4. Sprinkle salads with freeze-dried raspberries instead of dried cranberries. If you want a sweet addition to your nutritious salad, stay away from dried  cranberries. They have a whopping 130 calories per  cup and 30g carbohydrates. Instead, sprinkle freeze-dried raspberries guilt-free and save more than 100 calories per  cup serving, adding 3g of belly-filling fiber. 5. Go for mustard in place of mayo on your sandwich. Mustard can add really nice flavor to any sandwich, and there are tons of  varieties, from spicy to honey. A serving of mayo is 95 calories, versus 10 calories in a serving of mustard. 6. Choose a DIY salad dressing instead of the store-bought kind. Mix Dijon or whole grain mustard with low-fat Kefir or red wine vinegar and garlic. 7. Use hummus as a spread instead of a dip. Use hummus as a spread on a high-fiber cracker or tortilla with a sandwich and save on calories without sacrificing taste. 8. Pick just one salad "accessory." Salad isn't automatically a calorie winner. It's easy to over-accessorize with toppings. Instead of topping your salad with nuts, avocado and cranberries (all three will clock in at 313 calories), just pick one. The next day, choose a different accessory, which will also keep your salad interesting. You don't wear all your jewelry every day, right? 9. Ditch the white pasta in favor of spaghetti squash. One cup of cooked spaghetti squash has about 40 calories, compared with traditional spaghetti, which comes with more than 200. Spaghetti squash is also nutrient-dense. It's a good source of fiber and Vitamins A and C, and it can be eaten just like you would eat pasta-with a great tomato sauce and [place] meatballs or with pesto, tofu and spinach, for example. 10. Dress up your chili, soups and stews with non-fat [place] yogurt instead of sour cream. Just a 'dollop' of sour cream can set you back 115 calories and a whopping 12g of fat-seven of which are of the artery-clogging variety. Added bonus: [place] yogurt is packed with muscle-building protein, calcium and B Vitamins. 11. Mash cauliflower instead of mashed  potatoes. One cup of traditional mashed potatoes-in all their creamy goodness-has more than 200 calories, compared to mashed cauliflower, which you can typically eat for less than 100 calories per 1 cup serving. Cauliflower is a great source of the antioxidant indole-3-carbinol (I3C), which may help reduce the risk of some cancers, like breast cancer. 12. Ditch the ice cream sundae in favor of a [place] yogurt parfait. Instead of a cup of ice cream or fro-yo for dessert, try 1 cup of nonfat Greek yogurt topped with fresh berries and a sprinkle of cacao nibs. Both toppings are packed with antioxidants, which can help reduce cellular inflammation and oxidative damage. And the comparison is a no-brainer: One cup of ice cream has about 275 calories; one cup of frozen yogurt has about 230; and a cup of Greek yogurt has just 130, plus twice the protein, so you're less likely to return to the freezer for a second helping. 13. Put olive oil in a spray container instead of using it directly from the bottle. Each tablespoon of olive oil is 120 calories and 15g of fat. Use a mister instead of pouring it straight into the pan or onto a salad. This allows for portion control and will save you more than 100 calories. 14. When baking, substitute canned pumpkin for butter or oil. Canned pumpkin-not pumpkin pie mix-is loaded with Vitamin A, which is important for skin and eye health, as well as immunity. And the comparisons are pretty crazy:  cup of canned pumpkin has about 40 calories, compared to butter or oil, which has more than 800 calories. Yes, 800 calories. Applesauce and mashed banana can also serve as good substitutions for butter or oil, usually in a 1:1 ratio. 15. Top casseroles with high-fiber cereal instead of breadcrumbs. Breadcrumbs are typically made with white bread, while breakfast cereals contain 5-9g of fiber per serving. Not only will you save more than 150  calories per  cup serving, the swap will also  keep you more full and you'll get a metabolism boost from the added fiber. 16. Snack on pistachios instead of macadamia nuts. Believe it or not, you get the same amount of calories from 35 pistachios (100 calories) as you would from only five macadamia nuts. 17. Chow down on kale chips rather than potato chips. This is my favorite 'don't knock it 'till you try it' swap. Kale chips are so easy to make at home, and you can spice them up with a little grated parmesan or chili powder. Plus, they're a mere fraction of the calories of potato chips, but with the same crunch factor we crave so often. 18. Add seltzer and some fruit slices to your cocktail instead of soda or fruit juice. One cup of soda or fruit juice can pack on as much as 140 calories. Instead, use seltzer and fruit slices. The fruit provides valuable phytochemicals, such as flavonoids and anthocyanins, which help to combat cancer and stave off the aging process.

## 2014-06-19 NOTE — Progress Notes (Signed)
Complete Physical  Assessment and Plan: 1. Essential hypertension - continue medications, DASH diet, exercise and monitor at home. Call if greater than 130/80.  - CBC with Differential/Platelet - BASIC METABOLIC PANEL WITH GFR - Hepatic function panel - TSH - Urinalysis, Routine w reflex microscopic - Microalbumin / creatinine urine ratio - EKG 12-Lead  2. Hypercholesteremia -continue medications, check lipids, decrease fatty foods, increase activity.  - Lipid panel  3. Obesity Obesity with co morbidities- long discussion about weight loss, diet, and exercise  4. Insulin resistance Discussed general issues about diabetes pathophysiology and management., Educational material distributed., Suggested low cholesterol diet., Encouraged aerobic exercise., Discussed foot care., Reminded to get yearly retinal exam. - Hemoglobin A1c - Insulin, fasting  5. Primary localized osteoarthrosis, lower leg, right Continue swimming, follow up ortho PRN  6. HSV-2 infection Refill med PRN  7. ASCUS favoring benign Fu OB/GY[facility]  8. Pes planus of both feet Wear better shoes, weight loss advised  9. Gastroesophageal reflux disease without esophagitis Continue PPI/H2 blocker, diet discussed  10. OSA (obstructive sleep apnea) Sleep apnea- continue CPAP, weight loss advised, since she travels a lot given + for Dr. Toy Cookey for possible mouth piece.   11. Vitamin D deficiency - Vit D  25 hydroxy (rtn osteoporosis monitoring)  12. Medication management - Magnesium  13. Anemia, unspecified anemia type - Iron and TIBC - Ferritin - Vitamin B12  14. PMS (premenstrual syndrome) Continue lexapro  Discussed med's effects and SE's. Screening labs and tests as requested with regular follow-up as recommended. Over 40 minutes of exam, counseling, chart review, and critical decision making was performed this visit.   HPI  49 y.o. female  presents for a complete physical.  Her blood pressure has  been controlled at home, today their BP is BP: 138/80 mmHg She does not workout. She denies chest pain, shortness of breath, dizziness.  She is on cholesterol medication, crestor 10mg  and denies myalgias. Her cholesterol is at goal. The cholesterol last visit was:   Lab Results  Component Value Date   CHOL 190 03/14/2014   HDL 52 03/14/2014   LDLCALC 97 03/14/2014   TRIG 203* 03/14/2014   CHOLHDL 3.7 03/14/2014   She has been working on diet and exercise for insulin resistance, and denies paresthesia of the feet, polydipsia, polyuria and visual disturbances. Last A1C in the office was:  Lab Results  Component Value Date   HGBA1C 5.6 02/06/2013  Patient is on Vitamin D supplement.   Lab Results  Component Value Date   VD25OH 63 03/14/2014   She has a history of blood loss anemia due to menses, on iron which helps with energy but she has been off of it due to AB pain, just recently started back on it for 3-4 days.  She is on lexapro for PMS which helps.  She is going to [place], June 22-July 8th and would like some travel medications.  Has history of GERD.   She does have a history of ASCIS 2011.  BMI is Body mass index is 44.41 kg/(m^2)., she is working on diet and exercise. She has a history of OSA on CPAP and sleeps well.  Wt Readings from Last 3 Encounters:  06/19/14 275 lb (124.739 kg)  06/08/14 272 lb (123.378 kg)  03/14/14 271 lb (122.925 kg)    Current Medications:  Current Outpatient Prescriptions on File Prior to Visit  Medication Sig Dispense Refill  . Cyanocobalamin (VITAMIN B-12 PO) Take 3,000 mcg by mouth daily.    Marland Kitchen  escitalopram (LEXAPRO) 10 MG tablet TAKE ONE TABLET BY MOUTH DAILY 30 tablet 2  . hydrochlorothiazide (HYDRODIURIL) 25 MG tablet Take 1 tablet (25 mg total) by mouth daily. 90 tablet 1  . IRON PO Take by mouth daily.    Marland Kitchen MAGNESIUM PO Take by mouth.      . OnabotulinumtoxinA (BOTOX IM) Inject into the muscle. 15-20 every 5 months    . rosuvastatin  (CRESTOR) 10 MG tablet TAKE ONE TABLET BY MOUTH THREE TIMES A WEEK    . valACYclovir (VALTREX) 500 MG tablet TAKE 1 TABLET BY MOUTH DAILY 30 tablet 5  . Vitamin D, Ergocalciferol, (DRISDOL) 50000 UNITS CAPS capsule TAKE ONE CAPSULE BY MOUTH THREE TIMES A WEEK 12 capsule 6   No current facility-administered medications on file prior to visit.   Health Maintenance:   Immunization History  Administered Date(s) Administered  . Influenza Split 02/02/2011  . Influenza,inj,Quad PF,36+ Mos 11/21/2012, 11/10/2013  . Td 06/23/2005   Tetanus: 2007 Pneumovax: N/A Prevnar 13: N/A Flu vaccine: 2015 Zostavax: N/A Pap: 10/2013 normal, history of ASCUS 2011.  MGM: 07/2013 DEXA: N/A Colonoscopy: 2005 Normal out of state Hpylori breath test 2016 negative EGD:N/A Echo 2007 Last Eye Exam: Dr. Clydene Laming, yesterday Dentist: Dr. Jake Seats Orthodontist- got braces Dr. Ulyess Blossom  Patient Care Team: Unk Pinto, MD as PCP - General (Internal Medicine) Berenice Primas, MD as Referring Physician (Orthopedic Surgery) Lelon Perla, MD as Consulting Physician (Cardiology) Anastasio Auerbach, MD as Consulting Physician (Gynecology) Rolm Bookbinder, MD as Consulting Physician (Dermatology)   Allergies:  Allergies  Allergen Reactions  . Doxycycline Hives and Shortness Of Breath  . Adhesive [Tape]   . Amoxicillin-Pot Clavulanate Nausea Only    diarrhea  . Clindamycin/Lincomycin   . Vicodin [Hydrocodone-Acetaminophen] Itching   Medical History:  Past Medical History  Diagnosis Date  . ASCUS (atypical squamous cells of undetermined significance) on Pap smear     neg HR HPV 09/2008, ascus 06/2009,10/2009  . HSV-2 infection   . Hypercholesteremia   . Hypertension   . OSA on CPAP   . Anemia   . Allergy   . Prediabetes   . Anxiety    Surgical History:  Past Surgical History  Procedure Laterality Date  . Cesarean section    . Remove moles    . Colposcopy    . Knee surgery      Arthroscopic    Family History:  Family History  Problem Relation Age of Onset  . Hypertension Maternal Grandmother   . Breast cancer Maternal Grandmother 80  . Emphysema Maternal Grandmother   . Hypertension Mother   . Hyperlipidemia Mother    Social History:  History  Substance Use Topics  . Smoking status: Former Smoker    Quit date: 06/08/1999  . Smokeless tobacco: Never Used  . Alcohol Use: 4.2 oz/week    7 Standard drinks or equivalent per week   Review of Systems: Review of Systems  Constitutional: Negative.   HENT: Negative.   Eyes: Negative.   Respiratory: Negative.   Cardiovascular: Negative.   Gastrointestinal: Positive for heartburn. Negative for nausea, vomiting, abdominal pain, diarrhea, constipation, blood in stool and melena.  Genitourinary: Negative.   Musculoskeletal: Positive for joint pain. Negative for myalgias, back pain, falls and neck pain.  Skin: Negative.   Neurological: Negative.   Endo/Heme/Allergies: Negative for environmental allergies and polydipsia. Bruises/bleeds easily.  Psychiatric/Behavioral: Negative.     Physical Exam: Estimated body mass index is 44.41 kg/(m^2) as calculated from the  following:   Height as of this encounter: 5\' 6"  (1.676 m).   Weight as of this encounter: 275 lb (124.739 kg). BP 138/80 mmHg  Pulse 64  Temp(Src) 97.9 F (36.6 C)  Resp 16  Ht 5\' 6"  (1.676 m)  Wt 275 lb (124.739 kg)  BMI 44.41 kg/m2  LMP 05/22/2014 General Appearance: Well nourished, in no apparent distress.  Eyes: PERRLA, EOMs, conjunctiva no swelling or erythema, normal fundi and vessels.  Sinuses: No Frontal/maxillary tenderness  ENT/Mouth: Ext aud canals clear, normal light reflex with TMs without erythema, bulging. Good dentition. No erythema, swelling, or exudate on post pharynx. Tonsils not swollen or erythematous. Hearing normal.  Neck: Supple, thyroid normal. No bruits  Respiratory: Respiratory effort normal, BS equal bilaterally without rales,  rhonchi, wheezing or stridor.  Cardio: RRR without murmurs, rubs or gallops. Brisk peripheral pulses without edema.  Chest: symmetric, with normal excursions and percussion.  Breasts: defer Abdomen: Soft, nontender, no guarding, rebound, hernias, masses, or organomegaly.  Lymphatics: Non tender without lymphadenopathy.  Genitourinary: defer Musculoskeletal: Full ROM all peripheral extremities,5/5 strength, and normal gait.  Skin: defer Neuro: Cranial nerves intact, reflexes equal bilaterally. Normal muscle tone, no cerebellar symptoms. Sensation intact.  Psych: Awake and oriented X 3, normal affect, Insight and Judgment appropriate.   EKG: WNL no changes. AORTA SCAN: defer  Vicie Mutters 10:32 AM [facility]

## 2014-06-20 ENCOUNTER — Encounter: Payer: Self-pay | Admitting: Physician Assistant

## 2014-06-20 LAB — IRON AND TIBC
%SAT: 22 % (ref 20–55)
Iron: 85 ug/dL (ref 42–145)
TIBC: 392 ug/dL (ref 250–470)
UIBC: 307 ug/dL (ref 125–400)

## 2014-06-20 LAB — HEPATIC FUNCTION PANEL
ALT: 13 U/L (ref 0–35)
AST: 13 U/L (ref 0–37)
Albumin: 3.9 g/dL (ref 3.5–5.2)
Alkaline Phosphatase: 64 U/L (ref 39–117)
Bilirubin, Direct: 0.1 mg/dL (ref 0.0–0.3)
Indirect Bilirubin: 0.3 mg/dL (ref 0.2–1.2)
Total Bilirubin: 0.4 mg/dL (ref 0.2–1.2)
Total Protein: 6.6 g/dL (ref 6.0–8.3)

## 2014-06-20 LAB — VITAMIN D 25 HYDROXY (VIT D DEFICIENCY, FRACTURES): Vit D, 25-Hydroxy: 48 ng/mL (ref 30–100)

## 2014-06-20 LAB — BASIC METABOLIC PANEL WITH GFR
BUN: 9 mg/dL (ref 6–23)
CO2: 28 mEq/L (ref 19–32)
Calcium: 8.9 mg/dL (ref 8.4–10.5)
Chloride: 100 mEq/L (ref 96–112)
Creat: 0.82 mg/dL (ref 0.50–1.10)
GFR, Est African American: >89 mL/min
GFR, Est Non African American: 85 mL/min
Glucose, Bld: 86 mg/dL (ref 70–99)
Potassium: 4.2 mEq/L (ref 3.5–5.3)
Sodium: 139 mEq/L (ref 135–145)

## 2014-06-20 LAB — URINALYSIS, ROUTINE W REFLEX MICROSCOPIC
Bilirubin Urine: NEGATIVE
Glucose, UA: NEGATIVE mg/dL
Hgb urine dipstick: NEGATIVE
Ketones, ur: NEGATIVE mg/dL
Leukocytes, UA: NEGATIVE
Nitrite: NEGATIVE
Protein, ur: NEGATIVE mg/dL
Specific Gravity, Urine: 1.008 (ref 1.005–1.030)
Urobilinogen, UA: 0.2 mg/dL (ref 0.0–1.0)
pH: 5.5 (ref 5.0–8.0)

## 2014-06-20 LAB — LIPID PANEL
Cholesterol: 168 mg/dL (ref 0–200)
HDL: 46 mg/dL (ref >=46)
LDL Cholesterol: 82 mg/dL (ref 0–99)
Total CHOL/HDL Ratio: 3.7 Ratio
Triglycerides: 202 mg/dL — ABNORMAL HIGH (ref <150)
VLDL: 40 mg/dL (ref 0–40)

## 2014-06-20 LAB — FERRITIN: Ferritin: 53 ng/mL (ref 10–291)

## 2014-06-20 LAB — VITAMIN B12: Vitamin B-12: 848 pg/mL (ref 211–911)

## 2014-06-20 LAB — MAGNESIUM: Magnesium: 1.9 mg/dL (ref 1.5–2.5)

## 2014-06-20 LAB — INSULIN, FASTING: Insulin fasting, serum: 10.9 u[IU]/mL (ref 2.0–19.6)

## 2014-06-20 LAB — MICROALBUMIN / CREATININE URINE RATIO
Creatinine, Urine: 91.9 mg/dL
Microalb Creat Ratio: 3.3 mg/g (ref 0.0–30.0)
Microalb, Ur: 0.3 mg/dL (ref <2.0)

## 2014-06-20 LAB — TSH: TSH: 1.921 u[IU]/mL (ref 0.350–4.500)

## 2014-06-21 ENCOUNTER — Encounter: Payer: Self-pay | Admitting: Physician Assistant

## 2014-06-26 ENCOUNTER — Other Ambulatory Visit: Payer: Self-pay

## 2014-06-26 MED ORDER — VITAMIN D (ERGOCALCIFEROL) 1.25 MG (50000 UNIT) PO CAPS: ORAL_CAPSULE | ORAL | Status: DC

## 2014-07-09 ENCOUNTER — Other Ambulatory Visit: Payer: Self-pay

## 2014-07-09 MED ORDER — ESCITALOPRAM OXALATE 10 MG PO TABS: 10.0000 mg | ORAL_TABLET | Freq: Every day | ORAL | Status: DC

## 2014-08-06 ENCOUNTER — Ambulatory Visit: Payer: Self-pay | Admitting: Family Medicine

## 2014-08-10 ENCOUNTER — Encounter: Payer: Self-pay | Admitting: Family Medicine

## 2014-08-10 ENCOUNTER — Ambulatory Visit (INDEPENDENT_AMBULATORY_CARE_PROVIDER_SITE_OTHER): Payer: BLUE CROSS/BLUE SHIELD | Admitting: Family Medicine

## 2014-08-10 ENCOUNTER — Other Ambulatory Visit (INDEPENDENT_AMBULATORY_CARE_PROVIDER_SITE_OTHER): Payer: BLUE CROSS/BLUE SHIELD

## 2014-08-10 VITALS — BP 122/84 | HR 64 | Ht 66.0 in | Wt 267.0 lb

## 2014-08-10 DIAGNOSIS — S86899A Other injury of other muscle(s) and tendon(s) at lower leg level, unspecified leg, initial encounter: Secondary | ICD-10-CM

## 2014-08-10 DIAGNOSIS — M79605 Pain in left leg: Secondary | ICD-10-CM

## 2014-08-10 DIAGNOSIS — M79604 Pain in right leg: Secondary | ICD-10-CM

## 2014-08-10 NOTE — Progress Notes (Signed)
  [facility] [address] [place], [place] [address] Phone: [phone] Subjective:        CC: Bilateral leg pain  [id number] Wendy Levine is a 49 y.o. female coming in with bilateral leg pain. Patient has been seen previously for bilateral knee pains. Patient states unfortunately go starting her ankle and going up to started having increasing pain. Patient has been doing a lot more on her feet recently and unfortunately has pain. Patient discusses it as a dull throbbing aching sensation. States that he can be a sharp pain touched in the area. Seems to be worse after activity and not as much during activity. Patient states that this is accompanied with swelling of the ankles bilaterally. Has been wearing the supportive shoes as well as sometimes the custom orthotics. She is going out of the country for the next 2 weeks seen officially think she'll do to help with this discomfort. Rates the severity of pain a 5 out of 10. Denies any numbness associated with it.    Past medical history, social, surgical and family history all reviewed in electronic medical record.   Review of Systems: No headache, visual changes, nausea, vomiting, diarrhea, constipation, dizziness, abdominal pain, skin rash, fevers, chills, night sweats, weight loss, swollen lymph nodes, body aches, joint swelling, muscle aches, chest pain, shortness of breath, mood changes.   Objective Blood pressure 122/84, pulse 64, height 5\' 6"  (1.676 m), weight 267 lb (121.11 kg), SpO2 98 %.  General: No apparent distress alert and oriented x3 mood and affect normal, dressed appropriately.  HEENT: Pupils equal, extraocular movements intact  Respiratory: Patient's speak in full sentences and does not appear short of breath  Cardiovascular: No lower extremity edema, non tender, no erythema  Skin: Warm dry intact with no signs of infection or rash on extremities or on axial skeleton.  Abdomen: Soft  nontender  Neuro: Cranial nerves II through XII are intact, neurovascularly intact in all extremities with 2+ DTRs and 2+ pulses.  Lymph: No lymphadenopathy of posterior or anterior cervical chain or axillae bilaterally.  Gait normal with good balance and coordination.  MSK:  Non tender with full range of motion and good stability and symmetric strength and tone of shoulders, elbows, wrist, hip, and ankles bilaterally.   Knee: Bilateral Normal to inspection with no erythema or effusion or obvious bony abnormalities. Mild medial joint line tenderness bilaterally Patient though is severely tender to palpation over the distal medial aspect of the tibia bilaterally especially to percussion. ROM full in flexion and extension and lower leg rotation. Ligaments with solid consistent endpoints including ACL, PCL, LCL, MCL. Negative Mcmurray's, Apley's, and Thessalonian tests. Non painful patellar compression. Patellar glide with moderate crepitus. Patellar and quadriceps tendons unremarkable. Hamstring and quadriceps strength is normal.  MSK US performed of: Lateral tibia This study was ordered, performed, and interpreted by Charlann Boxer D.O.  Knee: Focusing on patient's distal tibia patient does have some hypoechoic changes over the bone itself. Mild increased upper flow generalized of the distal tibia approximate 2 cm proximal from the medial malleolus. No significant cortical defect noted. Seems to be relatively similar to the other side.   IMPRESSION:  Stress reaction of bilateral tibia.       Impression and Recommendations:     This case required medical decision making of moderate complexity.

## 2014-08-10 NOTE — Progress Notes (Signed)
Pre visit review using our clinic review tool, if applicable. No additional management support is needed unless otherwise documented below in the visit note. 

## 2014-08-10 NOTE — Patient Instructions (Signed)
Good to see you Compression sleeves from dicks or omega sports pennsaid pinkie amount topically 2 times daily as needed.  Ice 20 minutes 2 times daily. Usually after activity and before bed. Above the ankle See me again after [facility]

## 2014-08-10 NOTE — Assessment & Plan Note (Signed)
Discussed with patient at this time. 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 I think it secondary to her feet and port shoe choices. Patient is also gained weight since last exam. We discussed the importance of changes patient can make. We discussed proper shoewear, compression, topical anti-inflammatory because she was told by primary care not to do oral anti-inflammatory. Patient is taking high-dose vitamin D supplementation and still having a stress reaction. We may went to consider a bone density scan in the future. In addition of this we discussed the possibility of Fosamax which patient declined. Patient will do more of an icing regimen and given some home exercises. Patient will come back after her trip and we will further evaluate.  Spent  25 minutes with patient face-to-face and had greater than 50% of counseling including as described above in assessment and plan.

## 2014-08-13 ENCOUNTER — Other Ambulatory Visit: Payer: Self-pay | Admitting: Physician Assistant

## 2014-08-13 MED ORDER — FLUCONAZOLE 150 MG PO TABS: 150.0000 mg | ORAL_TABLET | Freq: Every day | ORAL | Status: DC

## 2014-09-03 ENCOUNTER — Ambulatory Visit (INDEPENDENT_AMBULATORY_CARE_PROVIDER_SITE_OTHER)
Admission: RE | Admit: 2014-09-03 | Discharge: 2014-09-03 | Disposition: A | Discharge status: Home / Self Care | Payer: BLUE CROSS/BLUE SHIELD | Source: Ambulatory Visit | Attending: Family Medicine | Admitting: Family Medicine

## 2014-09-03 ENCOUNTER — Ambulatory Visit (INDEPENDENT_AMBULATORY_CARE_PROVIDER_SITE_OTHER): Payer: BLUE CROSS/BLUE SHIELD | Admitting: Family Medicine

## 2014-09-03 ENCOUNTER — Encounter: Payer: Self-pay | Admitting: Family Medicine

## 2014-09-03 VITALS — BP 126/82 | HR 60 | Ht 66.0 in | Wt 270.0 lb

## 2014-09-03 DIAGNOSIS — E559 Vitamin D deficiency, unspecified: Secondary | ICD-10-CM

## 2014-09-03 DIAGNOSIS — S86899D Other injury of other muscle(s) and tendon(s) at lower leg level, unspecified leg, subsequent encounter: Secondary | ICD-10-CM

## 2014-09-03 MED ORDER — DICLOFENAC SODIUM 2 % TD SOLN: TRANSDERMAL | Status: DC

## 2014-09-03 NOTE — Patient Instructions (Addendum)
Great to see you and welcome back Pennsaid will be sent to your house Get bone density test Continue the ice and high dose vitamin D HCTZ discontinue for 1 week and watch lood pressure If elevate then take 3 times a week.  See me again in 3-4 weeks.

## 2014-09-03 NOTE — Assessment & Plan Note (Signed)
Asian overall is seems to be healing somewhat. We are going to continue with conservative therapy at this time. We discussed using her custom orthotics that she had made previously on a more regular basis. We discussed continue to avoid any type of jumping or any significant increase in high impact exercises at this time. Patient with continue to wear good shoes. Patient is being scheduled for a bone density exam secondary to this being another stress reaction. Patient will come back and see me again in 3-4 weeks to make sure she continues to improve.  Spent  25 minutes with patient face-to-face and had greater than 50% of counseling including as described above in assessment and plan.

## 2014-09-03 NOTE — Progress Notes (Signed)
Pre visit review using our clinic review tool, if applicable. No additional management support is needed unless otherwise documented below in the visit note. 

## 2014-09-03 NOTE — Progress Notes (Signed)
  [name] [address] [place], [place] [address] Phone: [phone] Subjective:        CC: Bilateral leg pain follow up  [id number]:[id number] Wendy Levine is a 49 y.o. female coming in with bilateral leg pain. Patient was seen previously and did have more of a tibial stress reaction bilaterally. Patient was put on high-dose vitamin D and was going to wear proper shoes. Patient does have flat feet which is also contributing to the difficulty as well as patient being overweight. Patient's was to do compression, icing, and home exercises. Patient states  she is doing significantly better. Patient states that the initial aspect of it though she was having some difficulty when she was walking a lot. Patient was wearing the compression socks as well as doing the icing intermittently. Patient was wearing her tennis shoes on a more regular basis which was helpful. Patient has come back here and not been as diligent with her activities but the pain has not worsened. Patient is continuing with the vitamin D 50,000 multiple dosing and still having difficult to get into a normal range. Patient is not been scheduled for a bone density test at this time.    Past medical history, social, surgical and family history all reviewed in electronic medical record.   Review of Systems: No headache, visual changes, nausea, vomiting, diarrhea, constipation, dizziness, abdominal pain, skin rash, fevers, chills, night sweats, weight loss, swollen lymph nodes, body aches, joint swelling, muscle aches, chest pain, shortness of breath, mood changes.   Objective Blood pressure 126/82, pulse 60, height 5\' 6"  (1.676 m), weight 270 lb (122.471 kg), SpO2 96 %.  General: No apparent distress alert and oriented x3 mood and affect normal, dressed appropriately.  HEENT: Pupils equal, extraocular movements intact  Respiratory: Patient's speak in full sentences and does not appear short of breath    Cardiovascular: No lower extremity edema, non tender, no erythema  Skin: Warm dry intact with no signs of infection or rash on extremities or on axial skeleton.  Abdomen: Soft nontender  Neuro: Cranial nerves II through XII are intact, neurovascularly intact in all extremities with 2+ DTRs and 2+ pulses.  Lymph: No lymphadenopathy of posterior or anterior cervical chain or axillae bilaterally.  Gait normal with good balance and coordination.  MSK:  Non tender with full range of motion and good stability and symmetric strength and tone of shoulders, elbows, wrist, hip, and ankles bilaterally.   Knee: Bilateral Normal to inspection with no erythema or effusion or obvious bony abnormalities. Mild medial joint line tenderness bilaterally Moderate tenderness still remains of the proximal distal aspect of the medial tibial bilaterally right greater than left no pain to percussion which is an improvement ROM full in flexion and extension and lower leg rotation. Ligaments with solid consistent endpoints including ACL, PCL, LCL, MCL. Negative Mcmurray's, Apley's, and Thessalonian tests. Non painful patellar compression. Patellar glide with moderate crepitus. Patellar and quadriceps tendons unremarkable. Hamstring and quadriceps strength is normal.  MSK US performed of: Lateral tibia This study was ordered, performed, and interpreted by Charlann Boxer D.O.  Knee: Focusing on patient's distal tibia patient does have significant decrease in hypoechoic changes. Reabsorption of the bone noted. Mild increase in Doppler flow noted.   IMPRESSION:  Stress reaction of bilateral tibia. With interval healing.       Impression and Recommendations:     This case required medical decision making of moderate complexity.

## 2014-09-06 ENCOUNTER — Encounter: Payer: Self-pay | Admitting: Family Medicine

## 2014-09-12 ENCOUNTER — Encounter: Payer: Self-pay | Admitting: Family Medicine

## 2014-09-17 ENCOUNTER — Other Ambulatory Visit: Payer: Self-pay | Admitting: Gynecology

## 2014-09-17 DIAGNOSIS — Z1231 Encounter for screening mammogram for malignant neoplasm of breast: Secondary | ICD-10-CM

## 2014-09-19 ENCOUNTER — Ambulatory Visit (INDEPENDENT_AMBULATORY_CARE_PROVIDER_SITE_OTHER): Payer: BLUE CROSS/BLUE SHIELD | Admitting: Physician Assistant

## 2014-09-19 ENCOUNTER — Encounter: Payer: Self-pay | Admitting: Physician Assistant

## 2014-09-19 VITALS — BP 152/90 | HR 84 | Temp 98.4°F | Resp 18 | Ht 66.0 in | Wt 266.0 lb

## 2014-09-19 DIAGNOSIS — E7800 Pure hypercholesterolemia, unspecified: Secondary | ICD-10-CM

## 2014-09-19 DIAGNOSIS — E8881 Metabolic syndrome: Secondary | ICD-10-CM

## 2014-09-19 DIAGNOSIS — E559 Vitamin D deficiency, unspecified: Secondary | ICD-10-CM

## 2014-09-19 DIAGNOSIS — Z79899 Other long term (current) drug therapy: Secondary | ICD-10-CM

## 2014-09-19 DIAGNOSIS — I10 Essential (primary) hypertension: Secondary | ICD-10-CM

## 2014-09-19 DIAGNOSIS — R5383 Other fatigue: Secondary | ICD-10-CM

## 2014-09-19 DIAGNOSIS — M25551 Pain in right hip: Secondary | ICD-10-CM

## 2014-09-19 DIAGNOSIS — G4733 Obstructive sleep apnea (adult) (pediatric): Secondary | ICD-10-CM

## 2014-09-19 DIAGNOSIS — M25552 Pain in left hip: Secondary | ICD-10-CM

## 2014-09-19 DIAGNOSIS — E669 Obesity, unspecified: Secondary | ICD-10-CM

## 2014-09-19 DIAGNOSIS — D649 Anemia, unspecified: Secondary | ICD-10-CM

## 2014-09-19 LAB — CBC WITH DIFFERENTIAL/PLATELET
Basophils Absolute: 0.1 10*3/uL (ref 0.0–0.1)
Basophils Relative: 1 % (ref 0–1)
Eosinophils Absolute: 0.1 10*3/uL (ref 0.0–0.7)
Eosinophils Relative: 1 % (ref 0–5)
HCT: 39.7 % (ref 36.0–46.0)
Hemoglobin: 13.3 g/dL (ref 12.0–15.0)
Lymphocytes Relative: 17 % (ref 12–46)
Lymphs Abs: 1.3 10*3/uL (ref 0.7–4.0)
MCH: 28.5 pg (ref 26.0–34.0)
MCHC: 33.5 g/dL (ref 30.0–36.0)
MCV: 85.2 fL (ref 78.0–100.0)
MPV: 10.4 fL (ref 8.6–12.4)
Monocytes Absolute: 0.4 10*3/uL (ref 0.1–1.0)
Monocytes Relative: 5 % (ref 3–12)
Neutro Abs: 5.7 10*3/uL (ref 1.7–7.7)
Neutrophils Relative %: 76 % (ref 43–77)
Platelets: 266 10*3/uL (ref 150–400)
RBC: 4.66 MIL/uL (ref 3.87–5.11)
RDW: 14.6 % (ref 11.5–15.5)
WBC: 7.5 10*3/uL (ref 4.0–10.5)

## 2014-09-19 LAB — HEMOGLOBIN A1C
Hgb A1c MFr Bld: 5.6 % (ref <5.7)
Mean Plasma Glucose: 114 mg/dL (ref <117)

## 2014-09-19 MED ORDER — ENALAPRIL MALEATE 20 MG PO TABS: 20.0000 mg | ORAL_TABLET | Freq: Every day | ORAL | Status: DC

## 2014-09-19 NOTE — Progress Notes (Signed)
Assessment and Plan:  1. Hypertension -take HCTZ PRN for swelling, enalapril 20mg  1/2 pill, monitor blood pressure at home. Continue DASH diet.  Reminder to go to the ER if any CP, SOB, nausea, dizziness, severe HA, changes vision/speech, left arm numbness and tingling and jaw pain.  2. Cholesterol -Continue diet and exercise. Check cholesterol.   3. Prediabetes  -Continue diet and exercise. Check A1C  4. Vitamin D Def - check level and continue medications.   5. Obesity with co morbidities - long discussion about weight loss, diet, and exercise  6. Fatigue  Discussed lifestyle modification as means of resolving problem, Training modifications discussed, See orders for lab evaluation, Discussed how depression can be a cause of fatigue if all labs are negative will discuss depression treatment.   Continue diet and meds as discussed. Further disposition pending results of labs. Over 30 minutes of exam, counseling, chart review, and critical decision making was performed  HPI 49 y.o. female  presents for 3 month follow up on hypertension, cholesterol, prediabetes, obesity, and vitamin D deficiency.   Her blood pressure has been controlled at home, she has been off the HCTZ due to advise from [place] , today their BP is BP: (!) 152/90 mmHg, will recheck kidney function today since off HCTZ.   She does not workout. She denies chest pain, shortness of breath, dizziness.  She is on cholesterol medication, crestor 10mg  and denies myalgias. Her cholesterol is at goal. The cholesterol last visit was:   Lab Results  Component Value Date   CHOL 168 06/19/2014   HDL 46 06/19/2014   LDLCALC 82 06/19/2014   TRIG 202* 06/19/2014   CHOLHDL 3.7 06/19/2014    She has been working on diet and exercise for prediabetes with insulin resistance due to obesity, and denies paresthesia of the feet, polydipsia, polyuria and visual disturbances. Last A1C in the office was:  Lab Results  Component Value Date    HGBA1C 5.7* 06/19/2014   Patient is on Vitamin D supplement.   Lab Results  Component Value Date   VD25OH 48 06/19/2014     She has extreme fatigue, and no matter how much B12 or iron she takes it is worse. Tired in the AM when she first gets up, occ does not sleep well, she is on CPAP and states still not helping. She is very concerned about her heart health, she has a family history, denies SOB, dizziness, palpitations.  BMI is Body mass index is 42.95 kg/(m^2)., she is working on diet and exercise. Wt Readings from Last 3 Encounters:  09/19/14 266 lb (120.657 kg)  09/03/14 270 lb (122.471 kg)  08/10/14 267 lb (121.11 kg)     Current Medications:  Current Outpatient Prescriptions on File Prior to Visit  Medication Sig Dispense Refill  . ALPRAZolam (XANAX) 0.5 MG tablet Take 1 tablet (0.5 mg total) by mouth 2 (two) times daily as needed for anxiety. AS NEEDED WHEN FLYING 60 tablet 0  . Cyanocobalamin (VITAMIN B-12 PO) Take 1,000 mcg by mouth daily.     . Diclofenac Sodium 2 % SOLN Apply 1 pump twice daily. 112 g 3  . escitalopram (LEXAPRO) 10 MG tablet Take 1 tablet (10 mg total) by mouth daily. 30 tablet 3  . IRON PO Take by mouth daily.    Marland Kitchen MAGNESIUM PO Take by mouth.      . OnabotulinumtoxinA (BOTOX IM) Inject into the muscle. 15-20 every 5 months    . rosuvastatin (CRESTOR) 10 MG  tablet TAKE ONE TABLET BY MOUTH THREE TIMES A WEEK    . valACYclovir (VALTREX) 500 MG tablet TAKE 1 TABLET BY MOUTH DAILY 30 tablet 5  . Vitamin D, Ergocalciferol, (DRISDOL) 50000 UNITS CAPS capsule TAKE ONE CAPSULE BY MOUTH THREE TIMES A WEEK 12 capsule PRN   No current facility-administered medications on file prior to visit.   Medical History:  Past Medical History  Diagnosis Date  . ASCUS (atypical squamous cells of undetermined significance) on Pap smear     neg HR HPV 09/2008, ascus 06/2009,10/2009  . HSV-2 infection   . Hypercholesteremia   . Hypertension   . OSA on CPAP   . Anemia   .  Allergy   . Prediabetes   . Anxiety    Allergies:  Allergies  Allergen Reactions  . Doxycycline Hives and Shortness Of Breath  . Adhesive [Tape]   . Amoxicillin-Pot Clavulanate Nausea Only    diarrhea  . Clindamycin/Lincomycin   . Vicodin [Hydrocodone-Acetaminophen] Itching    Review of Systems:  Review of Systems  Constitutional: Positive for malaise/fatigue. Negative for fever, chills, weight loss and diaphoresis.  HENT: Negative.   Eyes: Negative.   Respiratory: Negative.  Negative for cough and shortness of breath.   Cardiovascular: Negative.  Negative for chest pain, claudication, leg swelling and PND.  Gastrointestinal: Negative.   Genitourinary: Negative.   Musculoskeletal: Positive for myalgias and joint pain. Negative for back pain, falls and neck pain.  Skin: Negative.   Neurological: Negative.  Negative for weakness.  Psychiatric/Behavioral: Negative.     Family history- Review and unchanged Social history- Review and unchanged Physical Exam: BP 152/90 mmHg  Pulse 84  Temp(Src) 98.4 F (36.9 C) (Temporal)  Resp 18  Ht 5\' 6"  (1.676 m)  Wt 266 lb (120.657 kg)  BMI 42.95 kg/m2  LMP 09/03/2014 Wt Readings from Last 3 Encounters:  09/19/14 266 lb (120.657 kg)  09/03/14 270 lb (122.471 kg)  08/10/14 267 lb (121.11 kg)   General Appearance: Well nourished, in no apparent distress. Eyes: PERRLA, EOMs, conjunctiva no swelling or erythema Sinuses: No Frontal/maxillary tenderness ENT/Mouth: Ext aud canals clear, TMs without erythema, bulging. No erythema, swelling, or exudate on post pharynx.  Tonsils not swollen or erythematous. Hearing normal.  Neck: Supple, thyroid normal.  Respiratory: Respiratory effort normal, BS equal bilaterally without rales, rhonchi, wheezing or stridor.  Cardio: RRR with no MRGs. Brisk peripheral pulses without edema.  Abdomen: Soft, + BS,  Non tender, no guarding, rebound, hernias, masses. Lymphatics: Non tender without  lymphadenopathy.  Musculoskeletal: Full ROM, 5/5 strength, Normal gait Skin: Warm, dry without rashes, lesions, ecchymosis.  Neuro: Cranial nerves intact. Normal muscle tone, no cerebellar symptoms. Psych: Awake and oriented X 3, normal affect, Insight and Judgment appropriate.    Vicie Mutters, PA-C 10:39 AM [facility]

## 2014-09-19 NOTE — Patient Instructions (Signed)
ACE inhibitors are blood pressure medications that protect your heart and kidneys. It can cause two symptoms: The most common symptom is a dry cough/tickle in your throat that can happen the first day you take it or 5 years after you have been taking it. Please call us if you have this and we can switch it to a different medications. The least common side effect is called angioedema which is swelling of your lips and tongue and can cause problems with your breathing. This is a very very rare side effect but very serious. If this happens please stop the medication and go to the ER.   We are starting you on Metformin to prevent or treat diabetes. Metformin does not cause low blood sugars. In order to create energy your cells need insulin and sugar but sometime your cells do not accept the insulin and this can cause increased sugars and decreased energy. The Metformin helps your cells accept insulin and the sugar to give you more energy.   The two most common side effects are nausea and diarrhea, follow these rules to avoid it! You can take imodium per box instructions when starting metformin if needed.   Rules of metformin: 1) start out slow with only one pill daily. Our goal for you is 4 pills a day or 2000mg  total.  2) take with your largest meal. 3) Take with least amount of carbs.   Call if you have any problems.   Fatigue Fatigue is a feeling of tiredness, lack of energy, lack of motivation, or feeling tired all the time. Having enough rest, good nutrition, and reducing stress will normally reduce fatigue. Consult your caregiver if it persists. The nature of your fatigue will help your caregiver to find out its cause. The treatment is based on the cause.  CAUSES  There are many causes for fatigue. Most of the time, fatigue can be traced to one or more of your habits or routines. Most causes fit into one or more of three general areas. They are: Lifestyle problems  Sleep  disturbances.  Overwork.  Physical exertion.  Unhealthy habits.  Poor eating habits or eating disorders.  Alcohol and/or drug use .  Lack of proper nutrition (malnutrition). Psychological problems  Stress and/or anxiety problems.  Depression.  Grief.  Boredom. Medical Problems or Conditions  Anemia.  Pregnancy.  Thyroid gland problems.  Recovery from major surgery.  Continuous pain.  Emphysema or asthma that is not well controlled  Allergic conditions.  Diabetes.  Infections (such as mononucleosis).  Obesity.  Sleep disorders, such as sleep apnea.  Heart failure or other heart-related problems.  Cancer.  Kidney disease.  Liver disease.  Effects of certain medicines such as antihistamines, cough and cold remedies, prescription pain medicines, heart and blood pressure medicines, drugs used for treatment of cancer, and some antidepressants. SYMPTOMS  The symptoms of fatigue include:   Lack of energy.  Lack of drive (motivation).  Drowsiness.  Feeling of indifference to the surroundings. DIAGNOSIS  The details of how you feel help guide your caregiver in finding out what is causing the fatigue. You will be asked about your present and past health condition. It is important to review all medicines that you take, including prescription and non-prescription items. A thorough exam will be done. You will be questioned about your feelings, habits, and normal lifestyle. Your caregiver may suggest blood tests, urine tests, or other tests to look for common medical causes of fatigue.  TREATMENT  Fatigue is  treated by correcting the underlying cause. For example, if you have continuous pain or depression, treating these causes will improve how you feel. Similarly, adjusting the dose of certain medicines will help in reducing fatigue.  HOME CARE INSTRUCTIONS   Try to get the required amount of good sleep every night.  Eat a healthy and nutritious diet, and  drink enough water throughout the day.  Practice ways of relaxing (including yoga or meditation).  Exercise regularly.  Make plans to change situations that cause stress. Act on those plans so that stresses decrease over time. Keep your work and personal routine reasonable.  Avoid street drugs and minimize use of alcohol.  Start taking a daily multivitamin after consulting your caregiver. SEEK MEDICAL CARE IF:   You have persistent tiredness, which cannot be accounted for.  You have fever.  You have unintentional weight loss.  You have headaches.  You have disturbed sleep throughout the night.  You are feeling sad.  You have constipation.  You have dry skin.  You have gained weight.  You are taking any new or different medicines that you suspect are causing fatigue.  You are unable to sleep at night.  You develop any unusual swelling of your legs or other parts of your body. SEEK IMMEDIATE MEDICAL CARE IF:   You are feeling confused.  Your vision is blurred.  You feel faint or pass out.  You develop severe headache.  You develop severe abdominal, pelvic, or back pain.  You develop chest pain, shortness of breath, or an irregular or fast heartbeat.  You are unable to pass a normal amount of urine.  You develop abnormal bleeding such as bleeding from the rectum or you vomit blood.  You have thoughts about harming yourself or committing suicide.  You are worried that you might harm someone else. MAKE SURE YOU:   Understand these instructions.  Will watch your condition.  Will get help right away if you are not doing well or get worse. Document Released: 12/07/2006 Document Revised: 05/04/2011 Document Reviewed: 06/13/2013 [facility] Patient Information 2015 [place], [place]. This information is not intended to replace advice given to you by your health care provider. Make sure you discuss any questions you have with your health care provider.

## 2014-09-20 LAB — HEPATIC FUNCTION PANEL
ALT: 16 U/L (ref 6–29)
AST: 15 U/L (ref 10–35)
Albumin: 4.3 g/dL (ref 3.6–5.1)
Alkaline Phosphatase: 66 U/L (ref 33–115)
Bilirubin, Direct: 0.1 mg/dL (ref <=0.2)
Indirect Bilirubin: 0.5 mg/dL (ref 0.2–1.2)
Total Bilirubin: 0.6 mg/dL (ref 0.2–1.2)
Total Protein: 6.9 g/dL (ref 6.1–8.1)

## 2014-09-20 LAB — IRON AND TIBC
%SAT: 21 % (ref 20–55)
Iron: 93 ug/dL (ref 42–145)
TIBC: 450 ug/dL (ref 250–470)
UIBC: 357 ug/dL (ref 125–400)

## 2014-09-20 LAB — INSULIN, FASTING: Insulin fasting, serum: 13.5 u[IU]/mL (ref 2.0–19.6)

## 2014-09-20 LAB — BASIC METABOLIC PANEL WITH GFR
BUN: 11 mg/dL (ref 7–25)
CO2: 26 mEq/L (ref 20–31)
Calcium: 9 mg/dL (ref 8.6–10.2)
Chloride: 102 mEq/L (ref 98–110)
Creat: 0.75 mg/dL (ref 0.50–1.10)
GFR, Est African American: >89 mL/min (ref >=60)
GFR, Est Non African American: >89 mL/min (ref >=60)
Glucose, Bld: 92 mg/dL (ref 65–99)
Potassium: 4.8 mEq/L (ref 3.5–5.3)
Sodium: 139 mEq/L (ref 135–146)

## 2014-09-20 LAB — MAGNESIUM: Magnesium: 2 mg/dL (ref 1.5–2.5)

## 2014-09-20 LAB — LIPID PANEL
Cholesterol: 188 mg/dL (ref 125–200)
HDL: 42 mg/dL — ABNORMAL LOW (ref >=46)
LDL Cholesterol: 105 mg/dL (ref <130)
Total CHOL/HDL Ratio: 4.5 Ratio (ref <=5.0)
Triglycerides: 207 mg/dL — ABNORMAL HIGH (ref <150)
VLDL: 41 mg/dL — ABNORMAL HIGH (ref <30)

## 2014-09-20 LAB — SEDIMENTATION RATE: Sed Rate: 6 mm/hr (ref 0–20)

## 2014-09-20 LAB — VITAMIN B12: Vitamin B-12: 1027 pg/mL — ABNORMAL HIGH (ref 211–911)

## 2014-09-20 LAB — CK: Total CK: 56 U/L (ref 7–177)

## 2014-09-20 LAB — FERRITIN: Ferritin: 47 ng/mL (ref 10–291)

## 2014-09-20 LAB — TSH: TSH: 2.208 u[IU]/mL (ref 0.350–4.500)

## 2014-09-20 LAB — VITAMIN D 25 HYDROXY (VIT D DEFICIENCY, FRACTURES): Vit D, 25-Hydroxy: 58 ng/mL (ref 30–100)

## 2014-09-27 NOTE — Addendum Note (Signed)
Encounter addended by: Abner Greenspan, MD on: 09/27/2014  2:30 PM<BR>     Documentation filed: Clinical Notes

## 2014-09-27 NOTE — Procedures (Signed)
Addendum for bone density reading == the Z scores were misread as T scores (pt is pre menopausal) Still in the normal range and re assuring  PCP is aware

## 2014-09-28 ENCOUNTER — Ambulatory Visit (HOSPITAL_COMMUNITY)
Admission: RE | Admit: 2014-09-28 | Discharge: 2014-09-28 | Disposition: A | Discharge status: Home / Self Care | Payer: BLUE CROSS/BLUE SHIELD | Source: Ambulatory Visit | Attending: Gynecology | Admitting: Gynecology

## 2014-09-28 ENCOUNTER — Other Ambulatory Visit: Payer: Self-pay | Admitting: Internal Medicine

## 2014-09-28 DIAGNOSIS — Z1231 Encounter for screening mammogram for malignant neoplasm of breast: Secondary | ICD-10-CM | POA: Insufficient documentation

## 2014-10-01 ENCOUNTER — Encounter: Payer: Self-pay | Admitting: Physician Assistant

## 2014-10-01 ENCOUNTER — Other Ambulatory Visit: Payer: Self-pay | Admitting: Physician Assistant

## 2014-10-01 ENCOUNTER — Ambulatory Visit: Payer: BLUE CROSS/BLUE SHIELD | Admitting: Family Medicine

## 2014-10-01 MED ORDER — VALSARTAN 160 MG PO TABS: ORAL_TABLET | ORAL | Status: DC

## 2014-11-05 ENCOUNTER — Encounter: Payer: Self-pay | Admitting: Physician Assistant

## 2014-11-14 ENCOUNTER — Ambulatory Visit (INDEPENDENT_AMBULATORY_CARE_PROVIDER_SITE_OTHER): Payer: BLUE CROSS/BLUE SHIELD | Admitting: Gynecology

## 2014-11-14 ENCOUNTER — Other Ambulatory Visit (HOSPITAL_COMMUNITY)
Admission: RE | Admit: 2014-11-14 | Discharge: 2014-11-14 | Disposition: A | Discharge status: Home / Self Care | Payer: BLUE CROSS/BLUE SHIELD | Source: Ambulatory Visit | Attending: Gynecology | Admitting: Gynecology

## 2014-11-14 ENCOUNTER — Encounter: Payer: Self-pay | Admitting: Gynecology

## 2014-11-14 VITALS — BP 120/76 | Ht 65.0 in | Wt 267.0 lb

## 2014-11-14 DIAGNOSIS — Z01419 Encounter for gynecological examination (general) (routine) without abnormal findings: Secondary | ICD-10-CM | POA: Diagnosis not present

## 2014-11-14 DIAGNOSIS — Z23 Encounter for immunization: Secondary | ICD-10-CM

## 2014-11-14 DIAGNOSIS — Z01411 Encounter for gynecological examination (general) (routine) with abnormal findings: Secondary | ICD-10-CM | POA: Insufficient documentation

## 2014-11-14 NOTE — Addendum Note (Signed)
Addended by: Nelva Nay on: 11/14/2014 11:36 AM   Modules accepted: Orders

## 2014-11-14 NOTE — Progress Notes (Signed)
Wendy Levine 21-May-1965 [id number]        49 y.o.  G1P1001 for annual exam.  Doing well without complaints.  Past medical history,surgical history, problem list, medications, allergies, family history and social history were all reviewed and documented as reviewed in the EPIC chart.  ROS:  Performed with pertinent positives and negatives included in the history, assessment and plan.   Additional significant findings :  none   Exam: Kim Counsellor Vitals:   11/14/14 1052  BP: 120/76  Height: 5\' 5"  (1.651 m)  Weight: 267 lb (121.11 kg)   General appearance:  Normal affect, orientation and appearance. Skin: Grossly normal HEENT: Without gross lesions.  No cervical or supraclavicular adenopathy. Thyroid normal.  Lungs:  Clear without wheezing, rales or rhonchi Cardiac: RR, without RMG Abdominal:  Soft, nontender, without masses, guarding, rebound, organomegaly or hernia Breasts:  Examined lying and sitting without masses, retractions, discharge or axillary adenopathy. Pelvic:  Ext/BUS/vagina normal  Cervix normal. Pap smear done, patient's at the tail end of her menses  Uterus difficult to palpate but grossly normal size midline mobile nontender   Adnexa  Without gross masses or tenderness    Anus and perineum  Normal   Rectovaginal  Normal sphincter tone without palpated masses or tenderness.    Assessment/Plan:  49 y.o. G65P1001 female for annual exam with regular menses, barrier contraception.   1. Contraception. Patient continues to use rhythm/condoms. We discussed this multiple times and she is comfortable with this accepting the increased failure risk. 2. History of occasional HSV outbreaks. Use Valtrex several days during outbreaks. She has a supply at home but will call when she needs more. 3. Pap smear/HPV negative 2015. Pap smear done today. History of ASCUS 2010 through 2011. No endocervical cells on Pap smear last year and that is why repeated it this year.  Plan less frequent screening interval assuming this Pap smear is normal. 4. History of vulvitis with periclitoral irritation. Was treated with Temovate and Diflucan which appeared to break the cycle and she's no longer having issues with this and her exam is normal. Follow up if symptoms recur. 5. Mammography 09/2014. Continue with annual mammography. SBE monthly reviewed. 6. DEXA 2016 secondary to what sounds like a stress fracture historically. Study was normal. Plan repeat into the menopause. Increased calcium vitamin D. 7. Health maintenance. No routine blood work done as patient reports this done at her primary physician's office. Follow up 1 year, sooner as needed   Anastasio Auerbach MD, 11:23 AM 11/14/2014

## 2014-11-14 NOTE — Patient Instructions (Signed)

## 2014-11-15 LAB — URINALYSIS W MICROSCOPIC + REFLEX CULTURE
Bacteria, UA: NONE SEEN [HPF]
Bilirubin Urine: NEGATIVE
Casts: NONE SEEN [LPF]
Crystals: NONE SEEN [HPF]
Glucose, UA: NEGATIVE
Hgb urine dipstick: NEGATIVE
Ketones, ur: NEGATIVE
Leukocytes, UA: NEGATIVE
Nitrite: NEGATIVE
Protein, ur: NEGATIVE
RBC / HPF: NONE SEEN RBC/HPF (ref <=2)
Specific Gravity, Urine: 1.023 (ref 1.001–1.035)
Squamous Epithelial / LPF: NONE SEEN [HPF] (ref <=5)
WBC, UA: NONE SEEN WBC/HPF (ref <=5)
Yeast: NONE SEEN [HPF]
pH: 5.5 (ref 5.0–8.0)

## 2014-11-16 LAB — CYTOLOGY - PAP

## 2014-11-19 ENCOUNTER — Other Ambulatory Visit: Payer: Self-pay | Admitting: Physician Assistant

## 2014-11-26 ENCOUNTER — Other Ambulatory Visit: Payer: Self-pay | Admitting: Gynecology

## 2014-11-26 DIAGNOSIS — R87619 Unspecified abnormal cytological findings in specimens from cervix uteri: Secondary | ICD-10-CM

## 2014-12-03 ENCOUNTER — Other Ambulatory Visit: Payer: Self-pay | Admitting: Gynecology

## 2014-12-03 DIAGNOSIS — R87629 Unspecified abnormal cytological findings in specimens from vagina: Secondary | ICD-10-CM

## 2014-12-10 ENCOUNTER — Telehealth: Payer: Self-pay | Admitting: Gynecology

## 2014-12-10 NOTE — Telephone Encounter (Signed)
12/10/14-I LM VM for pt that her [facility] insurance states that since she has met her $4000 deductible, they will cover her sonohysterogram at 100%.wl

## 2014-12-19 ENCOUNTER — Ambulatory Visit (INDEPENDENT_AMBULATORY_CARE_PROVIDER_SITE_OTHER): Payer: BLUE CROSS/BLUE SHIELD

## 2014-12-19 ENCOUNTER — Ambulatory Visit (INDEPENDENT_AMBULATORY_CARE_PROVIDER_SITE_OTHER): Payer: BLUE CROSS/BLUE SHIELD | Admitting: Gynecology

## 2014-12-19 ENCOUNTER — Encounter: Payer: Self-pay | Admitting: Gynecology

## 2014-12-19 VITALS — BP 134/80 | Ht 65.0 in | Wt 267.0 lb

## 2014-12-19 DIAGNOSIS — R87629 Unspecified abnormal cytological findings in specimens from vagina: Secondary | ICD-10-CM

## 2014-12-19 DIAGNOSIS — R896 Abnormal cytological findings in specimens from other organs, systems and tissues: Secondary | ICD-10-CM

## 2014-12-19 DIAGNOSIS — R87619 Unspecified abnormal cytological findings in specimens from cervix uteri: Secondary | ICD-10-CM | POA: Diagnosis not present

## 2014-12-19 NOTE — Patient Instructions (Signed)
Office will call you with biopsy results 

## 2014-12-19 NOTE — Progress Notes (Signed)
Wendy Levine February 20, 1966 [id number]        49 y.o.  G1P1001 Presents for sonohysterogram. Presented for her annual exam without complaints. Pap smear returns atypical glandular cells which appear to be endometrioid. She's having regular light menses. Had sonohysterogram January 2016 due to hypomenorrhea and some bloating complaints which showed a normal pelvis with endometrial echo 5.5 mm. Endometrial biopsy showed atrophic endometrium at that point.  Pap smear last year was negative with negative high risk HPV as was her Pap smears 2012 and 2014.  Past medical history,surgical history, problem list, medications, allergies, family history and social history were all reviewed and documented in the EPIC chart.  Directed ROS with pertinent positives and negatives documented in the history of present illness/assessment and plan.  Exam: [name] assistant Filed Vitals:   12/19/14 1257  BP: 134/80  Height: 5\' 5"  (1.651 m)  Weight: 267 lb (121.11 kg)   General appearance:  Normal Abdomen soft nontender without masses guarding rebound Pelvic external BUS vagina normal. Cervix high in the vaginal vault. Bimanual without gross masses or tenderness but limited by abdominal girth.  Ultrasound shows uterus normal size. Endometrial echo 5.4 mm. Right and left ovaries grossly normal. Cul-de-sac negative.  Sonohysterogram performed, sterile technique, easy catheter introduction, good distention with no abnormalities. Endometrial sample taken. Patient tolerated well.  Assessment/Plan:  49 y.o. G1P1001 with atypical glandular cells favor endometrioid on recent Pap smear. Pap smears preceding this all normal. Sonohistogram within the past year was negative. Sonohysterogram negative today. Endometrial biopsy taken. Patient will follow up for results.  I reviewed the whole issue of atypical glandular cells with the patient and her husband to include endometrial versus endocervical. Potential for higher up  such as fallopian tube discussed although highly unlikely. She was at the very tail end of her menses when the Pap smear was done by her history today and the question is whether this possibly could be an over call from cytology as far as the atypia even the negative sonohysterogram's and hopefully negative biopsy now. We'll have them review that Pap smear in conjunction with today's specimen and then we'll go from there. Various scenarios and possibilities were all reviewed with the patient.    Anastasio Auerbach MD, 1:04 PM 12/19/2014

## 2014-12-24 ENCOUNTER — Telehealth: Payer: Self-pay | Admitting: Gynecology

## 2014-12-24 NOTE — Telephone Encounter (Signed)
Dr. Lyndon Code, pathologist called me in follow up at my request to review her original Pap smear which showed atypical endometrial appearing cells in her other biopsy specimens noting recent Pap smears 2012, 2014 and 2015 all normal with negative HPV and 2 sonohysterogram's this year negative with 2 endometrial biopsy showing inactive endometrium. Dr. Lyndon Code reviewed the slides and he feels now that the endometrioid atypia was actually more reactive and does not feel that is a true atypia that needs to be pursued any further. I discussed with him whether colposcopy would be of any benefit but he feels given the cells were endometrial and her Pap smears were all negative that no further workup is needed. I will call the patient with these results and we will plan on expected management.

## 2015-01-02 ENCOUNTER — Other Ambulatory Visit: Payer: Self-pay | Admitting: Physician Assistant

## 2015-01-02 NOTE — Telephone Encounter (Signed)
Rx called into [facility] on [address].

## 2015-01-29 ENCOUNTER — Other Ambulatory Visit: Payer: Self-pay | Admitting: Internal Medicine

## 2015-01-30 ENCOUNTER — Other Ambulatory Visit: Payer: Self-pay | Admitting: Physician Assistant

## 2015-01-30 MED ORDER — ROSUVASTATIN CALCIUM 10 MG PO TABS: 10.0000 mg | ORAL_TABLET | Freq: Every day | ORAL | Status: DC

## 2015-02-11 ENCOUNTER — Encounter: Payer: Self-pay | Admitting: Family Medicine

## 2015-02-11 ENCOUNTER — Ambulatory Visit (INDEPENDENT_AMBULATORY_CARE_PROVIDER_SITE_OTHER): Payer: BLUE CROSS/BLUE SHIELD | Admitting: Family Medicine

## 2015-02-11 ENCOUNTER — Other Ambulatory Visit (INDEPENDENT_AMBULATORY_CARE_PROVIDER_SITE_OTHER): Payer: BLUE CROSS/BLUE SHIELD

## 2015-02-11 VITALS — BP 124/82 | HR 76 | Ht 65.0 in | Wt 274.0 lb

## 2015-02-11 DIAGNOSIS — M2580 Other specified joint disorders, unspecified joint: Secondary | ICD-10-CM | POA: Insufficient documentation

## 2015-02-11 DIAGNOSIS — M79672 Pain in left foot: Secondary | ICD-10-CM

## 2015-02-11 NOTE — Assessment & Plan Note (Signed)
Patient has become somewhat noncompliant with the conservative treatment for the vitamin D deficiency, iron deficiency, as well as has had significant breakdown of her custom orthotics and shoes that she wears on a regular basis. We did make changes adding a metatarsal pad to her custom orthotics today that did seem to help. We discussed icing regimen as well as starting the vitamin D and iron on a more regular basis. We discussed with patient about home exercises and given handout. Depending on how patient response we will see her again in 3-4 weeks for further evaluation and treatment.

## 2015-02-11 NOTE — Progress Notes (Signed)
Pre visit review using our clinic review tool, if applicable. No additional management support is needed unless otherwise documented below in the visit note. 

## 2015-02-11 NOTE — Patient Instructions (Signed)
Good to see you Duexis 3 times daily for nect 6 days.  Ice at night for 10 minutes before bed Move magnesium in AM and iron at night or vice versa Take 65mg  of iron with 500mg  of vitamin C Made changes to orthotic that should help Good shoes with rigid bottom.  Jalene Mullet, [place] or New balance greater then 700 Start walking dog next week.  See me agai nin 3 weeks to make sure doing well Happy holidays!

## 2015-02-11 NOTE — Progress Notes (Signed)
  [name] [address] [place], [place] [address] Phone: [phone] Subjective:        CC: Bilateral leg pain follow up  [id number] Wendy Levine is a 49 y.o. female coming in with bilateral leg pain. Patient is excellent complaining more of foot pain. Patient states that it seems to be more on the left foot. Seems to be the ball of the foot. Does have a past medical history significant for pes planus bilaterally. Patient has had significant difficulty with her vitamin D as well as iron which patient is taking irregularly at this time. Patient states that when she is walking she seems to do relatively well but certain things such as compression on the foot and seems to make the pain worse. In addition of this patient states that walking barefoot sometimes seems better. Has more of the pain at night and can even wake her up at night. Denies any swelling or redness. Denies any type of injury.    Past medical history, social, surgical and family history all reviewed in electronic medical record.   Review of Systems: No headache, visual changes, nausea, vomiting, diarrhea, constipation, dizziness, abdominal pain, skin rash, fevers, chills, night sweats, weight loss, swollen lymph nodes, body aches, joint swelling, muscle aches, chest pain, shortness of breath, mood changes.   Objective Blood pressure 124/82, pulse 76, height 5\' 5"  (1.651 m), weight 274 lb (124.286 kg), SpO2 97 %.  General: No apparent distress alert and oriented x3 mood and affect normal, dressed appropriately.  HEENT: Pupils equal, extraocular movements intact  Respiratory: Patient's speak in full sentences and does not appear short of breath  Cardiovascular: No lower extremity edema, non tender, no erythema  Skin: Warm dry intact with no signs of infection or rash on extremities or on axial skeleton.  Abdomen: Soft nontender  Neuro: Cranial nerves II through XII are intact,  neurovascularly intact in all extremities with 2+ DTRs and 2+ pulses.  Lymph: No lymphadenopathy of posterior or anterior cervical chain or axillae bilaterally.  Gait normal with good balance and coordination.  MSK:  Non tender with full range of motion and good stability and symmetric strength and tone of shoulders, elbows, wrist, hip, and ankles bilaterally.   Foot exam shows the patient does have pes planus with overpronation of the hindfoot bilaterally right greater than left. Patient is tender to palpation over the sesamoid bone over the first and second metatarsals on the plantar aspect. Patient has mild hallux limitus. Neurovascularly intact. Good range of motion of the ankle. No pain to percussion.  Limited [place] skeletal ultrasound was performed and interpreted by Hulan Saas, M  Limited ultrasound showed the patient did have some hypoechoic changes on the surface of the sesamoid bones of the left foot. Mild increase in Doppler flow. Patient also had some subcutaneous swelling noted as well. No true cortical defect noted.  Impression: Sesamoiditis   Impression and Recommendations:     This case required medical decision making of moderate complexity.

## 2015-02-12 ENCOUNTER — Other Ambulatory Visit: Payer: Self-pay | Admitting: Physician Assistant

## 2015-02-20 ENCOUNTER — Encounter: Payer: Self-pay | Admitting: Physician Assistant

## 2015-02-21 ENCOUNTER — Other Ambulatory Visit: Payer: Self-pay | Admitting: Internal Medicine

## 2015-03-04 ENCOUNTER — Ambulatory Visit: Payer: BLUE CROSS/BLUE SHIELD | Admitting: Family Medicine

## 2015-03-07 ENCOUNTER — Encounter: Payer: Self-pay | Admitting: Family Medicine

## 2015-03-07 ENCOUNTER — Other Ambulatory Visit (INDEPENDENT_AMBULATORY_CARE_PROVIDER_SITE_OTHER): Payer: BLUE CROSS/BLUE SHIELD

## 2015-03-07 ENCOUNTER — Ambulatory Visit (INDEPENDENT_AMBULATORY_CARE_PROVIDER_SITE_OTHER): Payer: BLUE CROSS/BLUE SHIELD | Admitting: Family Medicine

## 2015-03-07 VITALS — BP 124/84 | HR 73 | Ht 65.0 in | Wt 274.0 lb

## 2015-03-07 DIAGNOSIS — M2141 Flat foot [pes planus] (acquired), right foot: Secondary | ICD-10-CM

## 2015-03-07 DIAGNOSIS — G5762 Lesion of plantar nerve, left lower limb: Secondary | ICD-10-CM | POA: Diagnosis not present

## 2015-03-07 DIAGNOSIS — E559 Vitamin D deficiency, unspecified: Secondary | ICD-10-CM

## 2015-03-07 DIAGNOSIS — M2142 Flat foot [pes planus] (acquired), left foot: Secondary | ICD-10-CM

## 2015-03-07 DIAGNOSIS — M2550 Pain in unspecified joint: Secondary | ICD-10-CM

## 2015-03-07 DIAGNOSIS — M2580 Other specified joint disorders, unspecified joint: Secondary | ICD-10-CM | POA: Diagnosis not present

## 2015-03-07 DIAGNOSIS — G5603 Carpal tunnel syndrome, bilateral upper limbs: Secondary | ICD-10-CM | POA: Insufficient documentation

## 2015-03-07 DIAGNOSIS — M79672 Pain in left foot: Secondary | ICD-10-CM

## 2015-03-07 LAB — T4, FREE: Free T4: 0.57 ng/dL — ABNORMAL LOW (ref 0.60–1.60)

## 2015-03-07 LAB — TSH: TSH: 2.92 u[IU]/mL (ref 0.35–4.50)

## 2015-03-07 LAB — CBC WITH DIFFERENTIAL/PLATELET
Basophils Absolute: 0 10*3/uL (ref 0.0–0.1)
Basophils Relative: 0.4 % (ref 0.0–3.0)
Eosinophils Absolute: 0.1 10*3/uL (ref 0.0–0.7)
Eosinophils Relative: 1.4 % (ref 0.0–5.0)
HCT: 36.7 % (ref 36.0–46.0)
Hemoglobin: 12.3 g/dL (ref 12.0–15.0)
Lymphocytes Relative: 16.2 % (ref 12.0–46.0)
Lymphs Abs: 1.4 10*3/uL (ref 0.7–4.0)
MCHC: 33.5 g/dL (ref 30.0–36.0)
MCV: 85.1 fl (ref 78.0–100.0)
Monocytes Absolute: 0.5 10*3/uL (ref 0.1–1.0)
Monocytes Relative: 6.3 % (ref 3.0–12.0)
Neutro Abs: 6.5 10*3/uL (ref 1.4–7.7)
Neutrophils Relative %: 75.7 % (ref 43.0–77.0)
Platelets: 224 10*3/uL (ref 150.0–400.0)
RBC: 4.31 Mil/uL (ref 3.87–5.11)
RDW: 13.7 % (ref 11.5–15.5)
WBC: 8.6 10*3/uL (ref 4.0–10.5)

## 2015-03-07 LAB — CK: Total CK: 44 U/L (ref 7–177)

## 2015-03-07 LAB — VITAMIN D 25 HYDROXY (VIT D DEFICIENCY, FRACTURES): VITD: 62.23 ng/mL (ref 30.00–100.00)

## 2015-03-07 LAB — C-REACTIVE PROTEIN: CRP: 0.7 mg/dL (ref 0.5–20.0)

## 2015-03-07 LAB — SEDIMENTATION RATE: Sed Rate: 15 mm/hr (ref 0–22)

## 2015-03-07 MED ORDER — IBUPROFEN-FAMOTIDINE 800-26.6 MG PO TABS: ORAL_TABLET | ORAL | Status: DC

## 2015-03-07 NOTE — Progress Notes (Signed)
Pre visit review using our clinic review tool, if applicable. No additional management support is needed unless otherwise documented below in the visit note. 

## 2015-03-07 NOTE — Patient Instructions (Addendum)
Good to see you and Happy New Year! We injected a neuroma today Ice is your friend  Duexis up to 3 times a day would be safe.  Try pennsaid on your wrist  We will get labs today  See me again in 3 weeks and continue with the good shoes and avoid being bare foot Do not lace last eye on shoe.

## 2015-03-07 NOTE — Progress Notes (Signed)
[facility] [address] [place], [place] [address] Phone: [phone] Subjective:        CC: Bilateral leg pain follow up  [id number] Wendy Levine is a 50 y.o. female coming in with bilateral leg pain. Patient is excellent complaining more of foot pain. Patient was found to have more of a sesamoiditis. Patient was to do iron supplementation, once weekly vitamin D dosing, icing protocol we discussed proper shoes. Patient was given some exercises to avoid the collection of the transverse and longitudinal arches. Patient was having more of a sesamoiditis. Did make some changes to her custom orthotics. Still continue to have the discomfort. States now she is having some mild numbness on this foot as well. Denies any swelling. States that she is also having some mild numbness in her wrists bilaterally. Not stopping her from activity but can be very uncomfortable especially at night.   Past Medical History  Diagnosis Date  . ASCUS (atypical squamous cells of undetermined significance) on Pap smear     neg HR HPV 09/2008, ascus 06/2009,10/2009  . HSV-2 infection   . Hypercholesteremia   . Hypertension   . OSA on CPAP   . Anemia   . Allergy   . Prediabetes   . Anxiety    Past Surgical History  Procedure Laterality Date  . Cesarean section    . Remove moles    . Colposcopy    . Knee surgery      Arthroscopic  . Mouth surgery     Social History  Substance Use Topics  . Smoking status: Former Smoker    Quit date: 06/08/1998  . Smokeless tobacco: Never Used  . Alcohol Use: 4.2 oz/week    7 Standard drinks or equivalent per week   Allergies  Allergen Reactions  . Doxycycline Hives and Shortness Of Breath  . Ace Inhibitors     cough  . Adhesive [Tape]   . Amoxicillin-Pot Clavulanate Nausea Only    diarrhea  . Clindamycin/Lincomycin   . Vicodin [Hydrocodone-Acetaminophen] Itching   Family History  Problem Relation Age of Onset  .  Hypertension Maternal Grandmother   . Breast cancer Maternal Grandmother 80  . Emphysema Maternal Grandmother   . Hypertension Mother   . Hyperlipidemia Mother   . Cancer Mother     Skin    Past medical history, social, surgical and family history all reviewed in electronic medical record.   Review of Systems: No headache, visual changes, nausea, vomiting, diarrhea, constipation, dizziness, abdominal pain, skin rash, fevers, chills, night sweats, weight loss, swollen lymph nodes, body aches, joint swelling, muscle aches, chest pain, shortness of breath, mood changes.   Objective Blood pressure 124/84, pulse 73, height 5\' 5"  (1.651 m), weight 274 lb (124.286 kg), SpO2 98 %.  General: No apparent distress alert and oriented x3 mood and affect normal, dressed appropriately.  HEENT: Pupils equal, extraocular movements intact  Respiratory: Patient's speak in full sentences and does not appear short of breath  Cardiovascular: No lower extremity edema, non tender, no erythema  Skin: Warm dry intact with no signs of infection or rash on extremities or on axial skeleton.  Abdomen: Soft nontender  Neuro: Cranial nerves II through XII are intact, neurovascularly intact in all extremities with 2+ DTRs and 2+ pulses.  Lymph: No lymphadenopathy of posterior or anterior cervical chain or axillae bilaterally.  Gait normal with good balance and coordination.  MSK:  Non tender with full range of motion and  good stability and symmetric strength and tone of shoulders, elbows, , hip, and ankles bilaterally.  Wrist exam does have a positive Tinel sign bilaterally  Foot exam shows the patient does have pes planus with overpronation of the hindfoot bilaterally right greater than left. Patient is tender to palpation over the sesamoid bone over the first and second metatarsals on the plantar aspect. Patient has mild hallux limitus. Positive squeeze test  Limited [place] skeletal ultrasound was performed and  interpreted by Hulan Saas, M  Limited ultrasound showed the patient did have some hypoechoic changes on the surface of the sesamoid bones of the left foot still present but improved. Were patient is tender more today's between the fourth and fifth metatarsals patient has what appears to be a neuroma. Hypoechoic changes and increasing Doppler flow noted.  Impression: Morton's neuroma  Procedure: Real-time Ultrasound Guided Injection of fourth interspace Morton's neuroma injection on left foot  Device: GE Logiq E  Ultrasound guided injection is preferred based studies that show increased duration, increased effect, greater accuracy, decreased procedural pain, increased response rate, and decreased cost with ultrasound guided versus blind injection.  Verbal informed consent obtained.  Time-out conducted.  Noted no overlying erythema, induration, or other signs of local infection.  Skin prepped in a sterile fashion.  Local anesthesia: Topical Ethyl chloride.  With sterile technique and under real time ultrasound guidance:  With a 25-gauge half-inch needle patient was injected with a total of 0.5 mL of 0.5% Marcaine and 0.5 mL of Kenalog 40 mg/dL. Completed without difficulty  Pain immediately resolved suggesting accurate placement of the medication.  Advised to call if fevers/chills, erythema, induration, drainage, or persistent bleeding.  Images permanently stored and available for review in the ultrasound unit.  Impression: Technically successful ultrasound guided injection.   Impression and Recommendations:     This case required medical decision making of moderate complexity.

## 2015-03-07 NOTE — Assessment & Plan Note (Signed)
We'll get labs to rule out any other organic cause likely more carpal tunnel. If continuing have trouble we will do ultrasound and consider injection.

## 2015-03-07 NOTE — Assessment & Plan Note (Signed)
Encourage same treatment. Patient was treated for a Morton's neuroma to see if this will be beneficial. We discussed the icing regimen. Labs ordered today to see if any other reason for why she is not making any significant improvement.

## 2015-03-07 NOTE — Assessment & Plan Note (Signed)
Tolerate injection very well. We discussed icing regimen. Discussed wearing wider toe shoes. Discussed proper lacing. Patient will come back and see me in 3-4 weeks to make sure she is responding. We have discussed that multiple injections may be necessary.

## 2015-03-07 NOTE — Assessment & Plan Note (Signed)
Rechecking labs 

## 2015-03-07 NOTE — Assessment & Plan Note (Signed)
Continue orthotics. 

## 2015-03-08 LAB — CYCLIC CITRUL PEPTIDE ANTIBODY, IGG: Cyclic Citrullin Peptide Ab: <16 Units

## 2015-03-08 LAB — ANA: Anti Nuclear Antibody(ANA): NEGATIVE

## 2015-04-10 ENCOUNTER — Ambulatory Visit (INDEPENDENT_AMBULATORY_CARE_PROVIDER_SITE_OTHER): Payer: BLUE CROSS/BLUE SHIELD | Admitting: Family Medicine

## 2015-04-10 DIAGNOSIS — J111 Influenza due to unidentified influenza virus with other respiratory manifestations: Secondary | ICD-10-CM | POA: Diagnosis not present

## 2015-04-10 MED ORDER — OSELTAMIVIR PHOSPHATE 75 MG PO CAPS: 75.0000 mg | ORAL_CAPSULE | Freq: Two times a day (BID) | ORAL | Status: DC

## 2015-04-10 MED ORDER — GUAIFENESIN-CODEINE 100-10 MG/5ML PO SOLN: 5.0000 mL | Freq: Three times a day (TID) | ORAL | Status: DC | PRN

## 2015-04-10 NOTE — Patient Instructions (Signed)

## 2015-04-10 NOTE — Progress Notes (Signed)
Wendy Levine MRN: [id number], DOB: Apr 14, 1965, 50 y.o. Date of Encounter: 04/10/2015, 5:57 PM  Primary Physician: [name], MD  Chief Complaint:  Chief Complaint  Patient presents with  . Fever    104 on Monday  . Generalized Body Aches    HPI: 50 y.o. year old female presents with a  2 day history of nasal congestion, post nasal drip, sore throat, and cough. Mild sinus pressure. Afebrile. No chills. Nasal congestion thick and green/yellow. Cough is productive of green/yellow sputum and not associated with time of day. Ears feel full, leading to sensation of muffled hearing. Has tried OTC cold preps without success. No GI complaints.   No sick contacts, recent antibiotics, or recent travels.   No leg trauma, sedentary periods, h/o cancer, or tobacco use.  Past Medical History  Diagnosis Date  . ASCUS (atypical squamous cells of undetermined significance) on Pap smear     neg HR HPV 09/2008, ascus 06/2009,10/2009  . HSV-2 infection   . Hypercholesteremia   . Hypertension   . OSA on CPAP   . Anemia   . Allergy   . Prediabetes   . Anxiety      Home Meds: Prior to Admission medications   Medication Sig Start Date End Date Taking? Authorizing Provider  ALPRAZolam Wendy Levine) 0.5 MG tablet TAKE 1 TABLET BY MOUTH TWICE DAILY AS NEEDED FOR ANXIETY WHEN FLYING 01/02/15  Yes Wendy Mutters, PA-C  Cyanocobalamin (VITAMIN B-12 PO) Take 1,000 mcg by mouth daily.    Yes Historical Provider, MD  escitalopram (LEXAPRO) 10 MG tablet TAKE 1 TABLET (10 MG TOTAL) BY MOUTH DAILY. 02/21/15  Yes Wendy Mutters, PA-C  IRON PO Take by mouth daily.   Yes Historical Provider, MD  MAGNESIUM PO Take by mouth.     Yes Historical Provider, MD  rosuvastatin (CRESTOR) 10 MG tablet Take 1 tablet (10 mg total) by mouth daily. 01/30/15  Yes Wendy Mutters, PA-C  valACYclovir (VALTREX) 500 MG tablet TAKE 1 TABLET BY MOUTH DAILY 03/21/14  Yes Wendy Auerbach, MD  valsartan (DIOVAN) 160 MG tablet  TAKE 1/2 TO 1 (ONE-HALF TO ONE) TABLET BY MOUTH DAILY FOR BLOOD PRESSURE 02/12/15  Yes Wendy Mutters, PA-C  Vitamin D, Ergocalciferol, (DRISDOL) 50000 UNITS CAPS capsule TAKE ONE CAPSULE BY MOUTH THREE TIMES A WEEK Patient taking differently: every 7 (seven) days. TAKE ONE CAPSULE BY MOUTH THREE TIMES A WEEK 06/26/14  Yes Wendy Mutters, PA-C  guaiFENesin-codeine 100-10 MG/5ML syrup Take 5 mLs by mouth 3 (three) times daily as needed for cough. 04/10/15   Wendy Haber, MD  oseltamivir (TAMIFLU) 75 MG capsule Take 1 capsule (75 mg total) by mouth 2 (two) times daily. 04/10/15   Wendy Haber, MD    Allergies:  Allergies  Allergen Reactions  . Doxycycline Hives and Shortness Of Breath  . Ace Inhibitors     cough  . Adhesive [Tape]   . Amoxicillin-Pot Clavulanate Nausea Only    diarrhea  . Clindamycin/Lincomycin   . Vicodin [Hydrocodone-Acetaminophen] Itching    Social History   Social History  . Marital Status: Married    Spouse Name: N/A  . Number of Children: N/A  . Years of Education: N/A   Occupational History  . Not on file.   Social History Main Topics  . Smoking status: Former Smoker    Quit date: 06/08/1998  . Smokeless tobacco: Never Used  . Alcohol Use: 4.2 oz/week    7 Standard drinks or equivalent per week  . Drug  Use: No  . Sexual Activity:    Partners: Male    Birth Control/ Protection: Condom, Rhythm     Comment: 1st intercourse 16 yo-5 partners   Other Topics Concern  . Not on file   Social History Narrative     Review of Systems: Constitutional: negative for chills, fever, night sweats or weight changes Cardiovascular: negative for chest pain or palpitations Respiratory: negative for hemoptysis, wheezing, or shortness of breath Abdominal: negative for abdominal pain, nausea, vomiting or diarrhea Dermatological: negative for rash Neurologic: negative for headache   Physical Exam: There were no vitals taken for this visit., There is no weight  on file to calculate BMI. General: Well developed, well nourished, in no acute distress. Head: Normocephalic, atraumatic, eyes without discharge, sclera non-icteric, nares are congested. Bilateral auditory canals clear, TM's are without perforation, pearly grey with reflective cone of light bilaterally. No sinus TTP. Oral cavity moist, dentition normal. Posterior pharynx with post nasal drip and mild erythema. No peritonsillar abscess or tonsillar exudate. Neck: Supple. No thyromegaly. Full ROM. No lymphadenopathy. Lungs: Coarse breath sounds bilaterally without wheezes, rales, or rhonchi. Breathing is unlabored.  Heart: RRR with S1 S2. No murmurs, rubs, or gallops appreciated. Msk:  Strength and tone normal for age. Extremities: No clubbing or cyanosis. No edema. Neuro: Alert and oriented X 3. Moves all extremities spontaneously. CNII-XII grossly in tact. Psych:  Responds to questions appropriately with a normal affect.    ASSESSMENT AND PLAN:  50 y.o. year old female with bronchitis. -   ICD-9-CM ICD-10-CM   1. Influenza with respiratory manifestation 487.1 J11.1 oseltamivir (TAMIFLU) 75 MG capsule     guaiFENesin-codeine 100-10 MG/5ML syrup   -Tylenol/Motrin prn -Rest/fluids -RTC precautions -RTC 3-5 days if no improvement  Signed, Wendy Haber, MD 04/10/2015 5:57 PM

## 2015-04-13 ENCOUNTER — Other Ambulatory Visit: Payer: Self-pay | Admitting: Internal Medicine

## 2015-04-13 DIAGNOSIS — J4520 Mild intermittent asthma, uncomplicated: Secondary | ICD-10-CM

## 2015-04-13 DIAGNOSIS — J0410 Acute tracheitis without obstruction: Secondary | ICD-10-CM

## 2015-04-13 MED ORDER — AZITHROMYCIN 250 MG PO TABS: ORAL_TABLET | ORAL | Status: DC

## 2015-04-13 MED ORDER — PROMETHAZINE-DM 6.25-15 MG/5ML PO SYRP: ORAL_SOLUTION | ORAL | Status: DC

## 2015-04-13 MED ORDER — PREDNISONE 20 MG PO TABS: ORAL_TABLET | ORAL | Status: DC

## 2015-05-07 ENCOUNTER — Ambulatory Visit (INDEPENDENT_AMBULATORY_CARE_PROVIDER_SITE_OTHER): Payer: BLUE CROSS/BLUE SHIELD | Admitting: Gynecology

## 2015-05-07 ENCOUNTER — Encounter: Payer: Self-pay | Admitting: Gynecology

## 2015-05-07 VITALS — BP 130/80

## 2015-05-07 DIAGNOSIS — Z3009 Encounter for other general counseling and advice on contraception: Secondary | ICD-10-CM | POA: Diagnosis not present

## 2015-05-07 NOTE — Progress Notes (Signed)
    Wendy Levine 1965-07-22 [id number]        50 y.o.  G1P1001 Presents to discuss her contraceptive options. Currently using condoms wants to use something more reliable.  Past medical history,surgical history, problem list, medications, allergies, family history and social history were all reviewed and documented in the EPIC chart.  Directed ROS with pertinent positives and negatives documented in the history of present illness/assessment and plan.  Exam: Filed Vitals:   05/07/15 0959  BP: 130/80   General appearance:  Normal   Assessment/Plan:  50 y.o. G1P1001 for contraceptive discussion. I reviewed all available forms of contraception with her to include pill, patch, ring, Depo-Provera, Nexplanon, IUDs and sterilization. Husband has refused vasectomy. The pros/cons, risks/benefits of each choice discussed. Does have a prior history of smoking but quit in 2000. Possible increased risk of thrombosis associated with this history. After a lengthy discussion the patient has decided that she wants to try Nexplanon. She will schedule an appointment and follow up for this.  Need to present while she is on her menses or with an absence of intercourse proceeding placement from her last menses    Anastasio Auerbach MD, 10:13 AM 05/07/2015

## 2015-05-07 NOTE — Patient Instructions (Signed)
Follow up with your birth control decision 

## 2015-05-08 ENCOUNTER — Telehealth: Payer: Self-pay | Admitting: Gynecology

## 2015-05-08 NOTE — Telephone Encounter (Signed)
05/08/15-I advised pt today that her [facility] ins will cover the Nexplanon for contraception at 100%. Appt already made with TF for insertion. Per [place] at [employer]

## 2015-05-22 ENCOUNTER — Encounter: Payer: Self-pay | Admitting: Gynecology

## 2015-05-22 ENCOUNTER — Ambulatory Visit (INDEPENDENT_AMBULATORY_CARE_PROVIDER_SITE_OTHER): Payer: BLUE CROSS/BLUE SHIELD | Admitting: Gynecology

## 2015-05-22 ENCOUNTER — Encounter: Payer: Self-pay | Admitting: Anesthesiology

## 2015-05-22 VITALS — BP 126/80

## 2015-05-22 DIAGNOSIS — Z30017 Encounter for initial prescription of implantable subdermal contraceptive: Secondary | ICD-10-CM | POA: Diagnosis not present

## 2015-05-22 HISTORY — PX: OTHER SURGICAL HISTORY: SHX169

## 2015-05-22 NOTE — Patient Instructions (Signed)
Etonogestrel implant What is this medicine? ETONOGESTREL (et oh noe JES trel) is a contraceptive (birth control) device. It is used to prevent pregnancy. It can be used for up to 3 years. This medicine may be used for other purposes; ask your health care provider or pharmacist if you have questions. What should I tell my health care provider before I take this medicine? They need to know if you have any of these conditions: -abnormal vaginal bleeding -blood vessel disease or blood clots -cancer of the breast, cervix, or liver -depression -diabetes -gallbladder disease -headaches -heart disease or recent heart attack -high blood pressure -high cholesterol -kidney disease -liver disease -renal disease -seizures -tobacco smoker -an unusual or allergic reaction to etonogestrel, other hormones, anesthetics or antiseptics, medicines, foods, dyes, or preservatives -pregnant or trying to get pregnant -breast-feeding How should I use this medicine? This device is inserted just under the skin on the inner side of your upper arm by a health care professional. Talk to your pediatrician regarding the use of this medicine in children. Special care may be needed. Overdosage: If you think you have taken too much of this medicine contact a poison control center or emergency room at once. NOTE: This medicine is only for you. Do not share this medicine with others. What if I miss a dose? This does not apply. What may interact with this medicine? Do not take this medicine with any of the following medications: -amprenavir -bosentan -fosamprenavir This medicine may also interact with the following medications: -barbiturate medicines for inducing sleep or treating seizures -certain medicines for fungal infections like ketoconazole and itraconazole -griseofulvin -medicines to treat seizures like carbamazepine, felbamate, oxcarbazepine, phenytoin,  topiramate -modafinil -phenylbutazone -rifampin -some medicines to treat HIV infection like atazanavir, indinavir, lopinavir, nelfinavir, tipranavir, ritonavir -St. John's wort This list may not describe all possible interactions. Give your health care provider a list of all the medicines, herbs, non-prescription drugs, or dietary supplements you use. Also tell them if you smoke, drink alcohol, or use illegal drugs. Some items may interact with your medicine. What should I watch for while using this medicine? This product does not protect you against HIV infection (AIDS) or other sexually transmitted diseases. You should be able to feel the implant by pressing your fingertips over the skin where it was inserted. Contact your doctor if you cannot feel the implant, and use a non-hormonal birth control method (such as condoms) until your doctor confirms that the implant is in place. If you feel that the implant may have broken or become bent while in your arm, contact your healthcare provider. What side effects may I notice from receiving this medicine? Side effects that you should report to your doctor or health care professional as soon as possible: -allergic reactions like skin rash, itching or hives, swelling of the face, lips, or tongue -breast lumps -changes in emotions or moods -depressed mood -heavy or prolonged menstrual bleeding -pain, irritation, swelling, or bruising at the insertion site -scar at site of insertion -signs of infection at the insertion site such as fever, and skin redness, pain or discharge -signs of pregnancy -signs and symptoms of a blood clot such as breathing problems; changes in vision; chest pain; severe, sudden headache; pain, swelling, warmth in the leg; trouble speaking; sudden numbness or weakness of the face, arm or leg -signs and symptoms of liver injury like dark yellow or brown urine; general ill feeling or flu-like symptoms; light-colored stools; loss of  appetite; nausea; right upper belly   pain; unusually weak or tired; yellowing of the eyes or skin -unusual vaginal bleeding, discharge -signs and symptoms of a stroke like changes in vision; confusion; trouble speaking or understanding; severe headaches; sudden numbness or weakness of the face, arm or leg; trouble walking; dizziness; loss of balance or coordination Side effects that usually do not require medical attention (Report these to your doctor or health care professional if they continue or are bothersome.): -acne -back pain -breast pain -changes in weight -dizziness -general ill feeling or flu-like symptoms -headache -irregular menstrual bleeding -nausea -sore throat -vaginal irritation or inflammation This list may not describe all possible side effects. Call your doctor for medical advice about side effects. You may report side effects to FDA at 1-800-FDA-1088. Where should I keep my medicine? This drug is given in a hospital or clinic and will not be stored at home. NOTE: This sheet is a summary. It may not cover all possible information. If you have questions about this medicine, talk to your doctor, pharmacist, or health care provider.    2016, Elsevier/Gold Standard. (2013-11-24 14:07:06)  

## 2015-05-22 NOTE — Progress Notes (Signed)
    Wendy Levine 07-Aug-1965 [id number]        50 y.o.  G1P1001 presents for Nexplanon insertion. She previously has been counseled for her contraceptive options and she elects for nexplanon. I reviewed the Nexplanon insertional process and the side effects/risks. I reviewed irregular bleeding, insertion site infections, underlying neurovascular damage with permanent sequela, migration of the implant making removal difficult requiring surgery, the need to have it removed in 3 years under a separate procedure and lastly the risk of failure with pregnancy, possibly increased risk with increased weight. Patient has abstained from intercourse since her normal LMP and she is right-handed. She has read through and signed the consent form.  Procedure with Caryn Bee assistant: Left upper arm examined and marked according to manufacturer's recommendation. The insertion site was cleansed with Betadine solution and the insertional tract infiltrated with 1% lidocaine. The Nexplanon was placed according to manufacturer's recommendation without difficulty. The skin defect was closed with a Steri-Strip. The patient palpated the rod. A pressure dressing was applied and postoperative instructions give her.   Lot number:  [id number]     Anastasio Auerbach MD, 8:25 AM 05/22/2015

## 2015-05-27 ENCOUNTER — Telehealth: Payer: Self-pay | Admitting: *Deleted

## 2015-05-27 NOTE — Telephone Encounter (Signed)
Not unusual to feel some discomfort to her completely heals. This may take several weeks. As long as no signs of infection or other issues I would watch for now.

## 2015-05-27 NOTE — Telephone Encounter (Signed)
Pt informed with the below note. 

## 2015-05-27 NOTE — Telephone Encounter (Signed)
Pt had Nexplanon placed on 05/22/15 states she still has discomfort at the incision site states in certain positions she will get a sharp pinching pain. No other complaints at this area. Pt asked if this normal, states daily it gets better, but still there. Please advise

## 2015-05-28 ENCOUNTER — Encounter: Payer: Self-pay | Admitting: Physician Assistant

## 2015-05-28 ENCOUNTER — Ambulatory Visit (INDEPENDENT_AMBULATORY_CARE_PROVIDER_SITE_OTHER): Payer: BLUE CROSS/BLUE SHIELD | Admitting: Physician Assistant

## 2015-05-28 ENCOUNTER — Other Ambulatory Visit: Payer: Self-pay | Admitting: *Deleted

## 2015-05-28 VITALS — BP 140/94 | HR 82 | Temp 98.4°F | Resp 20 | Ht 66.0 in | Wt 271.0 lb

## 2015-05-28 DIAGNOSIS — E559 Vitamin D deficiency, unspecified: Secondary | ICD-10-CM

## 2015-05-28 DIAGNOSIS — R05 Cough: Secondary | ICD-10-CM

## 2015-05-28 DIAGNOSIS — E7800 Pure hypercholesterolemia, unspecified: Secondary | ICD-10-CM | POA: Diagnosis not present

## 2015-05-28 DIAGNOSIS — I10 Essential (primary) hypertension: Secondary | ICD-10-CM

## 2015-05-28 DIAGNOSIS — E8881 Metabolic syndrome: Secondary | ICD-10-CM

## 2015-05-28 DIAGNOSIS — Z79899 Other long term (current) drug therapy: Secondary | ICD-10-CM | POA: Diagnosis not present

## 2015-05-28 DIAGNOSIS — R0600 Dyspnea, unspecified: Secondary | ICD-10-CM

## 2015-05-28 DIAGNOSIS — R059 Cough, unspecified: Secondary | ICD-10-CM

## 2015-05-28 LAB — CBC WITH DIFFERENTIAL/PLATELET
Basophils Absolute: 0 cells/uL (ref 0–200)
Basophils Relative: 0 %
Eosinophils Absolute: 134 cells/uL (ref 15–500)
Eosinophils Relative: 2 %
HCT: 39.5 % (ref 35.0–45.0)
Hemoglobin: 12.8 g/dL (ref 11.7–15.5)
Lymphocytes Relative: 27 %
Lymphs Abs: 1809 cells/uL (ref 850–3900)
MCH: 27.9 pg (ref 27.0–33.0)
MCHC: 32.4 g/dL (ref 32.0–36.0)
MCV: 86.2 fL (ref 80.0–100.0)
MPV: 10.5 fL (ref 7.5–12.5)
Monocytes Absolute: 603 cells/uL (ref 200–950)
Monocytes Relative: 9 %
Neutro Abs: 4154 cells/uL (ref 1500–7800)
Neutrophils Relative %: 62 %
Platelets: 212 10*3/uL (ref 140–400)
RBC: 4.58 MIL/uL (ref 3.80–5.10)
RDW: 14.9 % (ref 11.0–15.0)
WBC: 6.7 10*3/uL (ref 3.8–10.8)

## 2015-05-28 MED ORDER — IPRATROPIUM-ALBUTEROL 0.5-2.5 (3) MG/3ML IN SOLN
3.0000 mL | Freq: Once | RESPIRATORY_TRACT | Status: AC
  Administered 2015-05-28: 3 mL via RESPIRATORY_TRACT

## 2015-05-28 MED ORDER — VALACYCLOVIR HCL 500 MG PO TABS: 500.0000 mg | ORAL_TABLET | Freq: Every day | ORAL | Status: DC

## 2015-05-28 MED ORDER — PROMETHAZINE-CODEINE 6.25-10 MG/5ML PO SYRP: 5.0000 mL | ORAL_SOLUTION | Freq: Four times a day (QID) | ORAL | Status: DC | PRN

## 2015-05-28 MED ORDER — AZITHROMYCIN 250 MG PO TABS: ORAL_TABLET | ORAL | Status: AC

## 2015-05-28 NOTE — Progress Notes (Signed)
Assessment and Plan:  1. Hypertension -take HCTZ PRN for swelling, enalapril 20mg  1/2 pill, monitor blood pressure at home. Continue DASH diet.  Reminder to go to the ER if any CP, SOB, nausea, dizziness, severe HA, changes vision/speech, left arm numbness and tingling and jaw pain.  2. Cholesterol -Continue diet and exercise. Check cholesterol.   3. Prediabetes  -Continue diet and exercise. Check A1C  4. Vitamin D Def - check level and continue medications.   5. Obesity with co morbidities - long discussion about weight loss, diet, and exercise  6. Cough/Dyspnea Normal echo 2007, no evidence of fluid overload, breathing treatment in the office. Follow up 1 week if not better - DG Chest 2 View; Future - D-dimer, quantitative (not at [facility]) - ipratropium-albuterol (DUONEB) 0.5-2.5 (3) MG/3ML nebulizer solution 3 mL; Take 3 mLs by nebulization once.   Continue diet and meds as discussed. Further disposition pending results of labs. Over 30 minutes of exam, counseling, chart review, and critical decision making was performed  HPI 50 y.o. female  presents for 3 month follow up on hypertension, cholesterol, prediabetes, obesity, and vitamin D deficiency.   Her blood pressure has been controlled at home, she has been off the HCTZ due to advise from [place] , today their BP is BP: (!) 140/94 mmHg,   She does not workout. She denies chest pain, dizziness. She states she has been having SOB since Feb, had flu in Feb, and right now has cough. States has SOB with walking up stairs. Weight is unchanged, no PND, no orthopnea, no edema except some mild swelling, is on CPAP.  Normal echocardiogram 2007.  Wt Readings from Last 3 Encounters:  05/28/15 271 lb (122.925 kg)  03/07/15 274 lb (124.286 kg)  02/11/15 274 lb (124.286 kg)    She is on cholesterol medication, crestor 10mg  and denies myalgias. Her cholesterol is at goal. 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 Last A1C in the office was:  Lab Results  Component Value Date   HGBA1C 5.6 09/19/2014   Patient is on Vitamin D supplement.   Lab Results  Component Value Date   VD25OH 62.23 03/07/2015     BMI is Body mass index is 43.76 kg/(m^2)., she is working on diet and exercise. Wt Readings from Last 3 Encounters:  05/28/15 271 lb (122.925 kg)  03/07/15 274 lb (124.286 kg)  02/11/15 274 lb (124.286 kg)     Current Medications:  Current Outpatient Prescriptions on File Prior to Visit  Medication Sig Dispense Refill  . ALPRAZolam (XANAX) 0.5 MG tablet TAKE 1 TABLET BY MOUTH TWICE DAILY AS NEEDED FOR ANXIETY WHEN FLYING 60 tablet 1  . escitalopram (LEXAPRO) 10 MG tablet TAKE 1 TABLET (10 MG TOTAL) BY MOUTH DAILY. 30 tablet 2  . rosuvastatin (CRESTOR) 10 MG tablet Take 1 tablet (10 mg total) by mouth daily. 30 tablet 3  . valsartan (DIOVAN) 160 MG tablet TAKE 1/2 TO 1 (ONE-HALF TO ONE) TABLET BY MOUTH DAILY FOR BLOOD PRESSURE 30 tablet 2  . Vitamin D, Ergocalciferol, (DRISDOL) 50000 UNITS CAPS capsule TAKE ONE CAPSULE BY MOUTH THREE TIMES A WEEK (Patient  taking differently: Take 50,000 Units by mouth every 7 (seven) days. ) 12 capsule PRN  . Cyanocobalamin (VITAMIN B-12 PO) Take 1,000 mcg by mouth daily. Reported on 05/28/2015    . IRON PO Take by mouth daily. Reported on 05/28/2015    . MAGNESIUM PO Take by mouth. Reported on 05/28/2015     No current facility-administered medications on file prior to visit.   Medical History:  Past Medical History  Diagnosis Date  . ASCUS (atypical squamous cells of undetermined significance) on Pap smear     neg HR HPV 09/2008, ascus 06/2009,10/2009  . HSV-2  infection   . Hypercholesteremia   . Hypertension   . OSA on CPAP   . Anemia   . Allergy   . Prediabetes   . Anxiety    Allergies:  Allergies  Allergen Reactions  . Doxycycline Hives and Shortness Of Breath  . Ace Inhibitors     cough  . Adhesive [Tape]   . Amoxicillin-Pot Clavulanate Nausea Only    diarrhea  . Clindamycin/Lincomycin   . Vicodin [Hydrocodone-Acetaminophen] Itching    Review of Systems:  Review of Systems  Constitutional: Positive for malaise/fatigue. Negative for fever, chills, weight loss and diaphoresis.  HENT: Negative.   Eyes: Negative.   Respiratory: Positive for cough, sputum production, shortness of breath and wheezing. Negative for hemoptysis.   Cardiovascular: Negative.  Negative for chest pain, palpitations, orthopnea, claudication, leg swelling and PND.  Gastrointestinal: Negative.   Genitourinary: Negative.   Musculoskeletal: Negative for myalgias, back pain, joint pain, falls and neck pain.  Skin: Negative.   Neurological: Negative.  Negative for weakness.  Psychiatric/Behavioral: Negative.     Family history- Review and unchanged Social history- Review and unchanged Physical Exam: BP 140/94 mmHg  Pulse 82  Temp(Src) 98.4 F (36.9 C) (Temporal)  Resp 20  Ht 5\' 6"  (1.676 m)  Wt 271 lb (122.925 kg)  BMI 43.76 kg/m2  SpO2 97%  LMP 05/02/2015 Wt Readings from Last 3 Encounters:  05/28/15 271 lb (122.925 kg)  03/07/15 274 lb (124.286 kg)  02/11/15 274 lb (124.286 kg)   General Appearance: Well nourished, in no apparent distress. Eyes: PERRLA, EOMs, conjunctiva no swelling or erythema Sinuses: No Frontal/maxillary tenderness ENT/Mouth: Ext aud canals clear, TMs without erythema, bulging. No erythema, swelling, or exudate on post pharynx.  Tonsils not swollen or erythematous. Hearing normal.  Neck: Supple, thyroid normal.  Respiratory: Respiratory effort normal, BS equal bilaterally without rales, rhonchi, wheezing or stridor.   Cardio: RRR with no MRGs. Brisk peripheral pulses without edema.  Abdomen: Soft, + BS,  Non tender, no guarding, rebound, hernias, masses. Lymphatics: Non tender without lymphadenopathy.  Musculoskeletal: Full ROM, 5/5 strength, Normal gait Skin: Warm, dry without rashes, lesions, ecchymosis.  Neuro: Cranial nerves intact. Normal muscle tone, no cerebellar symptoms. Psych: Awake and oriented X 3, normal affect, Insight and Judgment appropriate.    Vicie Mutters, PA-C 4:39 PM [facility]

## 2015-05-28 NOTE — Patient Instructions (Signed)
Please take the prednisone to help decrease inflammation and therefore decrease symptoms. Take it it with food to avoid GI upset. It can cause increased energy but on the other hand it can make it hard to sleep at night so please take it AT [place], it takes 8-12 hours to start working so it will NOT affect your sleeping if you take it at night with your food!!  If you are diabetic it will increase your sugars so decrease carbs and monitor your sugars closely.    Get chest x ray  Cough, Adult Coughing is a reflex that clears your throat and your airways. Coughing helps to heal and protect your lungs. It is normal to cough occasionally, but a cough that happens with other symptoms or lasts a long time may be a sign of a condition that needs treatment. A cough may last only 2-3 weeks (acute), or it may last longer than 8 weeks (chronic). CAUSES Coughing is commonly caused by:  Breathing in substances that irritate your lungs.  A viral or bacterial respiratory infection.  Allergies.  Asthma.  Postnasal drip.  Smoking.  Acid backing up from the stomach into the esophagus (gastroesophageal reflux).  Certain medicines.  Chronic lung problems, including COPD (or rarely, lung cancer).  Other medical conditions such as heart failure. HOME CARE INSTRUCTIONS  Pay attention to any changes in your symptoms. Take these actions to help with your discomfort:  Take medicines only as told by your health care provider.  If you were prescribed an antibiotic medicine, take it as told by your health care provider. Do not stop taking the antibiotic even if you start to feel better.  Talk with your health care provider before you take a cough suppressant medicine.  Drink enough fluid to keep your urine clear or pale yellow.  If the air is dry, use a cold steam vaporizer or humidifier in your bedroom or your home to help loosen secretions.  Avoid anything that causes you to cough at work or  at home.  If your cough is worse at night, try sleeping in a semi-upright position.  Avoid cigarette smoke. If you smoke, quit smoking. If you need help quitting, ask your health care provider.  Avoid caffeine.  Avoid alcohol.  Rest as needed. SEEK MEDICAL CARE IF:   You have new symptoms.  You cough up pus.  Your cough does not get better after 2-3 weeks, or your cough gets worse.  You cannot control your cough with suppressant medicines and you are losing sleep.  You develop pain that is getting worse or pain that is not controlled with pain medicines.  You have a fever.  You have unexplained weight loss.  You have night sweats. SEEK IMMEDIATE MEDICAL CARE IF:  You cough up blood.  You have difficulty breathing.  Your heartbeat is very fast.   This information is not intended to replace advice given to you by your health care provider. Make sure you discuss any questions you have with your health care provider.   Document Released: 08/08/2010 Document Revised: 10/31/2014 Document Reviewed: 04/18/2014 Elsevier Interactive Patient Education [employer].

## 2015-05-29 ENCOUNTER — Ambulatory Visit (HOSPITAL_COMMUNITY)
Admission: RE | Admit: 2015-05-29 | Discharge: 2015-05-29 | Disposition: A | Discharge status: Home / Self Care | Payer: BLUE CROSS/BLUE SHIELD | Source: Ambulatory Visit | Attending: Physician Assistant | Admitting: Physician Assistant

## 2015-05-29 ENCOUNTER — Encounter: Payer: Self-pay | Admitting: Physician Assistant

## 2015-05-29 DIAGNOSIS — R0602 Shortness of breath: Secondary | ICD-10-CM | POA: Diagnosis not present

## 2015-05-29 DIAGNOSIS — R059 Cough, unspecified: Secondary | ICD-10-CM

## 2015-05-29 DIAGNOSIS — R0600 Dyspnea, unspecified: Secondary | ICD-10-CM

## 2015-05-29 DIAGNOSIS — R05 Cough: Secondary | ICD-10-CM | POA: Diagnosis present

## 2015-05-29 LAB — LIPID PANEL
Cholesterol: 198 mg/dL (ref 125–200)
HDL: 35 mg/dL — ABNORMAL LOW (ref >=46)
LDL Cholesterol: 101 mg/dL (ref <130)
Total CHOL/HDL Ratio: 5.7 Ratio — ABNORMAL HIGH (ref <=5.0)
Triglycerides: 310 mg/dL — ABNORMAL HIGH (ref <150)
VLDL: 62 mg/dL — ABNORMAL HIGH (ref <30)

## 2015-05-29 LAB — BASIC METABOLIC PANEL WITH GFR
BUN: 12 mg/dL (ref 7–25)
CO2: 24 mmol/L (ref 20–31)
Calcium: 8.6 mg/dL (ref 8.6–10.2)
Chloride: 107 mmol/L (ref 98–110)
Creat: 0.82 mg/dL (ref 0.50–1.10)
GFR, Est African American: >89 mL/min (ref >=60)
GFR, Est Non African American: 84 mL/min (ref >=60)
Glucose, Bld: 90 mg/dL (ref 65–99)
Potassium: 3.8 mmol/L (ref 3.5–5.3)
Sodium: 138 mmol/L (ref 135–146)

## 2015-05-29 LAB — D-DIMER, QUANTITATIVE: D-Dimer, Quant: 0.27 ug/mL-FEU (ref 0.00–0.48)

## 2015-05-29 LAB — HEPATIC FUNCTION PANEL
ALT: 14 U/L (ref 6–29)
AST: 14 U/L (ref 10–35)
Albumin: 4.4 g/dL (ref 3.6–5.1)
Alkaline Phosphatase: 66 U/L (ref 33–115)
Bilirubin, Direct: 0.1 mg/dL (ref <=0.2)
Indirect Bilirubin: 0.5 mg/dL (ref 0.2–1.2)
Total Bilirubin: 0.6 mg/dL (ref 0.2–1.2)
Total Protein: 6.8 g/dL (ref 6.1–8.1)

## 2015-05-29 LAB — MAGNESIUM: Magnesium: 2 mg/dL (ref 1.5–2.5)

## 2015-05-30 ENCOUNTER — Encounter: Payer: Self-pay | Admitting: Physician Assistant

## 2015-05-30 ENCOUNTER — Other Ambulatory Visit: Payer: Self-pay | Admitting: Physician Assistant

## 2015-05-30 MED ORDER — BENZONATATE 200 MG PO CAPS: 200.0000 mg | ORAL_CAPSULE | Freq: Three times a day (TID) | ORAL | Status: DC

## 2015-06-20 ENCOUNTER — Encounter: Payer: Self-pay | Admitting: Physician Assistant

## 2015-06-22 ENCOUNTER — Other Ambulatory Visit: Payer: Self-pay | Admitting: Physician Assistant

## 2015-06-24 ENCOUNTER — Other Ambulatory Visit: Payer: Self-pay | Admitting: Physician Assistant

## 2015-06-24 DIAGNOSIS — F411 Generalized anxiety disorder: Secondary | ICD-10-CM

## 2015-06-24 NOTE — Telephone Encounter (Signed)
Rx called into [facility].

## 2015-06-25 ENCOUNTER — Encounter: Payer: Self-pay | Admitting: Physician Assistant

## 2015-06-25 ENCOUNTER — Other Ambulatory Visit: Payer: Self-pay | Admitting: Physician Assistant

## 2015-06-25 DIAGNOSIS — R0600 Dyspnea, unspecified: Secondary | ICD-10-CM

## 2015-06-27 ENCOUNTER — Other Ambulatory Visit (INDEPENDENT_AMBULATORY_CARE_PROVIDER_SITE_OTHER): Payer: BLUE CROSS/BLUE SHIELD

## 2015-06-27 ENCOUNTER — Institutional Professional Consult (permissible substitution): Payer: BLUE CROSS/BLUE SHIELD | Admitting: Pulmonary Disease

## 2015-06-27 ENCOUNTER — Ambulatory Visit (INDEPENDENT_AMBULATORY_CARE_PROVIDER_SITE_OTHER): Payer: BLUE CROSS/BLUE SHIELD | Admitting: Pulmonary Disease

## 2015-06-27 ENCOUNTER — Encounter: Payer: Self-pay | Admitting: Pulmonary Disease

## 2015-06-27 VITALS — BP 134/70 | HR 69 | Ht 66.0 in | Wt 270.2 lb

## 2015-06-27 DIAGNOSIS — R0789 Other chest pain: Secondary | ICD-10-CM

## 2015-06-27 DIAGNOSIS — R0600 Dyspnea, unspecified: Secondary | ICD-10-CM

## 2015-06-27 DIAGNOSIS — G4733 Obstructive sleep apnea (adult) (pediatric): Secondary | ICD-10-CM | POA: Diagnosis not present

## 2015-06-27 DIAGNOSIS — Z9989 Dependence on other enabling machines and devices: Secondary | ICD-10-CM

## 2015-06-27 LAB — CREATININE KINASE MB: CK-MB: 1.2 ng/mL (ref 0.3–4.0)

## 2015-06-27 LAB — TROPONIN I: TNIDX: 0 ug/l (ref 0.00–0.06)

## 2015-06-27 NOTE — Progress Notes (Signed)
Subjective:    Patient ID: Wendy Levine, female    DOB: May 10, 1965, 50 y.o.   MRN: [id number]  HPI She reports starting 3 months ago she had dyspnea. She reports at that time she had "the flu", February 13 with fever. She reports her cough was productive of a bloody mucus. She reports she developed a prolonged cough that mostly resolved in March. Continues to have an intermittent nonproductive cough. She reports her dyspnea is primarily with exertion and also when laying recumbent. She does have trouble with deep breathing. She reports she previously had wheezing that has also resolved. No fever, chills, or sweats recently. She reports 2 days ago she had significant left chest discomfort that resolved spontaneously but also took 2 baby aspirin. She reports she has had pain in her chest previously. This episode lasted nearly the entire day. It occurred after significant strenuous yard work. No sinus congestion or pressure. No reflux, dyspepsia, or morning brash water taste. She was remotely diagnosed with OSA and has consistently used her CPAP. She reports she has been using it to "help her breathe" in the last week. She started a new birth control a month ago but had symptoms before that.   Review of Systems No rashes but does bruise easily. No adenopathy in her neck, groin, or axilla. A pertinent 14 point review of systems is negative except as per the history of presenting illness.  Allergies  Allergen Reactions  . Doxycycline Hives and Shortness Of Breath  . Ace Inhibitors     cough  . Adhesive [Tape]   . Amoxicillin-Pot Clavulanate Nausea Only    diarrhea  . Clindamycin/Lincomycin   . Vicodin [Hydrocodone-Acetaminophen] Itching    Current Outpatient Prescriptions on File Prior to Visit  Medication Sig Dispense Refill  . ALPRAZolam (XANAX) 0.5 MG tablet TAKE 1 TABLET BY MOUTH TWICE DAILY AS NEEDED WHILE FLYING 60 tablet 0  . Cyanocobalamin (VITAMIN B-12 PO) Take 1,000 mcg by mouth  daily. Reported on 05/28/2015    . escitalopram (LEXAPRO) 10 MG tablet TAKE 1 TABLET (10 MG TOTAL) BY MOUTH DAILY. 90 tablet 1  . IRON PO Take by mouth daily. Reported on 05/28/2015    . MAGNESIUM PO Take by mouth daily. Reported on 06/27/2015    . rosuvastatin (CRESTOR) 10 MG tablet Take 1 tablet (10 mg total) by mouth daily. (Patient taking differently: Take 10 mg by mouth. Twice weekly) 30 tablet 3  . valACYclovir (VALTREX) 500 MG tablet Take 1 tablet (500 mg total) by mouth daily. 30 tablet 5  . valsartan (DIOVAN) 160 MG tablet TAKE 1/2 TO 1 (ONE-HALF TO ONE) TABLET BY MOUTH DAILY FOR BLOOD PRESSURE 30 tablet 2  . Vitamin D, Ergocalciferol, (DRISDOL) 50000 UNITS CAPS capsule TAKE ONE CAPSULE BY MOUTH THREE TIMES A WEEK (Patient taking differently: every 7 (seven) days. ) 12 capsule PRN  . benzonatate (TESSALON) 200 MG capsule Take 1 capsule (200 mg total) by mouth 3 (three) times daily. (Patient not taking: Reported on 06/27/2015) 60 capsule 1  . promethazine-codeine (PHENERGAN WITH CODEINE) 6.25-10 MG/5ML syrup Take 5 mLs by mouth every 6 (six) hours as needed for cough. Max: 51mL per day (Patient not taking: Reported on 06/27/2015) 240 mL 0   No current facility-administered medications on file prior to visit.    Past Medical History  Diagnosis Date  . ASCUS (atypical squamous cells of undetermined significance) on Pap smear     neg HR HPV 09/2008, ascus 06/2009,10/2009  . HSV-2  infection   . Hypercholesteremia   . Hypertension   . OSA on CPAP   . Anemia   . Allergy   . Prediabetes   . Anxiety     Past Surgical History  Procedure Laterality Date  . Cesarean section    . Remove moles    . Colposcopy    . Knee surgery      Arthroscopic  . Mouth surgery    . Nexplanon insertion  05/22/2015    Family History  Problem Relation Age of Onset  . Hypertension Maternal Grandmother   . Breast cancer Maternal Grandmother 80  . Emphysema Maternal Grandmother   . Hypertension Mother   .  Hyperlipidemia Mother   . Cancer Mother     Skin    Social History   Social History  . Marital Status: Married    Spouse Name: N/A  . Number of Children: N/A  . 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 Smoking status: Former Smoker -- 0.30 packs/day for 10 years    Types: Cigarettes    Quit date: 06/08/1999  . Smokeless tobacco: Never Used  . Alcohol Use: 4.2 oz/week    7 Standard drinks or equivalent per week     Comment: 3 x weekly  . Drug Use: No  . Sexual Activity:    Partners: Male    Birth Control/ Protection: Condom     Comment: Nexplanon 05/22/2015 1st intercourse 16 yo-5 partners   Other Topics Concern  . None   Social History Narrative   Originally from [place]. Has lived in [place] as well. Previously has traveled to most of the 56 states as well as [place]. Has prior travel to [place]. Previously has worked in Personal assistant and also with veterinarians. Has dogs and cats at home. No bird exposure. No mold exposure. Does have a hot tub/pool combination.       Objective:   Physical Exam BP 134/70 mmHg  Pulse 69  Ht 5\' 6"  (1.676 m)  Wt 270 lb 3.2 oz (122.562 kg)  BMI 43.63 kg/m2  SpO2 98% General:  Awake. Alert. No acute distress. Obese. Integument:  Warm & dry. No rash on exposed skin. No bruising. Lymphatics:  No appreciated cervical or supraclavicular lymphadenoapthy. HEENT:  Moist mucus membranes. No oral ulcers. No scleral injection or icterus.  Cardiovascular:  Regular rate. No edema. No appreciable JVD.  Pulmonary:  Good aeration & clear to auscultation bilaterally. Symmetric chest wall expansion. No accessory muscle use. Abdomen: Soft. Normal bowel sounds. Protuberant. Grossly nontender. Musculoskeletal:  Normal bulk and tone. Hand grip strength 5/5 bilaterally. No joint deformity or effusion appreciated. Neurological:  CN 2-12 grossly in tact. No meningismus. Moving all 4 extremities  equally. Symmetric brachioradialis deep tendon reflexes. Psychiatric:  Mood and affect congruent. Speech normal rhythm, rate & tone.   IMAGING CXR PA/LAT 05/29/15 (personally reviewed by me):  No focal opacity or appreciated. Heart normal in size mediastinum normal in contour.  CARDIAC  EKG 06/27/15 (personally reviewed by me): Normal sinus rhythm. Inverted T waves in lead V1 & V2. No ST segment elevation or depression suggestive of ischemia.  TTE (6/25/7):  LV nomrla in size & EF 55%. No regional wall motion abnormalities. LA & RA normal in size. RV normal in size & function. No aortic stenosis or regurg. No mitral regurg. Trivial tricuspid regurg. No pericardial effusion.   LABS 05/28/15  CBC: 6.7/12.8/39.5/212 BMP: 138/3.8/107/24/12/0.82/90/8.6 LFT: 4.4/6.8/0.6/66/14/14 D-dimer: 0.27    Assessment & Plan:  50 year old female presenting with ongoing dyspnea as well as atypical anterior chest pain. Patient does not have a significant history of tobacco use but does have exposures which could raise the possibility for asthma. Certainly with her atypical chest pain I am concerned about cardiac etiology to her symptoms as well as her chest discomfort. I instructed the patient to seek immediate medical attention if his chest pain recurs. I instructed the patient contact my office if she had any new breathing problems or questions before her next appointment.  1. Dyspnea: Checking full pulmonary function testing & 6 minute walk test on room air at next appointment. 2. Atypical chest pain: Checking troponin I & CK-MB today. Patient instructed to seek immediate medical attention if this recurs. 3. OSA: Continuing on CPAP therapy indefinitely. 4. Follow-up: Patient will return to clinic in 2-4 weeks.  Sonia Baller Ashok Cordia, M.D. [facility] Pager:  [phone] After 3pm or if no response, call [phone] 11:29 AM 06/27/2015

## 2015-06-27 NOTE — Patient Instructions (Signed)
   Seek immediate medical attention if your chest discomfort returns  We will be breathing and walking test at your next appointment  I will see back in 2-4 weeks but please call my office if you have any further questions or concerns

## 2015-07-01 ENCOUNTER — Ambulatory Visit (INDEPENDENT_AMBULATORY_CARE_PROVIDER_SITE_OTHER): Payer: BLUE CROSS/BLUE SHIELD | Admitting: Pulmonary Disease

## 2015-07-01 DIAGNOSIS — R0600 Dyspnea, unspecified: Secondary | ICD-10-CM

## 2015-07-01 DIAGNOSIS — R0789 Other chest pain: Secondary | ICD-10-CM

## 2015-07-01 LAB — PULMONARY FUNCTION TEST
DL/VA % pred: 91 %
DL/VA: 4.49 ml/min/mmHg/L
DLCO cor % pred: 87 %
DLCO cor: 22.39 ml/min/mmHg
DLCO unc % pred: 87 %
DLCO unc: 22.32 ml/min/mmHg
FEF 25-75 Post: 3.2 L/sec
FEF 25-75 Pre: 2.41 L/sec
FEF2575-%Change-Post: 32 %
FEF2575-%Pred-Post: 112 %
FEF2575-%Pred-Pre: 84 %
FEV1-%Change-Post: 7 %
FEV1-%Pred-Post: 95 %
FEV1-%Pred-Pre: 89 %
FEV1-Post: 2.8 L
FEV1-Pre: 2.6 L
FEV1FVC-%Change-Post: 4 %
FEV1FVC-%Pred-Pre: 100 %
FEV6-%Change-Post: 3 %
FEV6-%Pred-Post: 93 %
FEV6-%Pred-Pre: 90 %
FEV6-Post: 3.35 L
FEV6-Pre: 3.25 L
FEV6FVC-%Change-Post: 0 %
FEV6FVC-%Pred-Post: 103 %
FEV6FVC-%Pred-Pre: 103 %
FVC-%Change-Post: 3 %
FVC-%Pred-Post: 91 %
FVC-%Pred-Pre: 88 %
FVC-Post: 3.35 L
FVC-Pre: 3.25 L
Post FEV1/FVC ratio: 83 %
Post FEV6/FVC ratio: 100 %
Pre FEV1/FVC ratio: 80 %
Pre FEV6/FVC Ratio: 100 %
RV % pred: 105 %
RV: 1.93 L
TLC % pred: 101 %
TLC: 5.3 L

## 2015-07-01 NOTE — Progress Notes (Signed)
PFT done today. 

## 2015-07-08 ENCOUNTER — Other Ambulatory Visit: Payer: Self-pay

## 2015-07-08 MED ORDER — VITAMIN D (ERGOCALCIFEROL) 1.25 MG (50000 UNIT) PO CAPS: ORAL_CAPSULE | ORAL | Status: DC

## 2015-07-09 ENCOUNTER — Ambulatory Visit (INDEPENDENT_AMBULATORY_CARE_PROVIDER_SITE_OTHER): Payer: BLUE CROSS/BLUE SHIELD | Admitting: Physician Assistant

## 2015-07-09 ENCOUNTER — Encounter: Payer: Self-pay | Admitting: Physician Assistant

## 2015-07-09 VITALS — BP 120/82 | HR 80 | Temp 97.3°F | Resp 16 | Ht 65.0 in | Wt 270.8 lb

## 2015-07-09 DIAGNOSIS — R6889 Other general symptoms and signs: Secondary | ICD-10-CM

## 2015-07-09 DIAGNOSIS — Z0001 Encounter for general adult medical examination with abnormal findings: Secondary | ICD-10-CM

## 2015-07-09 DIAGNOSIS — Z79899 Other long term (current) drug therapy: Secondary | ICD-10-CM

## 2015-07-09 DIAGNOSIS — E7800 Pure hypercholesterolemia, unspecified: Secondary | ICD-10-CM | POA: Diagnosis not present

## 2015-07-09 DIAGNOSIS — E559 Vitamin D deficiency, unspecified: Secondary | ICD-10-CM | POA: Diagnosis not present

## 2015-07-09 DIAGNOSIS — B009 Herpesviral infection, unspecified: Secondary | ICD-10-CM | POA: Diagnosis not present

## 2015-07-09 DIAGNOSIS — E8881 Metabolic syndrome: Secondary | ICD-10-CM

## 2015-07-09 DIAGNOSIS — E88819 Insulin resistance, unspecified: Secondary | ICD-10-CM

## 2015-07-09 DIAGNOSIS — Z23 Encounter for immunization: Secondary | ICD-10-CM | POA: Diagnosis not present

## 2015-07-09 DIAGNOSIS — R896 Abnormal cytological findings in specimens from other organs, systems and tissues: Secondary | ICD-10-CM

## 2015-07-09 DIAGNOSIS — I10 Essential (primary) hypertension: Secondary | ICD-10-CM | POA: Diagnosis not present

## 2015-07-09 DIAGNOSIS — M1711 Unilateral primary osteoarthritis, right knee: Secondary | ICD-10-CM

## 2015-07-09 DIAGNOSIS — D649 Anemia, unspecified: Secondary | ICD-10-CM

## 2015-07-09 DIAGNOSIS — IMO0002 Reserved for concepts with insufficient information to code with codable children: Secondary | ICD-10-CM

## 2015-07-09 DIAGNOSIS — K219 Gastro-esophageal reflux disease without esophagitis: Secondary | ICD-10-CM

## 2015-07-09 DIAGNOSIS — Z1159 Encounter for screening for other viral diseases: Secondary | ICD-10-CM

## 2015-07-09 DIAGNOSIS — M2142 Flat foot [pes planus] (acquired), left foot: Secondary | ICD-10-CM

## 2015-07-09 DIAGNOSIS — E669 Obesity, unspecified: Secondary | ICD-10-CM

## 2015-07-09 DIAGNOSIS — M2141 Flat foot [pes planus] (acquired), right foot: Secondary | ICD-10-CM | POA: Diagnosis not present

## 2015-07-09 DIAGNOSIS — R0600 Dyspnea, unspecified: Secondary | ICD-10-CM

## 2015-07-09 DIAGNOSIS — G4733 Obstructive sleep apnea (adult) (pediatric): Secondary | ICD-10-CM

## 2015-07-09 LAB — CBC WITH DIFFERENTIAL/PLATELET
Basophils Absolute: 0 cells/uL (ref 0–200)
Basophils Relative: 0 %
Eosinophils Absolute: 72 cells/uL (ref 15–500)
Eosinophils Relative: 1 %
HCT: 38.4 % (ref 35.0–45.0)
Hemoglobin: 12.6 g/dL (ref 11.7–15.5)
Lymphocytes Relative: 24 %
Lymphs Abs: 1728 cells/uL (ref 850–3900)
MCH: 28.5 pg (ref 27.0–33.0)
MCHC: 32.8 g/dL (ref 32.0–36.0)
MCV: 86.9 fL (ref 80.0–100.0)
MPV: 10.2 fL (ref 7.5–12.5)
Monocytes Absolute: 432 cells/uL (ref 200–950)
Monocytes Relative: 6 %
Neutro Abs: 4968 cells/uL (ref 1500–7800)
Neutrophils Relative %: 69 %
Platelets: 234 10*3/uL (ref 140–400)
RBC: 4.42 MIL/uL (ref 3.80–5.10)
RDW: 15.1 % — ABNORMAL HIGH (ref 11.0–15.0)
WBC: 7.2 10*3/uL (ref 3.8–10.8)

## 2015-07-09 LAB — HEMOGLOBIN A1C
Hgb A1c MFr Bld: 5.7 % — ABNORMAL HIGH (ref <5.7)
Mean Plasma Glucose: 117 mg/dL

## 2015-07-09 MED ORDER — FLUCONAZOLE 150 MG PO TABS: 150.0000 mg | ORAL_TABLET | Freq: Once | ORAL | Status: DC

## 2015-07-09 NOTE — Patient Instructions (Addendum)
[facility] with no obligation # [phone] MUST BE A MEMBER Call for store hours   RESTART THE HCTZ ONLY 12.5MG  DAILY TO SEE IF THIS HELPS WITH THE VERTIGO  Please monitor your blood pressure. If it is getting below 130/80 AND you are having fatigue with exertion, dizziness we may need to cut your blood pressure medication in half. Please call the office if this is happening. Hypotension As your heart beats, it forces blood through your body. This force is called blood pressure. If you have hypotension, you have low blood pressure. When your blood pressure is too low, you may not get enough blood to your brain. You may feel weak, feel lightheaded, have a fast heartbeat, or even pass out (faint). HOME CARE  Drink enough fluids to keep your pee (urine) clear or pale yellow.  Take all medicines as told by your doctor.  Get up slowly after sitting or lying down.  Wear support stockings as told by your doctor.  Maintain a healthy diet by including foods such as fruits, vegetables, nuts, whole grains, and lean meats. GET HELP IF:  You are throwing up (vomiting) or have watery poop (diarrhea).  You have a fever for more than 2-3 days.  You feel more thirsty than usual.  You feel weak and tired. GET HELP RIGHT AWAY IF:   You pass out (faint).  You have chest pain or a fast or irregular heartbeat.  You lose feeling in part of your body.  You cannot move your arms or legs.  You have trouble speaking.  You get sweaty or feel lightheaded. MAKE SURE YOU:   Understand these instructions.  Will watch your condition.  Will get help right away if you are not doing well or get worse. Document Released: 05/06/2009 Document Revised: 10/12/2012 Document Reviewed: 08/12/2012 [facility] Patient Information 2015 [place], [place]. This information is not intended to replace advice given to you by your health care provider. Make sure you discuss any questions you  have with your health care provider.   Benign Paroxysmal Positional Vertigo (BPPV)  General Information In Benign Paroxysmal Positional Vertigo (BPPV) dizziness is generally thought to be due to debris which has collected within a part of the inner ear. This debris can be thought of as "ear rocks", although the formal name is "otoconia". Ear rocks are small crystals of calcium carbonate derived from a structure in the ear called the "utricle" (figure to the right ). The symptoms of BPPV include dizziness or vertigo, lightheadedness, imbalance, and nausea. Activities which bring on symptoms will vary among persons, but symptoms are usually followed by a change of position of the head like getting out of bed or rolling over in bed are common "problem" motions .  However if you have worsening HA, changes vision/speech, weakness go to the ER     What can be done? 1) Use two or more pillows at night. Avoid sleeping on the "bad" side. In the morning, get up slowly and sit on the edge of the bed for a minute. 2) Medication prescribed by your doctor.  3) The exercises below, you can do at home to help you prevent the sensation later in the day.  4) We can refer you to physical therapy at [facility], # [place] treatments: The Brandt-Daroff Exercises are a home method of treating BPPV,and are effective 95% of the time.  These exercises are performed in three sets per day  for two weeks. In each set, one performs the maneuver as shown five times. Start sitting upright (position 1). Then move into the side-lying position (position 2), with the head angled upward about halfway. An easy way to remember this is to imagine someone standing about 6 feet in front of you, and just keep looking at their head at all times. Stay in the side-lying position for 30 seconds, or until the dizziness subsides if this is longer, then go back to the sitting position (position 3). Stay there for 30  seconds, and then go to the opposite side (position 4) and follow the same routine.  At home Epley Maneuver This procedure seems to be even more effective than the in-office procedure, perhaps because it is repeated every night for a week.  The method (for the left side) is performed as shown on the figure below.  1) One stays in each of the supine (lying down) positions for 30 seconds, and in the sitting upright position (top) for 1 minute.  2) Thus, once cycle takes 2 1/2 minutes.  3) Typically 3 cycles are performed just prior to going to sleep.  4) It is best to do them at night rather than in the morning or midday, as if one becomes dizzy following the exercises, then it can resolve while one is sleeping.  The mirror image of this procedure is used for the right ear.

## 2015-07-09 NOTE — Progress Notes (Signed)
Complete Physical  Assessment and Plan: 1. Essential hypertension - continue medications, DASH diet, exercise and monitor at home. Call if greater than 130/80.  - CBC with Differential/Platelet - BASIC METABOLIC PANEL WITH GFR - Hepatic function panel - TSH - Urinalysis, Routine w reflex microscopic - Microalbumin / creatinine urine ratio - EKG 12-Lead  2. Hypercholesteremia -continue medications, check lipids, decrease fatty foods, increase activity.  - Lipid panel  3. Obesity Obesity with co morbidities- long discussion about weight loss, diet, and exercise  4. Insulin resistance Discussed general issues about diabetes pathophysiology and management., Educational material distributed., Suggested low cholesterol diet., Encouraged aerobic exercise., Discussed foot care., Reminded to get yearly retinal exam. - Hemoglobin A1c - Insulin, fasting  5. Primary localized osteoarthrosis, lower leg, right Continue swimming, follow up ortho PRN  6. HSV-2 infection Refill med PRN  7. ASCUS favoring benign Fu OB/GYN  8. Pes planus of both feet Wear better shoes, weight loss advised  9. Gastroesophageal reflux disease without esophagitis Continue PPI/H2 blocker, diet discussed  10. OSA (obstructive sleep apnea) Sleep apnea- continue CPAP, weight loss advised  11. Vitamin D deficiency - Vit D  25 hydroxy (rtn osteoporosis monitoring)  12. Medication management - Magnesium  13. Anemia, unspecified anemia type - Iron and TIBC - Ferritin - Vitamin B12  14. Dyspnea Continue follow up pulmonary, normal EKG, atypical CP On ARB, add on Nexium for possible silent reflux Follow up with Cardio  15. Vertigo Dizziness to the right, unchanged, no decreased hearing/ringing, no vision changes.   Discussed med's effects and SE's. Screening labs and tests as requested with regular follow-up as recommended. Over 40 minutes of exam, counseling, chart review, and critical decision making  was performed this visit.   HPI  50 y.o. female  presents for a complete physical.  Her blood pressure has been controlled at home, switched ACE to ARB, off HCTZ, today their BP is BP: 120/82 mmHg She does not workout. She denies dizziness. She has been having SOB with stairs and walking, has seen pulmonary and has had PFTS, has had some remote chest pains. Will follow with cardiology. Normal Echo 2007. Weight is unchanged/decreased. If she is riding a bike up hill she has SOB, has SOB with lying down occ. She states she has had some atypical chest pain x 2 weeks, she had a similar CP prior to CPAP, however this has returned, left sided chest pain, dull, very localized, normally happens at night after lying down for a while, can last 1-2 hours, one day happened x 1 full day.  Has seen Dr. Milinda Hirschfeld. She is on allegra, she has history of GERD and is not on medication.  Wt Readings from Last 3 Encounters:  07/09/15 270 lb 12.8 oz (122.834 kg)  06/27/15 270 lb 3.2 oz (122.562 kg)  05/28/15 271 lb (122.925 kg)   She is on cholesterol medication, crestor 10mg  and denies myalgias. Her cholesterol is at goal. The cholesterol last visit was:   Lab Results  Component Value Date   CHOL 198 05/28/2015   HDL 35* 05/28/2015   LDLCALC 101 05/28/2015   TRIG 310* 05/28/2015   CHOLHDL 5.7* 05/28/2015   She has been working on diet and exercise for insulin resistance, and denies paresthesia of the feet, polydipsia, polyuria and visual disturbances. Last A1C in the office was:  Lab Results  Component Value Date   HGBA1C 5.6 09/19/2014  Patient is on Vitamin D supplement.   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 Phineas Real She is on lexapro for PMS which helps.  Has history of GERD.  Marland Kitchen  BMI is Body mass index is 45.06 kg/(m^2)., she is working on diet and exercise. She has a  history of OSA on CPAP and sleeps well with it but feels she is more dependent on it.  Wt Readings from Last 3 Encounters:  07/09/15 270 lb 12.8 oz (122.834 kg)  06/27/15 270 lb 3.2 oz (122.562 kg)  05/28/15 271 lb (122.925 kg)    Current Medications:  Current Outpatient Prescriptions on File Prior to Visit  Medication Sig Dispense Refill  . ALPRAZolam (XANAX) 0.5 MG tablet TAKE 1 TABLET BY MOUTH TWICE DAILY AS NEEDED WHILE FLYING 60 tablet 0  . aspirin 81 MG tablet Take 81 mg by mouth as needed for pain.    . Cyanocobalamin (VITAMIN B-12 PO) Take 1,000 mcg by mouth daily. Reported on 05/28/2015    . escitalopram (LEXAPRO) 10 MG tablet TAKE 1 TABLET (10 MG TOTAL) BY MOUTH DAILY. 90 tablet 1  . etonogestrel (NEXPLANON) 68 MG IMPL implant 1 each by Subdermal route once.    . IRON PO Take by mouth daily. Reported on 05/28/2015    . MAGNESIUM PO Take by mouth daily. Reported on 06/27/2015    . rosuvastatin (CRESTOR) 10 MG tablet Take 1 tablet (10 mg total) by mouth daily. (Patient taking differently: Take 10 mg by mouth. Twice weekly) 30 tablet 3  . valACYclovir (VALTREX) 500 MG tablet Take 1 tablet (500 mg total) by mouth daily. 30 tablet 5  . valsartan (DIOVAN) 160 MG tablet TAKE 1/2 TO 1 (ONE-HALF TO ONE) TABLET BY MOUTH DAILY FOR BLOOD PRESSURE 30 tablet 2  . Vitamin D, Ergocalciferol, (DRISDOL) 50000 units CAPS capsule TAKE ONE CAPSULE BY MOUTH THREE TIMES A WEEK 12 capsule PRN   No current facility-administered medications on file prior to visit.   Health Maintenance:   Immunization History  Administered Date(s) Administered  . Influenza Split 02/02/2011  . Influenza,inj,Quad PF,36+ Mos 11/21/2012, 11/10/2013, 11/14/2014  . Td 06/23/2005   Tetanus: 2007 DUE this year Pneumovax: N/A Prevnar 13: N/A Flu vaccine: 2016 Zostavax: N/A Pap: 10/2014 normal, Dr. Phineas Real  MGM: 09/2014 DEXA: 08/2014 Colonoscopy: 2005 Normal out of state Needs FU Hpylori breath test 2016  negative EGD:N/A Echo 2007 PFT 06/2015 Last Eye Exam: Dr. Clydene Laming, yesterday Dentist: Dr. Jake Seats Orthodontist- got braces Dr. Ulyess Blossom  Patient Care Team: Unk Pinto, MD as PCP - General (Internal Medicine) Berenice Primas, MD as Referring Physician (Orthopedic Surgery) Lelon Perla, MD as Consulting Physician (Cardiology) Anastasio Auerbach, MD as Consulting Physician (Gynecology) Rolm Bookbinder, MD as Consulting Physician (Dermatology)   Allergies:  Allergies  Allergen Reactions  . Doxycycline Hives and Shortness Of Breath  . Ace Inhibitors     cough  . Adhesive [Tape]   . Amoxicillin-Pot Clavulanate Nausea Only    diarrhea  . Clindamycin/Lincomycin   . Vicodin [Hydrocodone-Acetaminophen] Itching   Medical History:  Past Medical History  Diagnosis Date  . ASCUS (atypical squamous cells of undetermined significance) on Pap smear     neg HR HPV 09/2008, ascus 06/2009,10/2009  . HSV-2 infection   . Hypercholesteremia   . Hypertension   . OSA on CPAP   . Anemia   . Allergy   . Prediabetes   .  Anxiety    Surgical History:  Past Surgical History  Procedure Laterality Date  . Cesarean section    . Remove moles    . Colposcopy    . Knee surgery      Arthroscopic  . Mouth surgery    . Nexplanon insertion  05/22/2015   Family History:  Family History  Problem Relation Age of Onset  . Hypertension Maternal Grandmother   . Breast cancer Maternal Grandmother 80  . Emphysema Maternal Grandmother   . Hypertension Mother   . Hyperlipidemia Mother   . Cancer Mother     Skin   Social History:  Social History  Substance Use Topics  . Smoking status: Former Smoker -- 0.30 packs/day for 10 years    Types: Cigarettes    Quit date: 06/08/1999  . Smokeless tobacco: Never Used  . Alcohol Use: 4.2 oz/week    7 Standard drinks or equivalent per week     Comment: 3 x weekly   Review of Systems: Review of Systems  Constitutional: Negative.   HENT: Negative.    Eyes: Negative.   Respiratory: Positive for shortness of breath.   Cardiovascular: Positive for chest pain and orthopnea. Negative for palpitations, claudication, leg swelling and PND.  Gastrointestinal: Positive for heartburn. Negative for nausea, vomiting, abdominal pain, diarrhea, constipation, blood in stool and melena.  Genitourinary: Negative.   Musculoskeletal: Positive for joint pain. Negative for myalgias, back pain, falls and neck pain.  Skin: Negative.   Neurological: Negative.   Endo/Heme/Allergies: Negative for environmental allergies and polydipsia. Does not bruise/bleed easily.  Psychiatric/Behavioral: Negative.     Physical Exam: Estimated body mass index is 45.06 kg/(m^2) as calculated from the following:   Height as of this encounter: 5\' 5"  (1.651 m).   Weight as of this encounter: 270 lb 12.8 oz (122.834 kg). BP 120/82 mmHg  Pulse 80  Temp(Src) 97.3 F (36.3 C) (Temporal)  Resp 16  Ht 5\' 5"  (1.651 m)  Wt 270 lb 12.8 oz (122.834 kg)  BMI 45.06 kg/m2  SpO2 98% General Appearance: Well nourished, in no apparent distress.  Eyes: PERRLA, EOMs, conjunctiva no swelling or erythema, normal fundi and vessels.  Sinuses: No Frontal/maxillary tenderness  ENT/Mouth: Ext aud canals clear, normal light reflex with TMs without erythema, bulging. Good dentition. No erythema, swelling, or exudate on post pharynx. Tonsils not swollen or erythematous. Hearing normal.  Neck: Supple, thyroid normal. No bruits  Respiratory: Respiratory effort normal, BS equal bilaterally without rales, rhonchi, wheezing or stridor.  Cardio: RRR without murmurs, rubs or gallops. Brisk peripheral pulses without edema.  Chest: symmetric, with normal excursions and percussion.  Breasts: defer Abdomen: Soft, nontender, no guarding, rebound, hernias, masses, or organomegaly.  Lymphatics: Non tender without lymphadenopathy.  Genitourinary: defer Musculoskeletal: Full ROM all peripheral extremities,5/5  strength, and normal gait.  Skin: defer Neuro: Cranial nerves intact, reflexes equal bilaterally. Normal muscle tone, no cerebellar symptoms. Sensation intact.  Psych: Awake and oriented X 3, normal affect, Insight and Judgment appropriate.   EKG: declines AORTA SCAN: defer  Vicie Mutters 2:09 PM [facility]

## 2015-07-10 LAB — URINALYSIS, ROUTINE W REFLEX MICROSCOPIC
Bilirubin Urine: NEGATIVE
Glucose, UA: NEGATIVE
Hgb urine dipstick: NEGATIVE
Ketones, ur: NEGATIVE
Leukocytes, UA: NEGATIVE
Nitrite: NEGATIVE
Protein, ur: NEGATIVE
Specific Gravity, Urine: 1.027 (ref 1.001–1.035)
pH: 6 (ref 5.0–8.0)

## 2015-07-10 LAB — BASIC METABOLIC PANEL WITH GFR
BUN: 19 mg/dL (ref 7–25)
CO2: 23 mmol/L (ref 20–31)
Calcium: 8.9 mg/dL (ref 8.6–10.2)
Chloride: 105 mmol/L (ref 98–110)
Creat: 1.14 mg/dL — ABNORMAL HIGH (ref 0.50–1.10)
GFR, Est African American: 65 mL/min (ref >=60)
GFR, Est Non African American: 57 mL/min — ABNORMAL LOW (ref >=60)
Glucose, Bld: 96 mg/dL (ref 65–99)
Potassium: 4.4 mmol/L (ref 3.5–5.3)
Sodium: 139 mmol/L (ref 135–146)

## 2015-07-10 LAB — MICROALBUMIN / CREATININE URINE RATIO
Creatinine, Urine: 186 mg/dL (ref 20–320)
Microalb Creat Ratio: 3 mcg/mg creat (ref <30)
Microalb, Ur: 0.5 mg/dL

## 2015-07-10 LAB — INSULIN, FASTING: Insulin fasting, serum: 31.2 u[IU]/mL — ABNORMAL HIGH (ref 2.0–19.6)

## 2015-07-10 LAB — HEPATIC FUNCTION PANEL
ALT: 16 U/L (ref 6–29)
AST: 15 U/L (ref 10–35)
Albumin: 4.4 g/dL (ref 3.6–5.1)
Alkaline Phosphatase: 67 U/L (ref 33–115)
Bilirubin, Direct: 0.1 mg/dL (ref <=0.2)
Indirect Bilirubin: 0.3 mg/dL (ref 0.2–1.2)
Total Bilirubin: 0.4 mg/dL (ref 0.2–1.2)
Total Protein: 7 g/dL (ref 6.1–8.1)

## 2015-07-10 LAB — IRON AND TIBC
%SAT: 13 % (ref 11–50)
Iron: 49 ug/dL (ref 40–190)
TIBC: 380 ug/dL (ref 250–450)
UIBC: 331 ug/dL (ref 125–400)

## 2015-07-10 LAB — VITAMIN B12: Vitamin B-12: 800 pg/mL (ref 200–1100)

## 2015-07-10 LAB — HIV ANTIBODY (ROUTINE TESTING W REFLEX): HIV 1&2 Ab, 4th Generation: NONREACTIVE

## 2015-07-10 LAB — RPR

## 2015-07-10 LAB — TSH: TSH: 1.46 mIU/L

## 2015-07-10 LAB — FERRITIN: Ferritin: 76 ng/mL (ref 10–232)

## 2015-07-11 ENCOUNTER — Encounter: Payer: Self-pay | Admitting: Physician Assistant

## 2015-07-12 ENCOUNTER — Encounter: Payer: Self-pay | Admitting: Physician Assistant

## 2015-07-12 DIAGNOSIS — H9190 Unspecified hearing loss, unspecified ear: Secondary | ICD-10-CM

## 2015-07-12 DIAGNOSIS — H81391 Other peripheral vertigo, right ear: Secondary | ICD-10-CM

## 2015-07-19 ENCOUNTER — Encounter: Payer: Self-pay | Admitting: Physician Assistant

## 2015-07-31 ENCOUNTER — Ambulatory Visit (INDEPENDENT_AMBULATORY_CARE_PROVIDER_SITE_OTHER): Payer: BLUE CROSS/BLUE SHIELD | Admitting: Pulmonary Disease

## 2015-07-31 ENCOUNTER — Ambulatory Visit: Payer: BLUE CROSS/BLUE SHIELD | Admitting: Pulmonary Disease

## 2015-07-31 DIAGNOSIS — R0789 Other chest pain: Secondary | ICD-10-CM

## 2015-07-31 DIAGNOSIS — R0600 Dyspnea, unspecified: Secondary | ICD-10-CM

## 2015-07-31 NOTE — Progress Notes (Signed)
6MWT 07/31/15:  Walked 528 meters / Baseline Sat 98% on RA / Nadir Sat 95% on RA  PFT 07/01/15: FVC 3.25 L (80%) FEV1 2.60 L (89%) FEV1/FVC 0.80 FEF 25-75 2.41 L (84%) no bronchodilator response TLC 5.30 L (101%) ERV 10% DLCO uncorrected 87% (hemoglobin 13.3)

## 2015-08-08 ENCOUNTER — Ambulatory Visit (INDEPENDENT_AMBULATORY_CARE_PROVIDER_SITE_OTHER): Payer: BLUE CROSS/BLUE SHIELD | Admitting: Physician Assistant

## 2015-08-08 ENCOUNTER — Encounter: Payer: Self-pay | Admitting: Physician Assistant

## 2015-08-08 VITALS — BP 130/82 | HR 76 | Temp 97.9°F | Resp 16 | Ht 65.0 in | Wt 261.8 lb

## 2015-08-08 DIAGNOSIS — E7800 Pure hypercholesterolemia, unspecified: Secondary | ICD-10-CM

## 2015-08-08 DIAGNOSIS — I10 Essential (primary) hypertension: Secondary | ICD-10-CM

## 2015-08-08 DIAGNOSIS — R0600 Dyspnea, unspecified: Secondary | ICD-10-CM

## 2015-08-08 DIAGNOSIS — E669 Obesity, unspecified: Secondary | ICD-10-CM

## 2015-08-08 LAB — CBC WITH DIFFERENTIAL/PLATELET
Basophils Absolute: 56 cells/uL (ref 0–200)
Basophils Relative: 1 %
Eosinophils Absolute: 56 cells/uL (ref 15–500)
Eosinophils Relative: 1 %
HCT: 41.1 % (ref 35.0–45.0)
Hemoglobin: 13.4 g/dL (ref 11.7–15.5)
Lymphocytes Relative: 24 %
Lymphs Abs: 1344 cells/uL (ref 850–3900)
MCH: 28.7 pg (ref 27.0–33.0)
MCHC: 32.6 g/dL (ref 32.0–36.0)
MCV: 88 fL (ref 80.0–100.0)
MPV: 10.7 fL (ref 7.5–12.5)
Monocytes Absolute: 336 cells/uL (ref 200–950)
Monocytes Relative: 6 %
Neutro Abs: 3808 cells/uL (ref 1500–7800)
Neutrophils Relative %: 68 %
Platelets: 232 10*3/uL (ref 140–400)
RBC: 4.67 MIL/uL (ref 3.80–5.10)
RDW: 14.2 % (ref 11.0–15.0)
WBC: 5.6 10*3/uL (ref 3.8–10.8)

## 2015-08-08 LAB — D-DIMER, QUANTITATIVE: D-Dimer, Quant: 0.32 mcg/mL FEU (ref <0.50)

## 2015-08-08 NOTE — Progress Notes (Signed)
Assessment and Plan: Dyspnea with exertion- following with pulmonary, encouraged follow up, atypical chest pain but will refer to cardiology for possible echo, recheck DDimer with CP/dyspnea, check BNP. ? Versus deconditioning from weight gain- weight is now down 9 lbs, continue weight loss. Versus need retitration of CPAP Cholesterol- will recheck lipids and LFTs with increase in crestor.  Obesity with co morbidities- long discussion about weight loss, diet, and exercise  Future Appointments Date Time Provider [place]  08/12/2015 9:45 AM Javier Glazier, MD [facility] None  07/16/2016 10:00 AM Vicie Mutters, PA-C [facility] None     HPI 50 y.o.female presents for follow up for dyspnea with exertion. She has see Dr. Milinda Hirschfeld, had normal PFTs.  She also had hyperlipidemia and is on 2 crestor daily, we will check LFTs. She states she is no longer having dizziness, she is only out of breath with hills, occasional chest discomfort, without exertion, left sided very localized, can last mins to hours, no radiation, denies any other symptoms with it. Normal CXR 05/2015, normal echo 2007. She has been treated with allergies and GERD without any changes in symptoms. She states she has had issues prior to nexplanon insertion. She has stopped the valcyclovir but it has not helped symptoms. Has been having "chills" across her chest and arm, states she will chatter.   She has HTN, chol, preDM, OSA, no family history of MI, normal HIV, CKMB, troponin, Ddimer, sed rate. Marland Kitchen  BMI is Body mass index is 43.57 kg/(m^2)., she is working on diet and exercise. Wt Readings from Last 3 Encounters:  08/08/15 261 lb 12.8 oz (118.752 kg)  07/09/15 270 lb 12.8 oz (122.834 kg)  06/27/15 270 lb 3.2 oz (122.562 kg)    Past Medical History  Diagnosis Date  . ASCUS (atypical squamous cells of undetermined significance) on Pap smear     neg HR HPV 09/2008, ascus 06/2009,10/2009  . HSV-2 infection   .  Hypercholesteremia   . Hypertension   . OSA on CPAP   . Anemia   . Allergy   . Prediabetes   . Anxiety      Allergies  Allergen Reactions  . Doxycycline Hives and Shortness Of Breath  . Ace Inhibitors     cough  . Adhesive [Tape]   . Amoxicillin-Pot Clavulanate Nausea Only    diarrhea  . Clindamycin/Lincomycin   . Vicodin [Hydrocodone-Acetaminophen] Itching   Family History  Problem Relation Age of Onset  . Hypertension Maternal Grandmother   . Breast cancer Maternal Grandmother 80  . Emphysema Maternal Grandmother   . Hypertension Mother   . Hyperlipidemia Mother   . Cancer Mother     Skin     Current Outpatient Prescriptions on File Prior to Visit  Medication Sig Dispense Refill  . ALPRAZolam (XANAX) 0.5 MG tablet TAKE 1 TABLET BY MOUTH TWICE DAILY AS NEEDED WHILE FLYING 60 tablet 0  . Cyanocobalamin (VITAMIN B-12 PO) Take 1,000 mcg by mouth daily. Reported on 05/28/2015    . escitalopram (LEXAPRO) 10 MG tablet TAKE 1 TABLET (10 MG TOTAL) BY MOUTH DAILY. 90 tablet 1  . etonogestrel (NEXPLANON) 68 MG IMPL implant 1 each by Subdermal route once.    . fluconazole (DIFLUCAN) 150 MG tablet Take 1 tablet (150 mg total) by mouth once. 1 tablet 3  . IRON PO Take by mouth daily. Reported on 05/28/2015    . MAGNESIUM PO Take by mouth daily. Reported on 06/27/2015    . rosuvastatin (CRESTOR) 10 MG  tablet Take 1 tablet (10 mg total) by mouth daily. (Patient taking differently: Take 10 mg by mouth. Twice weekly) 30 tablet 3  . valACYclovir (VALTREX) 500 MG tablet Take 1 tablet (500 mg total) by mouth daily. 30 tablet 5  . valsartan (DIOVAN) 160 MG tablet TAKE 1/2 TO 1 (ONE-HALF TO ONE) TABLET BY MOUTH DAILY FOR BLOOD PRESSURE 30 tablet 2  . Vitamin D, Ergocalciferol, (DRISDOL) 50000 units CAPS capsule TAKE ONE CAPSULE BY MOUTH THREE TIMES A WEEK 12 capsule PRN   No current facility-administered medications on file prior to visit.    ROS: all negative except above.   Physical  Exam: Filed Weights   08/08/15 0929  Weight: 261 lb 12.8 oz (118.752 kg)   BP 130/82 mmHg  Pulse 76  Temp(Src) 97.9 F (36.6 C) (Temporal)  Resp 16  Ht 5\' 5"  (1.651 m)  Wt 261 lb 12.8 oz (118.752 kg)  BMI 43.57 kg/m2  SpO2 97% General Appearance: Well nourished, in no apparent distress. Eyes: PERRLA, EOMs, conjunctiva no swelling or erythema Sinuses: No Frontal/maxillary tenderness ENT/Mouth: Ext aud canals clear, TMs without erythema, bulging. No erythema, swelling, or exudate on post pharynx.  Tonsils not swollen or erythematous. Hearing normal.  Neck: Supple, thyroid normal.  Respiratory: Respiratory effort normal, BS equal bilaterally without rales, rhonchi, wheezing or stridor.  Cardio: RRR with no MRGs. Brisk peripheral pulses without edema.  Abdomen: Soft, + BS.  Non tender, no guarding, rebound, hernias, masses. Lymphatics: Non tender without lymphadenopathy.  Musculoskeletal: Full ROM, 5/5 strength, normal gait.  Skin: Warm, dry without rashes, lesions, ecchymosis.  Neuro: Cranial nerves intact. Normal muscle tone, no cerebellar symptoms. Sensation intact.  Psych: Awake and oriented X 3, normal affect, Insight and Judgment appropriate.     Vicie Mutters, PA-C 9:44 AM [facility]

## 2015-08-09 ENCOUNTER — Encounter: Payer: Self-pay | Admitting: Physician Assistant

## 2015-08-09 LAB — BASIC METABOLIC PANEL WITH GFR
BUN: 15 mg/dL (ref 7–25)
CO2: 21 mmol/L (ref 20–31)
Calcium: 9.4 mg/dL (ref 8.6–10.2)
Chloride: 108 mmol/L (ref 98–110)
Creat: 0.87 mg/dL (ref 0.50–1.10)
GFR, Est African American: >89 mL/min (ref >=60)
GFR, Est Non African American: 78 mL/min (ref >=60)
Glucose, Bld: 100 mg/dL — ABNORMAL HIGH (ref 65–99)
Potassium: 4.3 mmol/L (ref 3.5–5.3)
Sodium: 137 mmol/L (ref 135–146)

## 2015-08-09 LAB — LIPID PANEL
Cholesterol: 195 mg/dL (ref 125–200)
HDL: 40 mg/dL — ABNORMAL LOW (ref >=46)
LDL Cholesterol: 126 mg/dL (ref <130)
Total CHOL/HDL Ratio: 4.9 Ratio (ref <=5.0)
Triglycerides: 146 mg/dL (ref <150)
VLDL: 29 mg/dL (ref <30)

## 2015-08-09 LAB — HEPATIC FUNCTION PANEL
ALT: 24 U/L (ref 6–29)
AST: 18 U/L (ref 10–35)
Albumin: 4.5 g/dL (ref 3.6–5.1)
Alkaline Phosphatase: 74 U/L (ref 33–115)
Bilirubin, Direct: 0.1 mg/dL (ref <=0.2)
Indirect Bilirubin: 0.5 mg/dL (ref 0.2–1.2)
Total Bilirubin: 0.6 mg/dL (ref 0.2–1.2)
Total Protein: 7.3 g/dL (ref 6.1–8.1)

## 2015-08-09 LAB — BRAIN NATRIURETIC PEPTIDE: Brain Natriuretic Peptide: 19.7 pg/mL (ref <100)

## 2015-08-12 ENCOUNTER — Encounter: Payer: Self-pay | Admitting: Pulmonary Disease

## 2015-08-12 ENCOUNTER — Ambulatory Visit (INDEPENDENT_AMBULATORY_CARE_PROVIDER_SITE_OTHER): Payer: BLUE CROSS/BLUE SHIELD | Admitting: Pulmonary Disease

## 2015-08-12 VITALS — BP 128/70 | HR 69 | Ht 65.0 in | Wt 259.2 lb

## 2015-08-12 DIAGNOSIS — R0600 Dyspnea, unspecified: Secondary | ICD-10-CM

## 2015-08-12 DIAGNOSIS — Z9989 Dependence on other enabling machines and devices: Secondary | ICD-10-CM

## 2015-08-12 DIAGNOSIS — G4733 Obstructive sleep apnea (adult) (pediatric): Secondary | ICD-10-CM | POA: Diagnosis not present

## 2015-08-12 NOTE — Progress Notes (Signed)
Subjective:    Patient ID: Wendy Levine, female    DOB: [birth year], 50 y.o.   MRN: [id number]  C.C.:  Follow-up for Dyspnea, Atypical Chest Pain, & OSA.  HPI Dyspnea: Pulmonary function testing and 6 minute walk test were normal. She reports the dyspnea has improved slightly. She is doing biking, swimming, & walking on a daily basis for exercise & weight loss. Reports she has no dyspnea at all with swimming or walking. She does have dyspnea when biking up hills.   Atypical Chest Pain: CK-MB and BNP were normal. Denies any chest pain or pressure recently. She has deferred a cardiology evaluation for now.   OSA: Currently on CPAP therapy. Reports compliance. She reports her machine is not working well.   Review of Systems No fever, chills, or sweats. No nausea, emesis or abdominal pain. No rashes or bruising.   Allergies  Allergen Reactions  . Doxycycline Hives and Shortness Of Breath  . Ace Inhibitors     cough  . Adhesive [Tape]   . Amoxicillin-Pot Clavulanate Nausea Only    diarrhea  . Clindamycin/Lincomycin   . Vicodin [Hydrocodone-Acetaminophen] Itching    Current Outpatient Prescriptions on File Prior to Visit  Medication Sig Dispense Refill  . Acetaminophen (TYLENOL ARTHRITIS PAIN PO) Take by mouth.    . ALPRAZolam (XANAX) 0.5 MG tablet TAKE 1 TABLET BY MOUTH TWICE DAILY AS NEEDED WHILE FLYING 60 tablet 0  . Cyanocobalamin (VITAMIN B-12 PO) Take 1,000 mcg by mouth daily. Reported on 05/28/2015    . escitalopram (LEXAPRO) 10 MG tablet TAKE 1 TABLET (10 MG TOTAL) BY MOUTH DAILY. 90 tablet 1  . etonogestrel (NEXPLANON) 68 MG IMPL implant 1 each by Subdermal route once.    . IRON PO Take by mouth daily. Reported on 05/28/2015    . MAGNESIUM PO Take by mouth daily. Reported on 06/27/2015    . rosuvastatin (CRESTOR) 10 MG tablet Take 1 tablet (10 mg total) by mouth daily. (Patient taking differently: Take 10 mg by mouth. Twice weekly) 30 tablet 3  . valACYclovir (VALTREX) 500  MG tablet Take 1 tablet (500 mg total) by mouth daily. 30 tablet 5  . valsartan (DIOVAN) 160 MG tablet TAKE 1/2 TO 1 (ONE-HALF TO ONE) TABLET BY MOUTH DAILY FOR BLOOD PRESSURE 30 tablet 2  . Vitamin D, Ergocalciferol, (DRISDOL) 50000 units CAPS capsule TAKE ONE CAPSULE BY MOUTH THREE TIMES A WEEK 12 capsule PRN  . fluconazole (DIFLUCAN) 150 MG tablet Take 1 tablet (150 mg total) by mouth once. (Patient not taking: Reported on 08/12/2015) 1 tablet 3   No current facility-administered medications on file prior to visit.    Past Medical History  Diagnosis Date  . ASCUS (atypical squamous cells of undetermined significance) on Pap smear     neg HR HPV 09/2008, ascus 06/2009,10/2009  . HSV-2 infection   . Hypercholesteremia   . Hypertension   . OSA on CPAP   . Anemia   . Allergy   . Prediabetes   . Anxiety     Past Surgical History  Procedure Laterality Date  . Cesarean section    . Remove moles    . Colposcopy    . Knee surgery      Arthroscopic  . Mouth surgery    . Nexplanon insertion  05/22/2015    Family History  Problem Relation Age of Onset  . Hypertension Maternal Grandmother   . Breast cancer Maternal Grandmother 80  . Emphysema Maternal Grandmother   .  Hypertension Mother   . Hyperlipidemia Mother   . Cancer Mother     Skin    Social History   Social History  . Marital Status: Married    Spouse Name: N/A  . Number of Children: N/A  . 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 Smoking status: Former Smoker -- 0.30 packs/day for 10 years    Types: Cigarettes    Quit date: 06/08/1999  . Smokeless tobacco: Never Used  . Alcohol Use: 4.2 oz/week    7 Standard drinks or equivalent per week     Comment: 3 x weekly  . Drug Use: No  . Sexual Activity:    Partners: Male    Birth Control/ Protection: Condom     Comment: Nexplanon 05/22/2015 1st intercourse 16 yo-5 partners   Other Topics Concern  .  None   Social History Narrative   Originally from [place]. Has lived in [place] as well. Previously has traveled to most of the 64 states as well as [place]. Has prior travel to [place]. Previously has worked in Personal assistant and also with veterinarians. Has dogs and cats at home. No bird exposure. No mold exposure. Does have a hot tub/pool combination.       Objective:   Physical Exam BP 128/70 mmHg  Pulse 69  Ht 5\' 5"  (1.651 m)  Wt 259 lb 3.2 oz (117.572 kg)  BMI 43.13 kg/m2  SpO2 98% General:  Awake. Alert. No distress. Obese. Integument:  Warm & dry. No rash on exposed skin.  Lymphatics:  No appreciated cervical or supraclavicular lymphadenoapthy. HEENT:  Moist mucus membranes. No oral ulcers. No scleral injection. Minimal nasal turbinate swelling. Cardiovascular:  Regular rate. No edema. Normal S1 & S2. Pulmonary:  Clear bilaterally to auscultation. Normal work of breathing on room air. Speaking in complete sentences.  Abdomen: Soft. Normal bowel sounds. Nnontender.  PFT 07/01/15: FVC 3.25 L (80%) FEV1 2.60 L (89%) FEV1/FVC 0.80 FEF 25-75 2.41 L (84%) no bronchodilator response TLC 5.30 L (101%) ERV 10% DLCO uncorrected 87% (hemoglobin 13.3)  6MWT 07/31/15: Walked 528 meters / Baseline Sat 98% on RA / Nadir Sat 95% on RA  IMAGING CXR PA/LAT 05/29/15 (previously reviewed by me):  No focal opacity or appreciated. Heart normal in size mediastinum normal in contour.  CARDIAC  EKG 06/27/15 (previously reviewed by me): Normal sinus rhythm. Inverted T waves in lead V1 & V2. No ST segment elevation or depression suggestive of ischemia.  TTE (6/25/7):  LV nomrla in size & EF 55%. No regional wall motion abnormalities. LA & RA normal in size. RV normal in size & function. No aortic stenosis or regurg. No mitral regurg. Trivial tricuspid regurg. No pericardial effusion.   LABS 08/08/15 BNP:  19.7  06/27/15 CK-MB:  1.2  05/28/15 CBC: 6.7/12.8/39.5/212 BMP:  138/3.8/107/24/12/0.82/90/8.6 LFT: 4.4/6.8/0.6/66/14/14 D-dimer: 0.27    Assessment & Plan:  50 year old female presenting with improving dyspnea and resolution of her atypical chest pain. I reviewed her 6 minute walk test as well as her pulmonary function testing with her today. Her spirometry, lung volumes, and carbon dioxide diffusion capacity were all normal. She had no significant desaturation during her 6 minute walk test. I suspect her dyspnea is due to physical deconditioning. We did discuss potentially doing cardio pulmonary exercise testing and CT imaging of her lungs but I would defer both of these tests at this time unless  the patient develops worsening symptoms or new symptoms before her next appointment.  1. Dyspnea: Improving. Likely due to physical deconditioning. 2. Atypical Chest Pain: Resolved. 3. OSA: Continuing on CPAP therapy indefinitely. 4. Follow-up:  Return to clinic in 6 months or sooner if needed. The patient has complete resolution of symptoms instructed her she could cancel this appointment and just follow-up as needed.  Sonia Baller Ashok Cordia, M.D. [facility] Pager:  [phone] After 3pm or if no response, call [phone] 10:37 AM 08/12/2015

## 2015-08-12 NOTE — Patient Instructions (Signed)
   Call me if you have any new breathing problems before your next appointment.  If you feel your shortness of breath has totally resolved you can cancel your follow-up appointment in 6 months and contact my office if I can help you in the further.

## 2015-08-21 ENCOUNTER — Other Ambulatory Visit: Payer: Self-pay | Admitting: Gynecology

## 2015-08-21 DIAGNOSIS — Z1231 Encounter for screening mammogram for malignant neoplasm of breast: Secondary | ICD-10-CM

## 2015-10-02 ENCOUNTER — Ambulatory Visit
Admission: RE | Admit: 2015-10-02 | Discharge: 2015-10-02 | Disposition: A | Discharge status: Home / Self Care | Payer: BLUE CROSS/BLUE SHIELD | Source: Ambulatory Visit | Attending: Gynecology | Admitting: Gynecology

## 2015-10-02 DIAGNOSIS — Z1231 Encounter for screening mammogram for malignant neoplasm of breast: Secondary | ICD-10-CM

## 2015-10-21 ENCOUNTER — Encounter: Payer: Self-pay | Admitting: Physician Assistant

## 2015-10-21 ENCOUNTER — Ambulatory Visit (INDEPENDENT_AMBULATORY_CARE_PROVIDER_SITE_OTHER): Payer: BLUE CROSS/BLUE SHIELD | Admitting: Physician Assistant

## 2015-10-21 VITALS — BP 132/80 | HR 63 | Temp 97.3°F | Resp 14 | Ht 65.0 in | Wt 248.0 lb

## 2015-10-21 DIAGNOSIS — M542 Cervicalgia: Secondary | ICD-10-CM

## 2015-10-21 DIAGNOSIS — F411 Generalized anxiety disorder: Secondary | ICD-10-CM

## 2015-10-21 DIAGNOSIS — M26609 Unspecified temporomandibular joint disorder, unspecified side: Secondary | ICD-10-CM

## 2015-10-21 MED ORDER — CYCLOBENZAPRINE HCL 10 MG PO TABS: 10.0000 mg | ORAL_TABLET | Freq: Three times a day (TID) | ORAL | 0 refills | Status: DC | PRN

## 2015-10-21 MED ORDER — ESCITALOPRAM OXALATE 20 MG PO TABS: 20.0000 mg | ORAL_TABLET | Freq: Every day | ORAL | 2 refills | Status: DC

## 2015-10-21 NOTE — Patient Instructions (Signed)
What is the TMJ? The temporomandibular (tem-PUH-ro-man-DIB-yoo-ler) joint, or the TMJ, connects the upper and lower jawbones. This joint allows the jaw to open wide and move back and forth when you chew, talk, or yawn.There are also several muscles that help this joint move. There can be muscle tightness and pain in the muscle that can cause several symptoms.  What causes TMJ pain? There are many causes of TMJ pain. Repeated chewing (for example, chewing gum) and clenching your teeth can cause pain in the joint. Some TMJ pain has no obvious cause. What can I do to ease the pain? There are many things you can do to help your pain get better. When you have pain:  Eat soft foods and stay away from chewy foods (for example, taffy) Try to use both sides of your mouth to chew Don't chew gum Massage Don't open your mouth wide (for example, during yawning or singing) Don't bite your cheeks or fingernails Lower your amount of stress and worry Applying a warm, damp washcloth to the joint may help. Over-the-counter pain medicines such as ibuprofen (one brand: Advil) or acetaminophen (one brand: Tylenol) might also help. Do not use these medicines if you are allergic to them or if your doctor told you not to use them. How can I stop the pain from coming back? When your pain is better, you can do these exercises to make your muscles stronger and to keep the pain from coming back:  Resisted mouth opening: Place your thumb or two fingers under your chin and open your mouth slowly, pushing up lightly on your chin with your thumb. Hold for three to six seconds. Close your mouth slowly. Resisted mouth closing: Place your thumbs under your chin and your two index fingers on the ridge between your mouth and the bottom of your chin. Push down lightly on your chin as you close your mouth. Tongue up: Slowly open and close your mouth while keeping the tongue touching the roof of the mouth. Side-to-side jaw movement:  Place an object about one fourth of an inch thick (for example, two tongue depressors) between your front teeth. Slowly move your jaw from side to side. Increase the thickness of the object as the exercise becomes easier Forward jaw movement: Place an object about one fourth of an inch thick between your front teeth and move the bottom jaw forward so that the bottom teeth are in front of the top teeth. Increase the thickness of the object as the exercise becomes easier. These exercises should not be painful. If it hurts to do these exercises, stop doing them and talk to your family doctor.    Cervical Sprain A cervical sprain is an injury in the neck in which the strong, fibrous tissues (ligaments) that connect your neck bones stretch or tear. Cervical sprains can range from mild to severe. Severe cervical sprains can cause the neck vertebrae to be unstable. This can lead to damage of the spinal cord and can result in serious nervous system problems. The amount of time it takes for a cervical sprain to get better depends on the cause and extent of the injury. Most cervical sprains heal in 1 to 3 weeks. CAUSES  Severe cervical sprains may be caused by:   Contact sport injuries (such as from football, rugby, wrestling, hockey, auto racing, gymnastics, diving, martial arts, or boxing).   Motor vehicle collisions.   Whiplash injuries. This is an injury from a sudden forward and backward whipping movement of  the head and neck.  Falls.  Mild cervical sprains may be caused by:   Being in an awkward position, such as while cradling a telephone between your ear and shoulder.   Sitting in a chair that does not offer proper support.   Working at a poorly Landscape architect station.   Looking up or down for long periods of time.  SYMPTOMS   Pain, soreness, stiffness, or a burning sensation in the front, back, or sides of the neck. This discomfort may develop immediately after the injury or  slowly, 24 hours or more after the injury.   Pain or tenderness directly in the middle of the back of the neck.   Shoulder or upper back pain.   Limited ability to move the neck.   Headache.   Dizziness.   Weakness, numbness, or tingling in the hands or arms.   Muscle spasms.   Difficulty swallowing or chewing.   Tenderness and swelling of the neck.  DIAGNOSIS  Most of the time your health care provider can diagnose a cervical sprain by taking your history and doing a physical exam. Your health care provider will ask about previous neck injuries and any known neck problems, such as arthritis in the neck. X-rays may be taken to find out if there are any other problems, such as with the bones of the neck. Other tests, such as a CT scan or MRI, may also be needed.  TREATMENT  Treatment depends on the severity of the cervical sprain. Mild sprains can be treated with rest, keeping the neck in place (immobilization), and pain medicines. Severe cervical sprains are immediately immobilized. Further treatment is done to help with pain, muscle spasms, and other symptoms and may include:  Medicines, such as pain relievers, numbing medicines, or muscle relaxants.   Physical therapy. This may involve stretching exercises, strengthening exercises, and posture training. Exercises and improved posture can help stabilize the neck, strengthen muscles, and help stop symptoms from returning.  HOME CARE INSTRUCTIONS   Put ice on the injured area.   Put ice in a plastic bag.   Place a towel between your skin and the bag.   Leave the ice on for 15-20 minutes, 3-4 times a day.   If your injury was severe, you may have been given a cervical collar to wear. A cervical collar is a two-piece collar designed to keep your neck from moving while it heals.  Do not remove the collar unless instructed by your health care provider.  If you have long hair, keep it outside of the collar.  Ask  your health care provider before making any adjustments to your collar. Minor adjustments may be required over time to improve comfort and reduce pressure on your chin or on the back of your head.  Ifyou are allowed to remove the collar for cleaning or bathing, follow your health care provider's instructions on how to do so safely.  Keep your collar clean by wiping it with mild soap and water and drying it completely. If the collar you have been given includes removable pads, remove them every 1-2 days and hand wash them with soap and water. Allow them to air dry. They should be completely dry before you wear them in the collar.  If you are allowed to remove the collar for cleaning and bathing, wash and dry the skin of your neck. Check your skin for irritation or sores. If you see any, tell your health care provider.  Do not  drive while wearing the collar.   Only take over-the-counter or prescription medicines for pain, discomfort, or fever as directed by your health care provider.   Keep all follow-up appointments as directed by your health care provider.   Keep all physical therapy appointments as directed by your health care provider.   Make any needed adjustments to your workstation to promote good posture.   Avoid positions and activities that make your symptoms worse.   Warm up and stretch before being active to help prevent problems.  SEEK MEDICAL CARE IF:   Your pain is not controlled with medicine.   You are unable to decrease your pain medicine over time as planned.   Your activity level is not improving as expected.  SEEK IMMEDIATE MEDICAL CARE IF:   You develop any bleeding.  You develop stomach upset.  You have signs of an allergic reaction to your medicine.   Your symptoms get worse.   You develop new, unexplained symptoms.   You have numbness, tingling, weakness, or paralysis in any part of your body.  MAKE SURE YOU:   Understand these  instructions.  Will watch your condition.  Will get help right away if you are not doing well or get worse.   This information is not intended to replace advice given to you by your health care provider. Make sure you discuss any questions you have with your health care provider.   Document Released: 12/07/2006 Document Revised: 02/14/2013 Document Reviewed: 08/17/2012 Elsevier Interactive Patient Education 2016 [employer]   Parotitis Parotitis is soreness and inflammation of one or both parotid glands. The parotid glands produce saliva. They are located on each side of the face, below and in front of the earlobes. The saliva produced comes out of tiny openings (ducts) inside the cheeks. In most cases, parotitis goes away over time or with treatment. If your parotitis is caused by certain long-term (chronic) diseases, it may come back again.  CAUSES  Parotitis can be caused by:  Viral infections. Mumps is one viral infection that can cause parotitis.  Bacterial infections.  Blockage of the salivary ducts due to a salivary stone.  Narrowing of the salivary ducts.  Swelling of the salivary ducts.  Dehydration.  Autoimmune conditions, such as sarcoidosis or Sjogren syndrome.  Air from activities such as scuba diving, glass blowing, or playing an instrument (rare).  Human immunodeficiency virus (HIV) or acquired immunodeficiency syndrome (AIDS).  Tuberculosis. SIGNS AND SYMPTOMS   The ears may appear to be pushed up and out from their normal position.  Redness (erythema) of the skin over the parotid glands.  Pain and tenderness over the parotid glands.  Swelling in the parotid gland area.  Yellowish-white fluid (pus) coming from the ducts inside the cheeks.  Dry mouth.  Bad taste in the mouth. DIAGNOSIS  Your health care provider may determine that you have parotitis based on your symptoms and a physical exam. A sample of fluid may also be taken from the parotid  gland and tested to find the cause of your infection. X-rays or computed tomography (CT) scans may be taken if your health care provider thinks you might have a salivary stone blocking your salivary duct. TREATMENT  Treatment varies depending upon the cause of your parotitis. If your parotitis is caused by mumps, no treatment is needed. The condition will go away on its own after 7 to 10 days. In other cases, treatment may include:  Antibiotic medicine if your infection was caused by  bacteria.  Pain medicines.  Gland massage.  Eating sour candy to increase your saliva production.  Removal of salivary stones. Your health care provider may flush stones out with fluids or remove them with tweezers.  Surgery to remove the parotid glands. HOME CARE INSTRUCTIONS   If you were prescribed an antibiotic medicine, finish it all even if you start to feel better.  Put warm compresses on the sore area.  Take medicines only as directed by your health care provider.  Drink enough fluids to keep your urine clear or pale yellow. SEEK IMMEDIATE MEDICAL CARE IF:   You have increasing pain or swelling that is not controlled with medicine.  You have a fever. MAKE SURE YOU:  Understand these instructions.  Will watch your condition.  Will get help right away if you are not doing well or get worse.   This information is not intended to replace advice given to you by your health care provider. Make sure you discuss any questions you have with your health care provider.   Document Released: 08/01/2001 Document Revised: 03/02/2014 Document Reviewed: 07/05/2014 Elsevier Interactive Patient Education [employer].

## 2015-10-21 NOTE — Progress Notes (Signed)
Subjective:    Patient ID: Wendy Levine, female    DOB: 04-Nov-1965, 50 y.o.   MRN: [id number]  HPI 50 y.o. WF presents with neck pain x July with other multitude of symptoms. She had been exercising a lot, worse neck pain with breast stroke in the pool and golfing. Started right shoulder but is now bilateral shoulders and neck, some pain on top of her head, she has some bilateral hand numbness worse with riding bike, worse with right hand, and has some pain bilateral 4th fingers, xanax has helped her sleep, aleve and etc has not helped.  Saw counselor today and took half a vicodin from previous dental surgery that helped some. She is under a lot of stress. She states she gets cold easily, has been having chills in her chest. She has been having nausea with eating and having abnormal salty taste in her mouth. Started marriage counseling in May.   Blood pressure 132/80, pulse 63, temperature 97.3 F (36.3 C), resp. rate 14, height 5\' 5"  (1.651 m), weight 248 lb (112.5 kg), SpO2 97 %.   Medications Current Outpatient Prescriptions on File Prior to Visit  Medication Sig  . ALPRAZolam (XANAX) 0.5 MG tablet TAKE 1 TABLET BY MOUTH TWICE DAILY AS NEEDED WHILE FLYING  . Cyanocobalamin (VITAMIN B-12 PO) Take 1,000 mcg by mouth daily. Reported on 05/28/2015  . escitalopram (LEXAPRO) 10 MG tablet TAKE 1 TABLET (10 MG TOTAL) BY MOUTH DAILY.  Marland Kitchen etonogestrel (NEXPLANON) 68 MG IMPL implant 1 each by Subdermal route once.  . IRON PO Take by mouth daily. Reported on 05/28/2015  . MAGNESIUM PO Take by mouth daily. Reported on 06/27/2015  . rosuvastatin (CRESTOR) 10 MG tablet Take 1 tablet (10 mg total) by mouth daily. (Patient taking differently: Take 10 mg by mouth. Twice weekly)  . valACYclovir (VALTREX) 500 MG tablet Take 1 tablet (500 mg total) by mouth daily.  . valsartan (DIOVAN) 160 MG tablet TAKE 1/2 TO 1 (ONE-HALF TO ONE) TABLET BY MOUTH DAILY FOR BLOOD PRESSURE  . Vitamin D, Ergocalciferol,  (DRISDOL) 50000 units CAPS capsule TAKE ONE CAPSULE BY MOUTH THREE TIMES A WEEK   No current facility-administered medications on file prior to visit.     Problem list She has Hypercholesteremia; Hypertension; HSV-2 infection; ASCUS favoring benign; Pes planus of both feet; Primary localized osteoarthrosis, lower leg; Obesity; Insulin resistance; OSA (obstructive sleep apnea); Vitamin D deficiency; Medication management; Anemia; Medial tibial stress syndrome; Sesamoiditis; Morton's neuroma of left foot; Carpal tunnel syndrome, bilateral; and Dyspnea on her problem list.   Review of Systems  Constitutional: Negative.   HENT: Negative.        Abnormal taste, dry mouth  Respiratory: Negative.   Cardiovascular: Negative.   Gastrointestinal: Positive for nausea. Negative for abdominal pain, diarrhea and vomiting.  Genitourinary: Negative.   Musculoskeletal: Negative.   Skin: Negative.   Neurological: Negative.   Psychiatric/Behavioral: Positive for dysphoric mood. The patient is nervous/anxious.        Objective:   Physical Exam  Constitutional: She is oriented to person, place, and time. She appears well-developed and well-nourished.  HENT:  Right Ear: Hearing, tympanic membrane, external ear and ear canal normal.  Left Ear: Hearing, tympanic membrane, external ear and ear canal normal.  Nose: Nose normal.  Mouth/Throat: Oropharynx is clear and moist. No oral lesions. No trismus in the jaw. Normal dentition. No dental abscesses or uvula swelling.  Very tender bilateral parotids/TMJ  Neck: Muscular tenderness (SCM) present. No spinous  process tenderness present. No neck rigidity. Decreased range of motion: due to pain. No thyroid mass and no thyromegaly present.  Pulmonary/Chest: Effort normal and breath sounds normal.  Abdominal: Soft. Bowel sounds are normal.  Musculoskeletal:       Cervical back: She exhibits tenderness, pain and spasm. She exhibits no bony tenderness, no swelling  and normal pulse.  Neurological: She is alert and oriented to person, place, and time. No cranial nerve deficit.  Skin: Skin is warm and dry. No rash noted.       Assessment & Plan:  Neck pain- ? From stress, TMJ, impingement right shoulder-  Flexeril, refer ortho need PT for neck/TMJ, heat  Abnormal taste in mouth- ? Parotid stone/inflammation- lemon drops, massage, if not better will get [place]  Anxiety- increase lexapro 20mg , continue xanax prn.   Future Appointments Date Time Provider [place]  11/12/2015 9:30 AM Vicie Mutters, PA-C [facility] None  07/16/2016 10:00 AM Vicie Mutters, PA-C [facility] None

## 2015-10-22 NOTE — Addendum Note (Signed)
Addended by: Vicie Mutters R on: 10/22/2015 01:11 PM   Modules accepted: Orders

## 2015-10-30 ENCOUNTER — Other Ambulatory Visit: Payer: Self-pay | Admitting: Physician Assistant

## 2015-11-12 ENCOUNTER — Encounter: Payer: BLUE CROSS/BLUE SHIELD | Admitting: Physician Assistant

## 2015-11-12 ENCOUNTER — Ambulatory Visit: Payer: Self-pay | Admitting: Physician Assistant

## 2015-11-12 ENCOUNTER — Encounter: Payer: Self-pay | Admitting: Physician Assistant

## 2015-11-13 NOTE — Progress Notes (Signed)
This encounter was created in error - please disregard.

## 2015-12-03 ENCOUNTER — Other Ambulatory Visit: Payer: Self-pay | Admitting: Internal Medicine

## 2015-12-03 DIAGNOSIS — F411 Generalized anxiety disorder: Secondary | ICD-10-CM

## 2015-12-03 NOTE — Telephone Encounter (Signed)
RX CALLED INTO [facility]

## 2015-12-27 ENCOUNTER — Encounter: Payer: Self-pay | Admitting: Family Medicine

## 2015-12-27 ENCOUNTER — Encounter: Payer: Self-pay | Admitting: Physician Assistant

## 2016-03-10 ENCOUNTER — Other Ambulatory Visit: Payer: Self-pay | Admitting: Gynecology

## 2016-03-10 ENCOUNTER — Encounter: Payer: Self-pay | Admitting: Gynecology

## 2016-03-10 ENCOUNTER — Encounter: Payer: Self-pay | Admitting: Physician Assistant

## 2016-03-10 ENCOUNTER — Other Ambulatory Visit: Payer: Self-pay | Admitting: Physician Assistant

## 2016-03-10 MED ORDER — VALACYCLOVIR HCL 500 MG PO TABS: 500.0000 mg | ORAL_TABLET | Freq: Every day | ORAL | 3 refills | Status: DC

## 2016-03-22 ENCOUNTER — Other Ambulatory Visit: Payer: Self-pay | Admitting: Internal Medicine

## 2016-03-23 ENCOUNTER — Encounter: Payer: Self-pay | Admitting: Physician Assistant

## 2016-03-23 ENCOUNTER — Ambulatory Visit (INDEPENDENT_AMBULATORY_CARE_PROVIDER_SITE_OTHER): Payer: BLUE CROSS/BLUE SHIELD | Admitting: Physician Assistant

## 2016-03-23 VITALS — BP 122/70 | HR 59 | Temp 97.7°F | Resp 16 | Ht 65.0 in | Wt 234.2 lb

## 2016-03-23 DIAGNOSIS — E7800 Pure hypercholesterolemia, unspecified: Secondary | ICD-10-CM

## 2016-03-23 DIAGNOSIS — E559 Vitamin D deficiency, unspecified: Secondary | ICD-10-CM | POA: Diagnosis not present

## 2016-03-23 DIAGNOSIS — Z79899 Other long term (current) drug therapy: Secondary | ICD-10-CM

## 2016-03-23 DIAGNOSIS — E6609 Other obesity due to excess calories: Secondary | ICD-10-CM

## 2016-03-23 DIAGNOSIS — E8881 Metabolic syndrome: Secondary | ICD-10-CM | POA: Diagnosis not present

## 2016-03-23 DIAGNOSIS — I10 Essential (primary) hypertension: Secondary | ICD-10-CM | POA: Diagnosis not present

## 2016-03-23 DIAGNOSIS — Z6839 Body mass index (BMI) 39.0-39.9, adult: Secondary | ICD-10-CM

## 2016-03-23 LAB — BASIC METABOLIC PANEL WITH GFR
BUN: 13 mg/dL (ref 7–25)
CO2: 24 mmol/L (ref 20–31)
Calcium: 9.4 mg/dL (ref 8.6–10.2)
Chloride: 104 mmol/L (ref 98–110)
Creat: 0.93 mg/dL (ref 0.50–1.10)
GFR, Est African American: 83 mL/min (ref >=60)
GFR, Est Non African American: 72 mL/min (ref >=60)
Glucose, Bld: 95 mg/dL (ref 65–99)
Potassium: 4.4 mmol/L (ref 3.5–5.3)
Sodium: 140 mmol/L (ref 135–146)

## 2016-03-23 LAB — HEPATIC FUNCTION PANEL
ALT: 13 U/L (ref 6–29)
AST: 13 U/L (ref 10–35)
Albumin: 4.4 g/dL (ref 3.6–5.1)
Alkaline Phosphatase: 70 U/L (ref 33–115)
Bilirubin, Direct: 0.1 mg/dL (ref <=0.2)
Indirect Bilirubin: 0.6 mg/dL (ref 0.2–1.2)
Total Bilirubin: 0.7 mg/dL (ref 0.2–1.2)
Total Protein: 7.3 g/dL (ref 6.1–8.1)

## 2016-03-23 LAB — CBC WITH DIFFERENTIAL/PLATELET
Basophils Absolute: 63 cells/uL (ref 0–200)
Basophils Relative: 1 %
Eosinophils Absolute: 126 cells/uL (ref 15–500)
Eosinophils Relative: 2 %
HCT: 41.1 % (ref 35.0–45.0)
Hemoglobin: 13.5 g/dL (ref 11.7–15.5)
Lymphocytes Relative: 24 %
Lymphs Abs: 1512 cells/uL (ref 850–3900)
MCH: 29.2 pg (ref 27.0–33.0)
MCHC: 32.8 g/dL (ref 32.0–36.0)
MCV: 88.8 fL (ref 80.0–100.0)
MPV: 10.6 fL (ref 7.5–12.5)
Monocytes Absolute: 378 cells/uL (ref 200–950)
Monocytes Relative: 6 %
Neutro Abs: 4221 cells/uL (ref 1500–7800)
Neutrophils Relative %: 67 %
Platelets: 272 10*3/uL (ref 140–400)
RBC: 4.63 MIL/uL (ref 3.80–5.10)
RDW: 14 % (ref 11.0–15.0)
WBC: 6.3 10*3/uL (ref 3.8–10.8)

## 2016-03-23 LAB — LIPID PANEL
Cholesterol: 191 mg/dL (ref <200)
HDL: 44 mg/dL — ABNORMAL LOW (ref >50)
LDL Cholesterol: 120 mg/dL — ABNORMAL HIGH (ref <100)
Total CHOL/HDL Ratio: 4.3 Ratio (ref <5.0)
Triglycerides: 135 mg/dL (ref <150)
VLDL: 27 mg/dL (ref <30)

## 2016-03-23 LAB — HEMOGLOBIN A1C
Hgb A1c MFr Bld: 5.1 % (ref <5.7)
Mean Plasma Glucose: 100 mg/dL

## 2016-03-23 LAB — TSH: TSH: 2.04 mIU/L

## 2016-03-23 NOTE — Progress Notes (Signed)
Assessment and Plan:   Hypertension - monitor blood pressure at home. Continue DASH diet.  Reminder to go to the ER if any CP, SOB, nausea, dizziness, severe HA, changes vision/speech, left arm numbness and tingling and jaw pain.  Cholesterol -Continue diet and exercise. Check cholesterol.    Prediabetes  -Continue diet and exercise. Check A1C  Vitamin D Def - check level and continue medications.   Obesity with co morbidities - long discussion about weight loss, diet, and exercise   Continue diet and meds as discussed. Further disposition pending results of labs. Over 30 minutes of exam, counseling, chart review, and critical decision making was performed  Future Appointments Date Time Provider [place]  07/09/2016 10:00 AM Vicie Mutters, PA-C [facility] None     HPI 51 y.o. female  presents for 3 month follow up on hypertension, cholesterol, prediabetes, obesity, and vitamin D deficiency.   Her blood pressure has been controlled at home, she has been off the HCTZ due to advise from [place] , today their BP is BP: 122/70,  She has been following with ortho for right shoulder.  She is on lexapro 10mg  and doing okay, seeing therapist.   She does not workout. She denies chest pain, dizziness, SOB.  She is on cholesterol medication, crestor 10mg  2 x a week and denies myalgias. Her cholesterol is at goal. The cholesterol last visit was:   Lab Results  Component Value Date   CHOL 195 08/08/2015   HDL 40 (L) 08/08/2015   LDLCALC 126 08/08/2015   TRIG 146 08/08/2015   CHOLHDL 4.9 08/08/2015    She has been working on diet and exercise for prediabetes with insulin resistance due to obesity, and denies paresthesia of the feet, polydipsia, polyuria and visual disturbances. Last A1C in the office was:  Lab Results  Component Value Date   HGBA1C 5.7 (H) 07/09/2015   Patient is on Vitamin D supplement.   Lab Results  Component Value Date   VD25OH 62.23 03/07/2015      BMI is Body mass index is 38.97 kg/m., she is working on diet and exercise. Wt Readings from Last 3 Encounters:  03/23/16 234 lb 3.2 oz (106.2 kg)  11/12/15 241 lb 12.8 oz (109.7 kg)  10/21/15 248 lb (112.5 kg)    Current Medications:  Current Outpatient Prescriptions on File Prior to Visit  Medication Sig Dispense Refill  . ALPRAZolam (XANAX) 0.5 MG tablet TAKE 1 TABLET BY MOUTH TWICE DAILY AS NEEDED WHILE FLYING 60 tablet 0  . escitalopram (LEXAPRO) 10 MG tablet TAKE ONE TABLET BY MOUTH DAILY. 30 tablet 0  . escitalopram (LEXAPRO) 20 MG tablet Take 1 tablet (20 mg total) by mouth daily. 30 tablet 2  . etonogestrel (NEXPLANON) 68 MG IMPL implant 1 each by Subdermal route once.    . IRON PO Take by mouth daily. Reported on 05/28/2015    . MAGNESIUM PO Take by mouth daily. Reported on 06/27/2015    . rosuvastatin (CRESTOR) 10 MG tablet Take 1 tablet (10 mg total) by mouth daily. (Patient taking differently: Take 10 mg by mouth. Twice weekly) 30 tablet 3  . valACYclovir (VALTREX) 500 MG tablet Take 1 tablet (500 mg total) by mouth daily. 30 tablet 3  . valsartan (DIOVAN) 160 MG tablet TAKE ONE-HALF TO ONE TABLET BY MOUTH DAILY FOR FOR BLOOD PRESSURE 30 tablet 0  . Vitamin D, Ergocalciferol, (DRISDOL) 50000 units CAPS capsule TAKE ONE CAPSULE BY MOUTH THREE TIMES A WEEK 12 capsule PRN  No current facility-administered medications on file prior to visit.    Medical History:  Past Medical History:  Diagnosis Date  . Allergy   . Anemia   . Anxiety   . ASCUS (atypical squamous cells of undetermined significance) on Pap smear    neg HR HPV 09/2008, ascus 06/2009,10/2009  . HSV-2 infection   . Hypercholesteremia   . Hypertension   . OSA on CPAP   . Prediabetes    Allergies:  Allergies  Allergen Reactions  . Doxycycline Hives and Shortness Of Breath  . Ace Inhibitors     cough  . Adhesive [Tape]   . Amoxicillin-Pot Clavulanate Nausea Only    diarrhea  . Clindamycin/Lincomycin   .  Vicodin [Hydrocodone-Acetaminophen] Itching    Review of Systems:  Review of Systems  Constitutional: Positive for malaise/fatigue. Negative for chills, diaphoresis, fever and weight loss.  HENT: Negative.   Eyes: Negative.   Respiratory: Negative for cough, hemoptysis, sputum production, shortness of breath and wheezing.   Cardiovascular: Negative.  Negative for chest pain, palpitations, orthopnea, claudication, leg swelling and PND.  Gastrointestinal: Negative.   Genitourinary: Negative.   Musculoskeletal: Negative for back pain, falls, joint pain, myalgias and neck pain.  Skin: Negative.   Neurological: Negative.  Negative for weakness.  Psychiatric/Behavioral: Negative.     Family history- Review and unchanged Social history- Review and unchanged Physical Exam: BP 122/70   Pulse (!) 59   Temp 97.7 F (36.5 C)   Resp 16   Ht 5\' 5"  (1.651 m)   Wt 234 lb 3.2 oz (106.2 kg)   SpO2 98%   BMI 38.97 kg/m  Wt Readings from Last 3 Encounters:  03/23/16 234 lb 3.2 oz (106.2 kg)  11/12/15 241 lb 12.8 oz (109.7 kg)  10/21/15 248 lb (112.5 kg)   General Appearance: Well nourished, in no apparent distress. Eyes: PERRLA, EOMs, conjunctiva no swelling or erythema Sinuses: No Frontal/maxillary tenderness ENT/Mouth: Ext aud canals clear, TMs without erythema, bulging. No erythema, swelling, or exudate on post pharynx.  Tonsils not swollen or erythematous. Hearing normal.  Neck: Supple, thyroid normal.  Respiratory: Respiratory effort normal, BS equal bilaterally without rales, rhonchi, wheezing or stridor.  Cardio: RRR with no MRGs. Brisk peripheral pulses without edema.  Abdomen: Soft, + BS,  Non tender, no guarding, rebound, hernias, masses. Lymphatics: Non tender without lymphadenopathy.  Musculoskeletal: Full ROM, 5/5 strength, Normal gait Skin: Warm, dry without rashes, lesions, ecchymosis.  Neuro: Cranial nerves intact. Normal muscle tone, no cerebellar symptoms. Psych: Awake  and oriented X 3, normal affect, Insight and Judgment appropriate.    Vicie Mutters, PA-C 10:06 AM [facility]

## 2016-03-23 NOTE — Patient Instructions (Signed)
Simple math prevails.    1st - exercise does not produce significant weight loss - at best one converts fat into muscle , "bulks up", loses inches, but usually stays "weight neutral"     2nd - think of your body weightas a check book: If you eat more calories than you burn up - you save money or gain weight .... Or if you spend more money than you put in the check book, ie burn up more calories than you eat, then you lose weight     3rd - if you walk or run 1 mile, you burn up 100 calories - you have to burn up 3,500 calories to lose 1 pound, ie you have to walk/run 35 miles to lose 1 measly pound. So if you want to lose 10 #, then you have to walk/run 350 miles, so.... clearly exercise is not the solution.     4. So if you consume 1,500 calories, then you have to burn up the equivalent of 15 miles to stay weight neutral - It also stands to reason that if you consume 1,500 cal/day and don't lose weight, then you must be burning up about 1,500 cals/day to stay weight neutral.     5. If you really want to lose weight, you must cut your calorie intake 300 calories /day and at that rate you should lose about 1 # every 3 days.   6. Please purchase Wendy Levine's book(s) "The End of Dieting" & "Eat to Live" . It has some great concepts and recipes.      

## 2016-03-24 LAB — VITAMIN D 25 HYDROXY (VIT D DEFICIENCY, FRACTURES): Vit D, 25-Hydroxy: 74 ng/mL (ref 30–100)

## 2016-03-24 LAB — MAGNESIUM: Magnesium: 2.1 mg/dL (ref 1.5–2.5)

## 2016-04-02 IMAGING — MR MR KNEE*R* W/O CM
4 of 6 series · 19 of 40 positions shown · non-contrast
Comparison: None.

CLINICAL DATA: Right knee pain for months

EXAM:
MRI OF THE RIGHT KNEE WITHOUT CONTRAST
TECHNIQUE: Multiplanar, multisequence MR imaging of the knee was performed. No
intravenous contrast was administered.

[Series 3: PD fat-sat · axial · 4.0mm · 0.27mm/px · z∈[-54,+66]mm · 8 of 25 slices shown (1 of 4)]
[im 1/25]
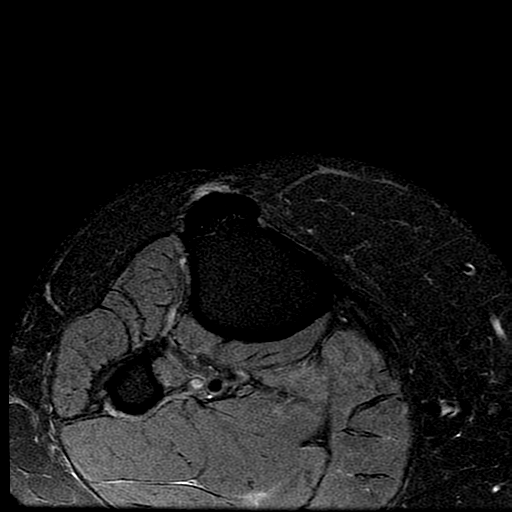
[im 4/25]
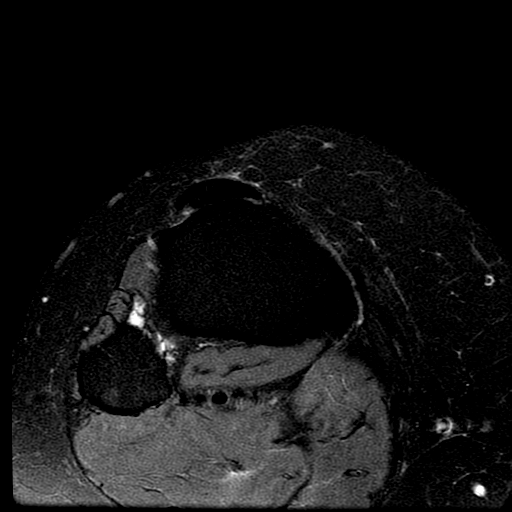
[im 7/25]
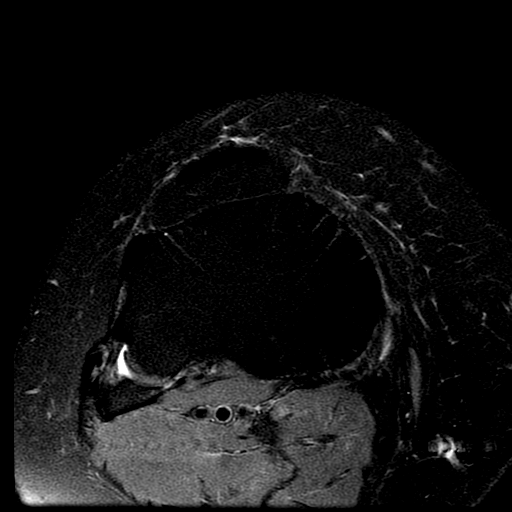
[im 11/25]
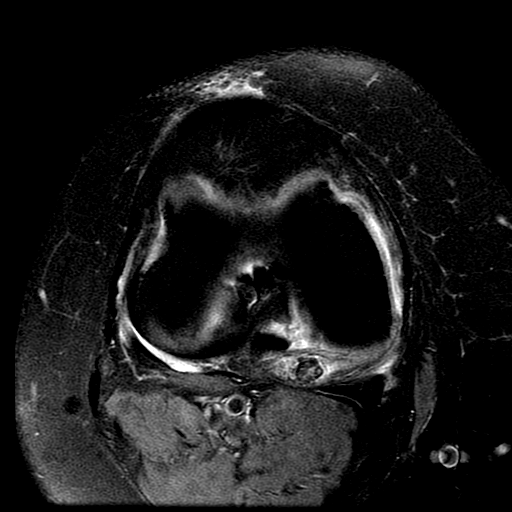
[im 14/25]
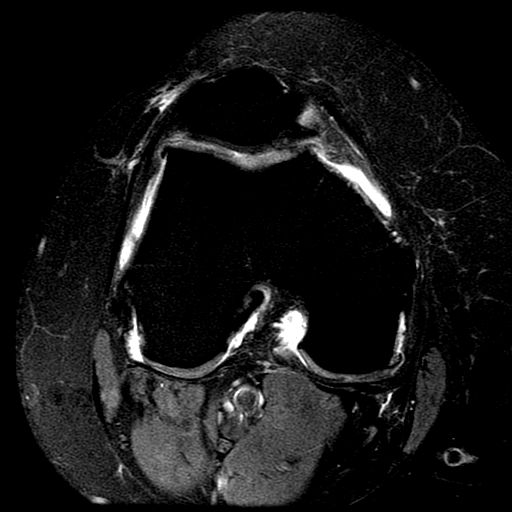
[im 18/25]
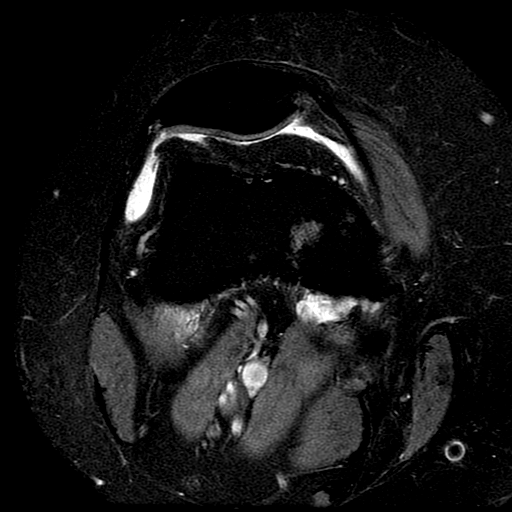
[im 21/25]
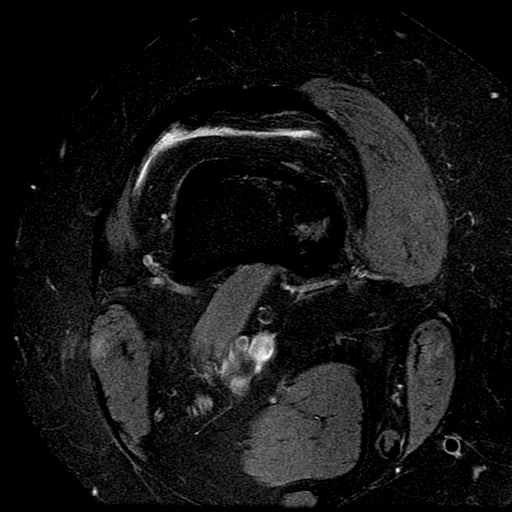
[im 25/25]
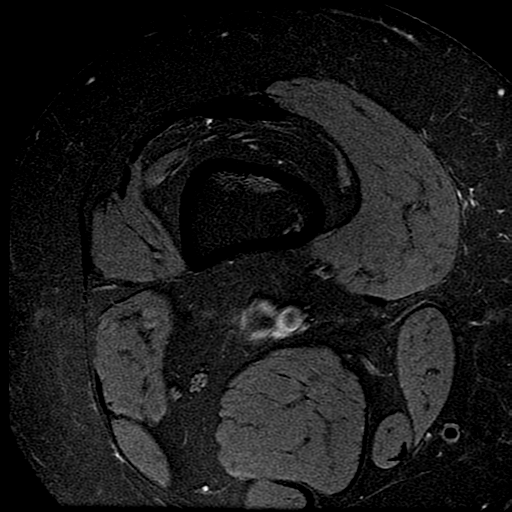

[Series 4: PD fat-sat · sagittal · 3.0mm · 0.27mm/px · 5 of 25 slices shown (2 of 4)]
[im 1/25]
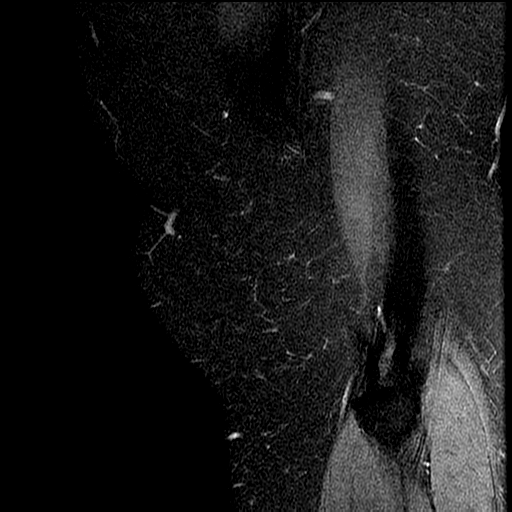
[im 5/25]
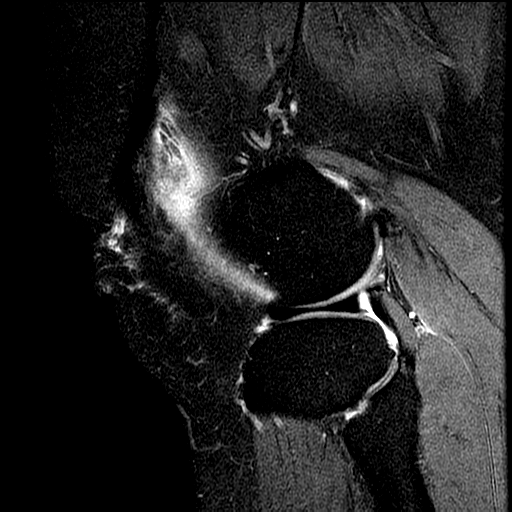
[im 9/25]
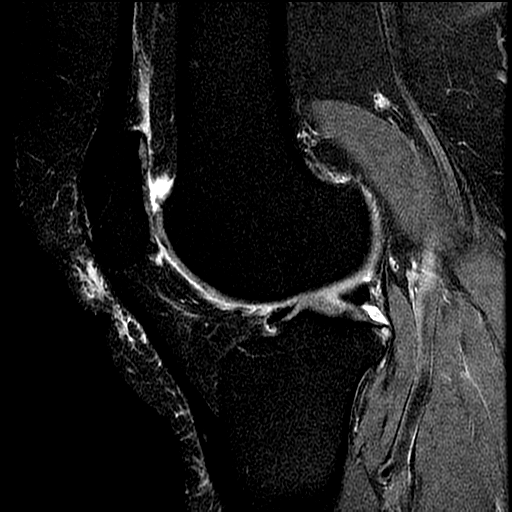
[im 13/25]
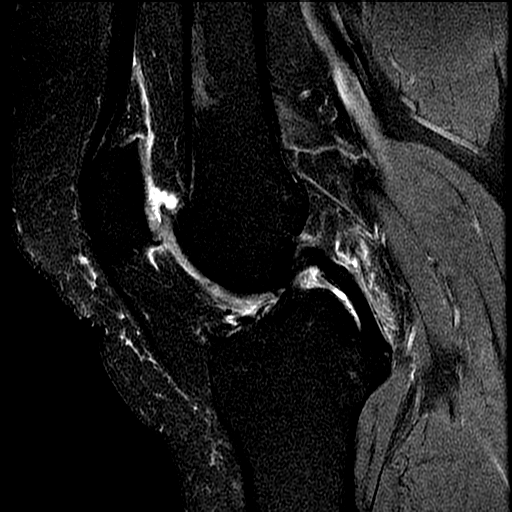
[im 21/25]
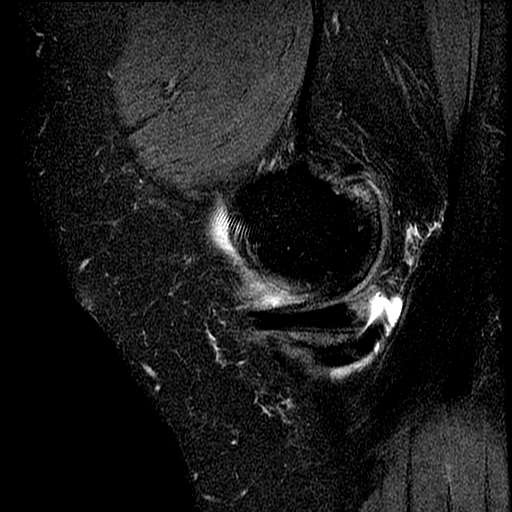

[Series 7: PD fat-sat · coronal · 4.0mm · 0.31mm/px · 3 of 26 slices shown (3 of 4)]
[im 5/26]
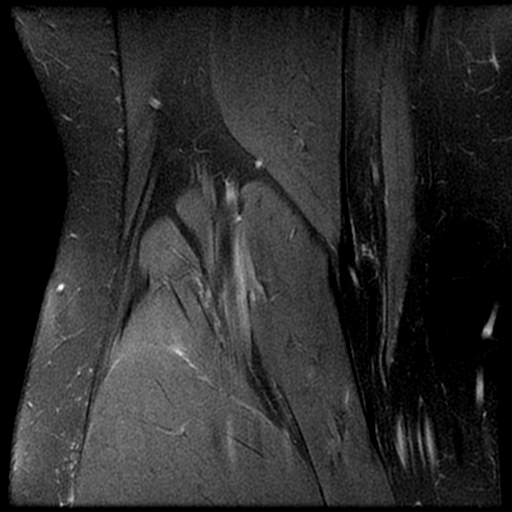
[im 13/26]
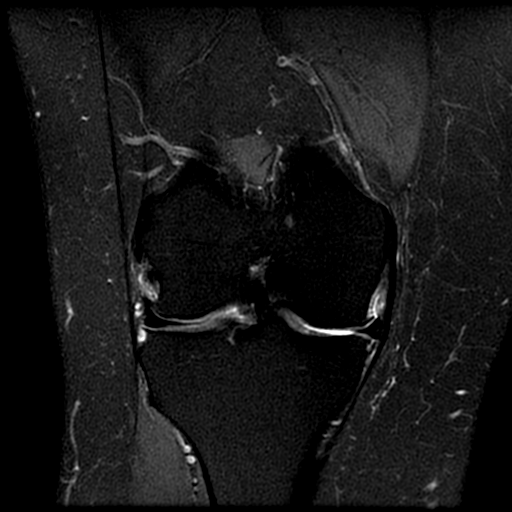
[im 21/26]
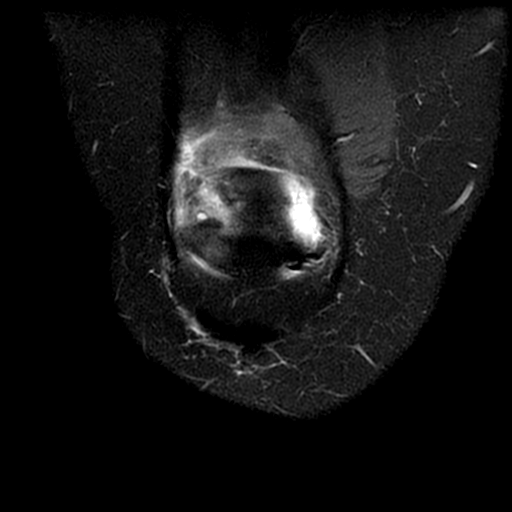

[Series 8: PD fat-sat · coronal · 2.0mm · 0.31mm/px · 3 of 13 slices shown (4 of 4)]
[im 1/13]
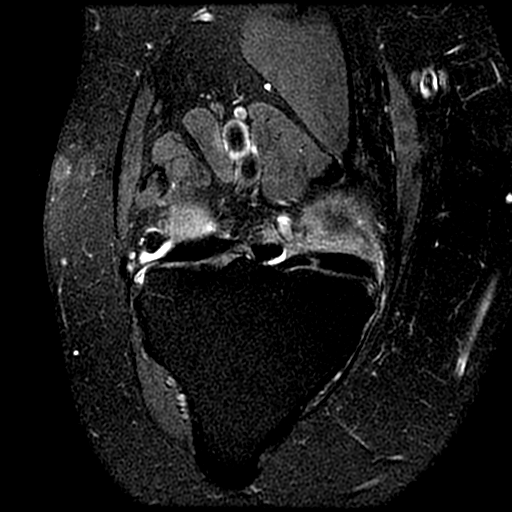
[im 9/13]
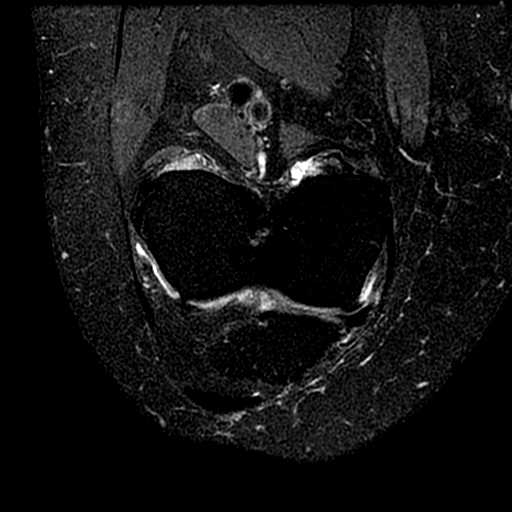
[im 13/13]
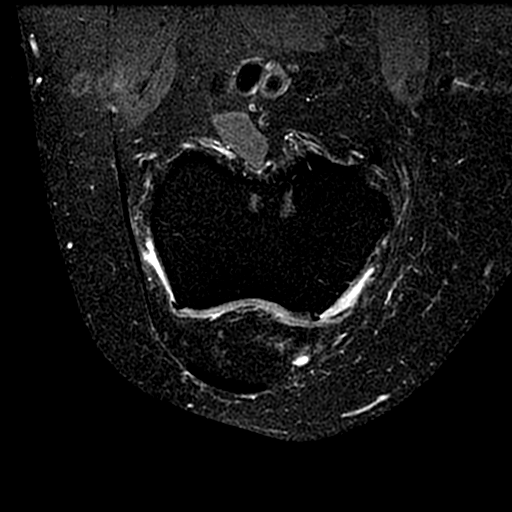

[19 of 40 positions shown; findings below may reference images not displayed]

FINDINGS: MENISCI

Medial meniscus: Complex tear of the posterior horn of the medial
meniscus.

Lateral meniscus:  Intact.

LIGAMENTS

Cruciates:  Intact ACL and PCL.

Collaterals: Medial collateral ligament is intact. Lateral
collateral ligament complex is intact.

CARTILAGE

Patellofemoral: Partial thickness cartilage loss of the medial and
lateral patellar facets with marginal osteophytosis.

Medial: High-grade partial thickness cartilage loss of the medial
femoral condyle and medial tibial plateau with marginal
osteophytosis.

Lateral: Cartilage irregularity with fissuring of the lateral
femoral condyle and lateral tibial plateau (image 16/ series 6).

Joint: Small joint effusion. Normal Hoffa's fat. No plical
thickening. There is a 10 mm loose body in the posterior medial
joint space.

Popliteal Fossa:  Intact popliteal tendon.  No Baker's cyst.

Extensor Mechanism:  Intact.

Bones: No focal marrow signal abnormality. No fracture or
dislocation.
IMPRESSION: 1. Complex tear of the posterior horn of the medial meniscus.
2. Tricompartmental cartilage abnormalities as described above.

## 2016-04-24 ENCOUNTER — Encounter: Payer: Self-pay | Admitting: Physician Assistant

## 2016-04-24 DIAGNOSIS — Z1211 Encounter for screening for malignant neoplasm of colon: Secondary | ICD-10-CM

## 2016-04-24 MED ORDER — VALACYCLOVIR HCL 500 MG PO TABS: 500.0000 mg | ORAL_TABLET | Freq: Two times a day (BID) | ORAL | 3 refills | Status: DC

## 2016-04-25 NOTE — Progress Notes (Signed)
[facility] [address] [place], [place] [address] Phone: [phone] Subjective:    I'm seeing this patient by the request  of:    CC: Foot and knee pain  [id number]  Wendy Levine is a 51 y.o. female coming in with complaint of foot and knee pain. Patient was previously seen for more of a pes planus, sesamoiditis, knee deficiency as well as neuroma the foot. Has been greater than one year since we have seen patient. Patient states that she is having increasing pain again. Has been wearing the orthotics her daily for some time. Patient is also trying to play tennis on a more regular basis. Patient denies any numbness or tingling. Patient rates the severity of pain as 7 out of 10. Seems to be starting to give her more knee pain as well. Patient difficulty as well. Known arthritic changes as well as meniscal injuries. Patient states having some mild increase difficult he of the anterior aspect of the knees bilaterally. Patient has had some bruising of the left knee. Does not remember any true injury. Has had meniscal surgery on both of these knees. Denies any giving out on her any instability. States that if she notes increasing activity has more difficulty.    Past Medical History:  Diagnosis Date  . Allergy   . Anemia   . Anxiety   . ASCUS (atypical squamous cells of undetermined significance) on Pap smear    neg HR HPV 09/2008, ascus 06/2009,10/2009  . HSV-2 infection   . Hypercholesteremia   . Hypertension   . OSA on CPAP   . Prediabetes    Past Surgical History:  Procedure Laterality Date  . CESAREAN SECTION    . COLPOSCOPY    . KNEE SURGERY     Arthroscopic  . MOUTH SURGERY    . Nexplanon insertion  05/22/2015  . remove moles     Social History   Social History  . Marital status: Married    Spouse name: N/A  . Number of children: N/A  . 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 Smoking status: Former Smoker    Packs/day: 0.30    Years: 10.00    Types: Cigarettes    Quit date: 06/08/1999  . Smokeless tobacco: Never Used  . Alcohol use 4.2 oz/week    7 Standard drinks or equivalent per week     Comment: 3 x weekly  . Drug use: No  . Sexual activity: Yes    Partners: Male    Birth control/ protection: Condom     Comment: Nexplanon 05/22/2015 1st intercourse 16 yo-5 partners   Other Topics Concern  . None   Social History Narrative   Originally from [place]. Has lived in [place] as well. Previously has traveled to most of the 20 states as well as [place]. Has prior travel to [place]. Previously has worked in Personal assistant and also with veterinarians. Has dogs and cats at home. No bird exposure. No mold exposure. Does have a hot tub/pool combination.    Allergies  Allergen Reactions  . Doxycycline Hives and Shortness Of Breath  . Ace Inhibitors     cough  . Adhesive [Tape]   . Amoxicillin-Pot Clavulanate Nausea Only    diarrhea  . Clindamycin/Lincomycin   . Vicodin [Hydrocodone-Acetaminophen] Itching   Family History  Problem Relation Age of Onset  . Hypertension  Maternal Grandmother   . Breast cancer Maternal Grandmother 80  . Emphysema Maternal Grandmother   . Hypertension Mother   . Hyperlipidemia Mother   . Cancer Mother     Skin    Past medical history, social, surgical and family history all reviewed in electronic medical record.  No pertanent information unless stated regarding to the chief complaint.   Review of Systems: No headache, visual changes, nausea, vomiting, diarrhea, constipation, dizziness, abdominal pain, skin rash, fevers, chills, night sweats, weight loss, swollen lymph nodes, body aches, joint swelling, muscle aches, chest pain, shortness of breath, mood changes.    Objective  Blood pressure 136/86, pulse 64, height 5\' 6"  (1.676 m), weight 233 lb (105.7 kg), SpO2 99 %. Systems examined below as of 04/27/16     General: No apparent distress alert and oriented x3 mood and affect normal, dressed appropriately.  HEENT: Pupils equal, extraocular movements intact  Respiratory: Patient's speak in full sentences and does not appear short of breath  Cardiovascular: No lower extremity edema, non tender, no erythema  Skin: Warm dry intact with no signs of infection or rash on extremities or on axial skeleton.  Abdomen: Soft nontender  Neuro: Cranial nerves II through XII are intact, neurovascularly intact in all extremities with 2+ DTRs and 2+ pulses.  Lymph: No lymphadenopathy of posterior or anterior cervical chain or axillae bilaterally.  Gait normal with good balance and coordination.  MSK:  Non tender with full range of motion and good stability and symmetric strength and tone of shoulders, elbows, wrist, hip, and ankles bilaterally.  Foot exam shows the patient does have some pes planus bilaterally. Splaying between the first and second toes. Patient does have breakdown of the transverse arch left greater then right. Knee: Bilateral Lateral tilt noted of the kneecap bilaterally Tenderness over the superior lateral aspect of any ROM full in flexion and extension and lower leg rotation. Ligaments with solid consistent endpoints including ACL, PCL, LCL, MCL. Negative Mcmurray's, Apley's, and Thessalonian tests. painful patellar compression. Patellar glide without crepitus. Patellar and quadriceps tendons unremarkable. Hamstring and quadriceps strength is normal.   MSK US performed of: right and left    This study was ordered, performed, and interpreted by Charlann Boxer D.O.  Knee: All structures visualized. Mild narrowing of the patellofemoral and the medial meniscus Patellar Tendon unremarkable on long and transverse views without effusion. No abnormality of prepatellar bursa. LCL and MCL unremarkable on long and transverse views. No abnormality of origin of medial or lateral head of the  gastrocnemius.  IMPRESSION:  Mild patellofemoral syndrome bilaterally    Impression and Recommendations:     This case required medical decision making of moderate complexity.      Note: This dictation was prepared with Dragon dictation along with smaller phrase technology. Any transcriptional errors that result from this process are unintentional.

## 2016-04-27 ENCOUNTER — Ambulatory Visit: Payer: Self-pay

## 2016-04-27 ENCOUNTER — Ambulatory Visit (INDEPENDENT_AMBULATORY_CARE_PROVIDER_SITE_OTHER): Payer: BLUE CROSS/BLUE SHIELD | Admitting: Family Medicine

## 2016-04-27 ENCOUNTER — Encounter: Payer: Self-pay | Admitting: Family Medicine

## 2016-04-27 VITALS — BP 136/86 | HR 64 | Ht 66.0 in | Wt 233.0 lb

## 2016-04-27 DIAGNOSIS — M2141 Flat foot [pes planus] (acquired), right foot: Secondary | ICD-10-CM | POA: Diagnosis not present

## 2016-04-27 DIAGNOSIS — M1710 Unilateral primary osteoarthritis, unspecified knee: Secondary | ICD-10-CM

## 2016-04-27 DIAGNOSIS — M25561 Pain in right knee: Secondary | ICD-10-CM | POA: Diagnosis not present

## 2016-04-27 DIAGNOSIS — M2142 Flat foot [pes planus] (acquired), left foot: Secondary | ICD-10-CM | POA: Diagnosis not present

## 2016-04-27 NOTE — Assessment & Plan Note (Signed)
Bilateral. We discussed icing regimen and home exercises. We discussed which activities to do an which was to avoid. Patient will continue to be active. If patient has worsening symptoms we'll consider injection.

## 2016-04-27 NOTE — Patient Instructions (Signed)
God to see you  I think you may have a small chip in the knee and the other knee cap may have a very small fracture.  Continue the vitamin D Arnica lotion 2 times a day can help with bruising.  See me again in 3 weeks for the knee.  We will call yo uin the next week for the orthotics.

## 2016-04-27 NOTE — Assessment & Plan Note (Signed)
New orthotics needed.  Patient will be fitte for these in the near future. I do believe that som of thesefeet coul ufontyotruas well. Discussed icing regimen. Discussed home exercises. Patient will continue be active. Follow-up again 2 weeks after the new orthotics.

## 2016-04-28 ENCOUNTER — Encounter: Payer: Self-pay | Admitting: Gastroenterology

## 2016-05-05 ENCOUNTER — Other Ambulatory Visit: Payer: Self-pay | Admitting: Physician Assistant

## 2016-05-07 ENCOUNTER — Encounter: Payer: Self-pay | Admitting: Family Medicine

## 2016-05-07 ENCOUNTER — Ambulatory Visit (INDEPENDENT_AMBULATORY_CARE_PROVIDER_SITE_OTHER): Payer: BLUE CROSS/BLUE SHIELD | Admitting: Family Medicine

## 2016-05-07 DIAGNOSIS — M2142 Flat foot [pes planus] (acquired), left foot: Secondary | ICD-10-CM

## 2016-05-07 DIAGNOSIS — M2141 Flat foot [pes planus] (acquired), right foot: Secondary | ICD-10-CM | POA: Diagnosis not present

## 2016-05-07 NOTE — Progress Notes (Signed)
Patient was fitted for a : standard, cushioned, semi-rigid orthotic. The orthotic was heated and afterward the patient was in a seated position and the orthotic molded. The patient was positioned in subtalar neutral position and 10 degrees of ankle dorsiflexion in a non-weight bearing stance. After completion of molding, patient did have orthotic management which included instructions on acclimating to the orthotics, signs of ill fit as well as care for the orthotic.   The blank was ground to a stable position for weight bearing. Size: Women's 7, All-around Base: Carbon fiber Additional Posting and Padding: The following postings were fitted onto the molded orthotics to help maintain a talar neutral position - Wedge posting for transverse arch: bilateral, 309/40 Silicone posting for longitudinal arch: Left only, 200/35 The patient ambulated these, and they were very comfortable and supportive.

## 2016-05-07 NOTE — Assessment & Plan Note (Signed)
Orthotics made today Patient was to increase wear over the course of time very slowly. We discussed some potential adjustments that may be needed. Patient come back and see me again in 3-4 weeks for further evaluation.

## 2016-05-15 ENCOUNTER — Other Ambulatory Visit: Payer: Self-pay | Admitting: Physician Assistant

## 2016-06-23 ENCOUNTER — Encounter: Payer: Self-pay | Admitting: Physician Assistant

## 2016-06-24 ENCOUNTER — Ambulatory Visit (AMBULATORY_SURGERY_CENTER): Payer: Self-pay | Admitting: *Deleted

## 2016-06-24 VITALS — Ht 66.0 in | Wt 247.0 lb

## 2016-06-24 DIAGNOSIS — Z1211 Encounter for screening for malignant neoplasm of colon: Secondary | ICD-10-CM

## 2016-06-24 MED ORDER — NA SULFATE-K SULFATE-MG SULF 17.5-3.13-1.6 GM/177ML PO SOLN: ORAL | 0 refills | Status: DC

## 2016-06-24 NOTE — Progress Notes (Signed)
Patient denies any allergies to eggs or soy. Patient denies any problems with anesthesia/sedation. Patient denies any oxygen use at home and does not take any diet/weight loss medications. EMMI education assisgned to patient on colonoscopy, this was explained and instructions given to patient. 

## 2016-07-06 ENCOUNTER — Other Ambulatory Visit: Payer: Self-pay | Admitting: Physician Assistant

## 2016-07-06 DIAGNOSIS — F411 Generalized anxiety disorder: Secondary | ICD-10-CM

## 2016-07-08 ENCOUNTER — Ambulatory Visit (AMBULATORY_SURGERY_CENTER): Payer: BLUE CROSS/BLUE SHIELD | Admitting: Gastroenterology

## 2016-07-08 ENCOUNTER — Encounter: Payer: Self-pay | Admitting: Gastroenterology

## 2016-07-08 VITALS — BP 120/75 | HR 52 | Temp 98.9°F | Resp 11 | Ht 66.0 in | Wt 247.0 lb

## 2016-07-08 DIAGNOSIS — Z1211 Encounter for screening for malignant neoplasm of colon: Secondary | ICD-10-CM | POA: Diagnosis not present

## 2016-07-08 DIAGNOSIS — Z1212 Encounter for screening for malignant neoplasm of rectum: Secondary | ICD-10-CM | POA: Diagnosis not present

## 2016-07-08 DIAGNOSIS — D123 Benign neoplasm of transverse colon: Secondary | ICD-10-CM | POA: Diagnosis not present

## 2016-07-08 DIAGNOSIS — D122 Benign neoplasm of ascending colon: Secondary | ICD-10-CM

## 2016-07-08 MED ORDER — SODIUM CHLORIDE 0.9 % IV SOLN: 500.0000 mL | INTRAVENOUS | Status: DC

## 2016-07-08 NOTE — Progress Notes (Signed)
A/ox3 pleased with MAC, report to [facility]

## 2016-07-08 NOTE — Op Note (Signed)
Wendy Levine: Wendy Levine Procedure Date: 07/08/2016 8:46 AM MRN: [id number] Endoscopist: Remo Lipps P. Kenderick Kobler MD, MD Age: 51 Referring MD:  Date of Birth: [birth year] Gender: Female Account #: [id number] Procedure:                Colonoscopy Indications:              Screening for colorectal malignant neoplasm Medicines:                Monitored Anesthesia Care Procedure:                Pre-Anesthesia Assessment:                           - Prior to the procedure, a History and Physical                            was performed, and patient medications and                            allergies were reviewed. The patient's tolerance of                            previous anesthesia was also reviewed. The risks                            and benefits of the procedure and the sedation                            options and risks were discussed with the patient.                            All questions were answered, and informed consent                            was obtained. Prior Anticoagulants: The patient has                            taken no previous anticoagulant or antiplatelet                            agents. ASA Grade Assessment: III - A patient with                            severe systemic disease. After reviewing the risks                            and benefits, the patient was deemed in                            satisfactory condition to undergo the procedure.                           After obtaining informed consent, the colonoscope  was passed under direct vision. Throughout the                            procedure, the patient's blood pressure, pulse, and                            oxygen saturations were monitored continuously. The                            Colonoscope was introduced through the anus and                            advanced to the the cecum, identified by   appendiceal orifice and ileocecal valve. The                            colonoscopy was performed without difficulty. The                            patient tolerated the procedure well. The quality                            of the bowel preparation was good. The ileocecal                            valve, appendiceal orifice, and rectum were                            photographed. Scope In: 9:09:16 AM Scope Out: 9:35:58 AM Scope Withdrawal Time: 0 hours 23 minutes 53 seconds  Total Procedure Duration: 0 hours 26 minutes 42 seconds  Findings:                 The perianal and digital rectal examinations were                            normal.                           A 8 mm polyp was found in the ascending colon. The                            polyp was flat. The polyp was removed with a cold                            snare. Resection and retrieval were complete.                           A 5 mm polyp was found in the hepatic flexure. The                            polyp was sessile. The polyp was removed with a  cold snare. Resection and retrieval were complete.                           Internal hemorrhoids were found during retroflexion.                           There was significant spasm at the hepatic flexure                            and in the ascending colon during polypectomy,                            which prolonged the procedure.                           The exam was otherwise without abnormality. Complications:            No immediate complications. Estimated blood loss:                            Minimal. Estimated Blood Loss:     Estimated blood loss was minimal. Impression:               - One 8 mm polyp in the ascending colon, removed                            with a cold snare. Resected and retrieved.                           - One 5 mm polyp at the hepatic flexure, removed                            with a cold snare. Resected and  retrieved.                           - Internal hemorrhoids.                           - Colonic spasm.                           - The examination was otherwise normal. Recommendation:           - Patient has a contact number available for                            emergencies. The signs and symptoms of potential                            delayed complications were discussed with the                            patient. Return to normal activities tomorrow.                            Written discharge instructions were provided to  the                            patient.                           - Resume previous diet.                           - Continue present medications.                           - Await pathology results.                           - Repeat colonoscopy is recommended for                            surveillance. The colonoscopy date will be                            determined after pathology results from today's                            exam become available for review.                           - No ibuprofen, naproxen, or other non-steroidal                            anti-inflammatory drugs for 2 weeks after polyp                            removal. [name] MD, MD 07/08/2016 9:40:44 AM This report has been signed electronically.

## 2016-07-08 NOTE — Patient Instructions (Signed)
Discharge instructions given. Handouts on polyps and hemorrhoids. Resume previous medications. No ibuprofen, naproxen, or other NSAIDs for two weeks. YOU HAD AN ENDOSCOPIC PROCEDURE TODAY AT THE [facility]:   Refer to the procedure report that was given to you for any specific questions about what was found during the examination.  If the procedure report does not answer your questions, please call your gastroenterologist to clarify.  If you requested that your care partner not be given the details of your procedure findings, then the procedure report has been included in a sealed envelope for you to review at your convenience later.  YOU SHOULD EXPECT: Some feelings of bloating in the abdomen. Passage of more gas than usual.  Walking can help get rid of the air that was put into your GI tract during the procedure and reduce the bloating. If you had a lower endoscopy (such as a colonoscopy or flexible sigmoidoscopy) you may notice spotting of blood in your stool or on the toilet paper. If you underwent a bowel prep for your procedure, you may not have a normal bowel movement for a few days.  Please Note:  You might notice some irritation and congestion in your nose or some drainage.  This is from the oxygen used during your procedure.  There is no need for concern and it should clear up in a day or so.  SYMPTOMS TO REPORT IMMEDIATELY:   Following lower endoscopy (colonoscopy or flexible sigmoidoscopy):  Excessive amounts of blood in the stool  Significant tenderness or worsening of abdominal pains  Swelling of the abdomen that is new, acute  Fever of 100F or higher   For urgent or emergent issues, a gastroenterologist can be reached at any hour by calling [phone].   DIET:  We do recommend a small meal at first, but then you may proceed to your regular diet.  Drink plenty of fluids but you should avoid alcoholic beverages for 24 hours.  ACTIVITY:  You should plan to take  it easy for the rest of today and you should NOT DRIVE or use heavy machinery until tomorrow (because of the sedation medicines used during the test).    FOLLOW UP: Our staff will call the number listed on your records the next business day following your procedure to check on you and address any questions or concerns that you may have regarding the information given to you following your procedure. If we do not reach you, we will leave a message.  However, if you are feeling well and you are not experiencing any problems, there is no need to return our call.  We will assume that you have returned to your regular daily activities without incident.  If any biopsies were taken you will be contacted by phone or by letter within the next 1-3 weeks.  Please call us at [phone] if you have not heard about the biopsies in 3 weeks.    SIGNATURES/CONFIDENTIALITY: You and/or your care partner have signed paperwork which will be entered into your electronic medical record.  These signatures attest to the fact that that the information above on your After Visit Summary has been reviewed and is understood.  Full responsibility of the confidentiality of this discharge information lies with you and/or your care-partner.  

## 2016-07-08 NOTE — Progress Notes (Signed)
Called to room to assist during endoscopic procedure.  Patient ID and intended procedure confirmed with present staff. Received instructions for my participation in the procedure from the performing physician.  

## 2016-07-09 ENCOUNTER — Telehealth: Payer: Self-pay

## 2016-07-09 ENCOUNTER — Encounter: Payer: Self-pay | Admitting: Physician Assistant

## 2016-07-09 NOTE — Telephone Encounter (Signed)
   Follow up Call-  Call back number 07/08/2016  Post procedure Call Back phone  # [phone]  Permission to leave phone message Yes  Some recent data might be hidden     Patient questions:  Do you have a fever, pain , or abdominal swelling? No. Pain Score  0 *  Have you tolerated food without any problems? Yes.    Have you been able to return to your normal activities? Yes.    Do you have any questions about your discharge instructions: Diet   No. Medications  No. Follow up visit  No.  Do you have questions or concerns about your Care? No.  Actions: * If pain score is 4 or above: No action needed, pain <4.

## 2016-07-13 ENCOUNTER — Encounter: Payer: Self-pay | Admitting: Gastroenterology

## 2016-07-14 ENCOUNTER — Encounter: Payer: Self-pay | Admitting: Physician Assistant

## 2016-07-16 ENCOUNTER — Encounter: Payer: Self-pay | Admitting: Physician Assistant

## 2016-07-22 ENCOUNTER — Encounter: Payer: Self-pay | Admitting: Physician Assistant

## 2016-07-22 ENCOUNTER — Ambulatory Visit (INDEPENDENT_AMBULATORY_CARE_PROVIDER_SITE_OTHER): Payer: BLUE CROSS/BLUE SHIELD | Admitting: Physician Assistant

## 2016-07-22 VITALS — BP 130/82 | HR 62 | Temp 98.1°F | Resp 14 | Ht 66.0 in | Wt 241.0 lb

## 2016-07-22 DIAGNOSIS — M7072 Other bursitis of hip, left hip: Secondary | ICD-10-CM

## 2016-07-22 DIAGNOSIS — M5432 Sciatica, left side: Secondary | ICD-10-CM | POA: Diagnosis not present

## 2016-07-22 MED ORDER — METHOCARBAMOL 750 MG PO TABS: 750.0000 mg | ORAL_TABLET | Freq: Three times a day (TID) | ORAL | 1 refills | Status: DC | PRN

## 2016-07-22 MED ORDER — DEXAMETHASONE SODIUM PHOSPHATE 100 MG/10ML IJ SOLN
20.0000 mg | Freq: Once | INTRAMUSCULAR | Status: AC
  Administered 2016-07-22: 20 mg via INTRAMUSCULAR

## 2016-07-22 MED ORDER — MELOXICAM 15 MG PO TABS: ORAL_TABLET | ORAL | 1 refills | Status: DC

## 2016-07-22 NOTE — Progress Notes (Signed)
Subjective:    Patient ID: Wendy Levine, female    DOB: Jun 15, 1965, 51 y.o.   MRN: [id number]  HPI 51 y.o. WF presents with left hip pain.  Has been doing elliptical and bike, walk/ran the boro race and has had worse pain. She has bilateral lower back pain and specifically left back pain at hip and at left front hip with palpation. Hurts with sitting for a long time and with lying on it. No pain down her leg, no weakness, numbness, tingling, fever, chills, no urinary symptoms.   Blood pressure 130/82, pulse 62, temperature 98.1 F (36.7 C), resp. rate 14, height 5\' 6"  (1.676 m), weight 241 lb (109.3 kg), SpO2 96 %.  Medications Current Outpatient Prescriptions on File Prior to Visit  Medication Sig  . ALPRAZolam (XANAX) 0.5 MG tablet TAKE 1 TABLET BY MOUTH TWICE DAILY AS NEEDED WHILE FLYING  . escitalopram (LEXAPRO) 10 MG tablet TAKE ONE TABLET BY MOUTH DAILY.  Marland Kitchen etonogestrel (NEXPLANON) 68 MG IMPL implant 1 each by Subdermal route once.  . IRON PO Take by mouth daily. Reported on 05/28/2015  . MAGNESIUM PO Take by mouth daily. Reported on 06/27/2015  . rosuvastatin (CRESTOR) 10 MG tablet TAKE 1 TABLET (10 MG TOTAL) BY MOUTH DAILY.  Marland Kitchen triamcinolone ointment (KENALOG) 0.1 % triamcinolone acetonide 0.1 % topical ointment  . valACYclovir (VALTREX) 500 MG tablet Take 1 tablet (500 mg total) by mouth 2 (two) times daily.  . valsartan (DIOVAN) 160 MG tablet TAKE ONE-HALF TO ONE TABLET BY MOUTH FOR FOR BLOOD PRESSURE  . Vitamin D, Ergocalciferol, (DRISDOL) 50000 units CAPS capsule TAKE ONE CAPSULE BY MOUTH THREE TIMES A WEEK (Patient taking differently: Take by mouth 2 (two) times a week. TAKE ONE CAPSULE BY MOUTH THREE TIMES A WEEK)   Current Facility-Administered Medications on File Prior to Visit  Medication  . 0.9 %  sodium chloride infusion    Problem list She has Hypercholesteremia; Hypertension; HSV-2 infection; ASCUS favoring benign; Pes planus of both feet; Primary localized  osteoarthrosis, lower leg; Obesity; Insulin resistance; OSA (obstructive sleep apnea); Vitamin D deficiency; Medication management; Anemia; Medial tibial stress syndrome; Sesamoiditis; Morton's neuroma of left foot; Carpal tunnel syndrome, bilateral; Dyspnea; and Patellofemoral arthritis on her problem list.   Review of Systems  Constitutional: Positive for fatigue. Negative for activity change, appetite change, chills, diaphoresis, fever and unexpected weight change.  HENT: Negative.   Gastrointestinal: Negative.  Negative for nausea and vomiting.  Genitourinary: Negative for difficulty urinating, dysuria, flank pain, frequency, hematuria and urgency.  Musculoskeletal: Positive for arthralgias and back pain. Negative for gait problem, joint swelling, myalgias and neck pain.       Objective:   Physical Exam  Constitutional: She is oriented to person, place, and time. She appears well-developed and well-nourished.  HENT:  Head: Normocephalic and atraumatic.  Eyes: Conjunctivae are normal. Pupils are equal, round, and reactive to light.  Neck: Normal range of motion. Neck supple.  Cardiovascular: Normal rate and regular rhythm.   Pulmonary/Chest: Effort normal and breath sounds normal.  Abdominal: Soft. Bowel sounds are normal. There is no tenderness.  Musculoskeletal:  Patient is able to ambulate well. Gait is  Antalgic.  Left hip: full painless range of motion, without tenderness, positives: tenderness over greater trochanter and negatives: FROM no pain with heel impact pulses full with left SI pain. Straight leg raising with dorsiflexion negative bilaterally for radicular symptoms. Sensory exam in the legs are normal. Knee reflexes are normal Ankle reflexes are  normal Strength is normal and symmetric in arms and legs. Good distal sensations and pulses bilaterally.  Lymphadenopathy:    She has no cervical adenopathy.  Neurological: She is alert and oriented to person, place, and time.  She has normal reflexes.  Skin: Skin is warm and dry. No rash noted.      Assessment & Plan:   Left sided sciatica If not better will refer to ortho Stretches given - meloxicam (MOBIC) 15 MG tablet; Take one daily with food for 2 weeks, can take with tylenol, can not take with aleve, iburpofen, then as needed daily for pain  Dispense: 30 tablet; Refill: 1 - methocarbamol (ROBAXIN-750) 750 MG tablet; Take 1 tablet (750 mg total) by mouth every 8 (eight) hours as needed for muscle spasms.  Dispense: 60 tablet; Refill: 1 - dexamethasone (DECADRON) injection 20 mg; Inject 2 mLs (20 mg total) into the muscle once.  Bursitis of left hip, unspecified bursa Information given, RICE, if not better follow up ortho - dexamethasone (DECADRON) injection 20 mg; Inject 2 mLs (20 mg total) into the muscle once.

## 2016-07-22 NOTE — Patient Instructions (Addendum)
Hip Bursitis Hip bursitis is inflammation of a fluid-filled sac (bursa) in the hip joint. The bursa protects the bones in the hip joint from rubbing against each other. Hip bursitis can cause mild to moderate pain, and symptoms often come and go over time. What are the causes? This condition may be caused by:  Injury to the hip.  Overuse of the muscles that surround the hip joint.  Arthritis or gout.  Diabetes.  Thyroid disease.  Cold weather.  Infection. In some cases, the cause may not be known. What are the signs or symptoms? Symptoms of this condition may include:  Mild or moderate pain in the hip area. Pain may get worse with movement.  Tenderness and swelling of the hip, especially on the outer side of the hip. Symptoms may come and go. If the bursa becomes infected, you may have the following symptoms:  Fever.  Red skin and a feeling of warmth in the hip area. How is this diagnosed? This condition may be diagnosed based on:  A physical exam.  Your medical history.  X-rays.  Removal of fluid from your inflamed bursa for testing (biopsy). You may be sent to a health care provider who specializes in bone diseases (orthopedist) or a provider who specializes in joint inflammation (rheumatologist). How is this treated? This condition is treated by resting, raising (elevating), and applying pressure(compression) to the injured area. In some cases, this may be enough to make your symptoms go away. Treatment may also include:  Crutches.  Antibiotic medicine.  Draining fluid out of the bursa to help relieve swelling.  Injecting medicine that helps to reduce inflammation (cortisone). Follow these instructions at home: Medicines   Take over-the-counter and prescription medicines only as told by your health care provider.  Do not drive or operate heavy machinery while taking prescription pain medicine, or as told by your health care provider.  If you were  prescribed an antibiotic, take it as told by your health care provider. Do not stop taking the antibiotic even if you start to feel better. Activity   Return to your normal activities as told by your health care provider. Ask your health care provider what activities are safe for you.  Rest and protect your hip as much as possible until your pain and swelling get better. General instructions   Wear compression wraps only as told by your health care provider.  Elevate your hip above the level of your heart as much as you can without pain. To do this, try putting a pillow under your hips while you lie down.  Do not use your hip to support your body weight until your health care provider says that you can. Use crutches as told by your health care provider.  Gently massage and stretch your injured area as often as is comfortable.  Keep all follow-up visits as told by your health care provider. This is important. How is this prevented?  Exercise regularly, as told by your health care provider.  Warm up and stretch before being active.  Cool down and stretch after being active.  If an activity irritates your hip or causes pain, avoid the activity as much as possible.  Avoid sitting down for long periods at a time. Contact a health care provider if:  You have a fever.  You develop new symptoms.  You have difficulty walking or doing everyday activities.  You have pain that gets worse or does not get better with medicine.  You develop red  skin or a feeling of warmth in your hip area. Get help right away if:  You cannot move your hip.  You have severe pain. This information is not intended to replace advice given to you by your health care provider. Make sure you discuss any questions you have with your health care provider. Document Released: 08/01/2001 Document Revised: 07/18/2015 Document Reviewed: 09/11/2014 Elsevier Interactive Patient Education  2017 [employer].  Sacroiliac Joint Dysfunction Sacroiliac joint dysfunction is a condition that causes inflammation on one or both sides of the sacroiliac (SI) joint. The SI joint connects the lower part of the spine (sacrum) with the two upper portions of the pelvis (ilium). This condition causes deep aching or burning pain in the low back. In some cases, the pain may also spread into one or both buttocks or hips or spread down the legs. What are the causes? This condition may be caused by:  Pregnancy. During pregnancy, extra stress is put on the SI joints because the pelvis widens.  Injury, such as:  Car accidents.  Sport-related injuries.  Work-related injuries.  Having one leg that is shorter than the other.  Conditions that affect the joints, such as:  Rheumatoid arthritis.  Gout.  Psoriatic arthritis.  Joint infection (septic arthritis). Sometimes, the cause of SI joint dysfunction is not known. What are the signs or symptoms? Symptoms of this condition include:  Aching or burning pain in the lower back. The pain may also spread to other areas, such as:  Buttocks.  Groin.  Thighs and legs.  Muscle spasms in or around the painful areas.  Increased pain when standing, walking, running, stair climbing, bending, or lifting. How is this diagnosed? Your health care provider will do a physical exam and take your medical history. During the exam, the health care provider may move one or both of your legs to different positions to check for pain. Various tests may be done to help verify the diagnosis, including:  Imaging tests to look for other causes of pain. These may include:  MRI.  CT scan.  Bone scan.  Diagnostic injection. A numbing medicine is injected into the SI joint using a needle. If the pain is temporarily improved or stopped after the injection, this can indicate that SI joint dysfunction is the problem. How is this treated? Treatment may vary depending on the  cause and severity of your condition. Treatment options may include:  Applying ice or heat to the lower back area. This can help to reduce pain and muscle spasms.  Medicines to relieve pain or inflammation or to relax the muscles.  Wearing a back brace (sacroiliac brace) to help support the joint while your back is healing.  Physical therapy to increase muscle strength around the joint and flexibility at the joint. This may also involve learning proper body positions and ways of moving to relieve stress on the joint.  Direct manipulation of the SI joint.  Injections of steroid medicine into the joint in order to reduce pain and swelling.  Radiofrequency ablation to burn away nerves that are carrying pain messages from the joint.  Use of a device that provides electrical stimulation in order to reduce pain at the joint.  Surgery to put in screws and plates that limit or prevent joint motion. This is rare. Follow these instructions at home:  Rest as needed. Limit your activities as directed by your health care provider.  Take medicines only as directed by your health care provider.  If  directed, apply ice to the affected area:  Put ice in a plastic bag.  Place a towel between your skin and the bag.  Leave the ice on for 20 minutes, 2-3 times per day.  Use a heating pad or a moist heat pack as directed by your health care provider.  Exercise as directed by your health care provider or physical therapist.  Keep all follow-up visits as directed by your health care provider. This is important. Contact a health care provider if:  Your pain is not controlled with medicine.  You have a fever.  You have increasingly severe pain. Get help right away if:  You have weakness, numbness, or tingling in your legs or feet.  You lose control of your bladder or bowel. This information is not intended to replace advice given to you by your health care provider. Make sure you discuss any  questions you have with your health care provider. Document Released: 05/08/2008 Document Revised: 07/18/2015 Document Reviewed: 10/17/2013 Elsevier Interactive Patient Education  2017 [employer].   Simple math prevails.    1st - exercise does not produce significant weight loss - at best one converts fat into muscle , "bulks up", loses inches, but usually stays "weight neutral"     2nd - think of your body weightas a check book: If you eat more calories than you burn up - you save money or gain weight .... Or if you spend more money than you put in the check book, ie burn up more calories than you eat, then you lose weight     3rd - if you walk or run 1 mile, you burn up 100 calories - you have to burn up 3,500 calories to lose 1 pound, ie you have to walk/run 35 miles to lose 1 measly pound. So if you want to lose 10 #, then you have to walk/run 350 miles, so.... clearly exercise is not the solution.     4. So if you consume 1,500 calories, then you have to burn up the equivalent of 15 miles to stay weight neutral - It also stands to reason that if you consume 1,500 cal/day and don't lose weight, then you must be burning up about 1,500 cals/day to stay weight neutral.     5. If you really want to lose weight, you must cut your calorie intake 300 calories /day and at that rate you should lose about 1 # every 3 days.   6. Please purchase Dr Fara Olden Fuhrman's book(s) "The End of Dieting" & "Eat to Live" . It has some great concepts and recipes.

## 2016-08-06 ENCOUNTER — Other Ambulatory Visit: Payer: Self-pay | Admitting: Physician Assistant

## 2016-08-21 ENCOUNTER — Other Ambulatory Visit: Payer: Self-pay | Admitting: Physician Assistant

## 2016-08-21 DIAGNOSIS — F411 Generalized anxiety disorder: Secondary | ICD-10-CM

## 2016-08-24 ENCOUNTER — Encounter: Payer: Self-pay | Admitting: Physician Assistant

## 2016-08-24 NOTE — Telephone Encounter (Signed)
Xanax was called into pharmacy on 2nd July 2018 @ 8:34am by DD.

## 2016-10-06 ENCOUNTER — Other Ambulatory Visit: Payer: Self-pay | Admitting: Physician Assistant

## 2016-10-06 MED ORDER — LOSARTAN POTASSIUM 100 MG PO TABS: 100.0000 mg | ORAL_TABLET | Freq: Every day | ORAL | 11 refills | Status: DC

## 2016-10-15 ENCOUNTER — Other Ambulatory Visit: Payer: Self-pay | Admitting: Physician Assistant

## 2016-10-15 DIAGNOSIS — F411 Generalized anxiety disorder: Secondary | ICD-10-CM

## 2016-10-15 NOTE — Progress Notes (Signed)
COMPLETE PHYSICAL  Assessment and Plan:   Essential hypertension - continue medications, DASH diet, exercise and monitor at home. Call if greater than 130/80.  -     CBC with Differential/Platelet -     BASIC METABOLIC PANEL WITH GFR -     Hepatic function panel -     TSH -     Urinalysis, Routine w reflex microscopic -     Microalbumin / creatinine urine ratio -     EKG 12-Lead  OSA (obstructive sleep apnea) Continue CPAP  Hypercholesteremia -continue medications, check lipids, decrease fatty foods, increase activity.  -     Lipid panel  Class 2 obesity due to excess calories without serious comorbidity with body mass index (BMI) of 39.0 to 39.9 in adult - long discussion about weight loss, diet, and exercise  Medication management -     Magnesium  Vitamin D deficiency -     VITAMIN D 25 Hydroxy (Vit-D Deficiency, Fractures)  Screening, anemia, deficiency, iron -     Iron,Total/Total Iron Binding Cap -     Vitamin B12  Encounter for general adult medical examination with abnormal findings  Insulin resistance Continue weight loss  Dyspnea, unspecified type Improved with weight loss/exercise  Anemia, unspecified type - monitor, continue iron supp with Vitamin C and increase green leafy veggies  HSV-2 infection Continue meds  Insomnia -     amitriptyline (ELAVIL) 50 MG tablet; Take 1 tablet (50 mg total) by mouth at bedtime as needed for sleep.  Anxiety Increase lexapro to 20mg  daily, try xanax just AS needed and elavil at night for sleep  Other orders -     fluconazole (DIFLUCAN) 150 MG tablet; Take 1 tablet (150 mg total) by mouth daily. -     triamcinolone cream (KENALOG) 0.5 %; Apply 1 application topically 2 (two) times daily   Continue diet and meds as discussed. Further disposition pending results of labs. Over 40 minutes of exam, counseling, chart review, and critical decision making was performed  Future Appointments Date Time Provider [place]  10/21/2017 9:00 AM Vicie Mutters, PA-C [id number] None     HPI 51 y.o. female  presents for CPE and 3 month follow up on hypertension, cholesterol, prediabetes, obesity, and vitamin D deficiency.   Her blood pressure has been controlled at home, today their BP is BP: (!) 146/92,  She has been following with ortho for right shoulder.  She is on lexapro 10mg , on xanax at night, having some anxiety with nausea/diarrhea, getting divorce over next year, friend committed suicide, and dog died.   She does not workout. She denies chest pain, dizziness, SOB.  She is on cholesterol medication, crestor 10mg   1 x a week and denies myalgias. Her cholesterol is at goal. The cholesterol last visit was:   Lab Results  Component Value Date   CHOL 191 03/23/2016   HDL 44 (L) 03/23/2016   LDLCALC 120 (H) 03/23/2016   TRIG 135 03/23/2016   CHOLHDL 4.3 03/23/2016    She has been working on diet and exercise for prediabetes with insulin resistance due to obesity, and denies paresthesia of the feet, polydipsia, polyuria and visual disturbances. Last A1C in the office was:  Lab Results  Component Value Date   HGBA1C 5.1 03/23/2016   Patient is on Vitamin D supplement.   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 Phineas Real BMI is Body mass index is 38.25 kg/m., she is working on diet and exercise. She has OSA and is on CPAP.  Wt Readings from Last 3 Encounters:  10/19/16 237 lb (107.5 kg)  07/22/16 241 lb (109.3 kg)  07/08/16 247 lb (112 kg)    Current Medications:  Current Outpatient Prescriptions on File Prior to Visit  Medication Sig Dispense Refill  . ALPRAZolam (XANAX) 0.5 MG tablet TAKE 1 TABLET BY MOUTH TWICE DAILY AS NEEDED WHILE FLYING 60 tablet 0  . escitalopram (LEXAPRO) 20 MG tablet TAKE ONE TABLET BY MOUTH DAILY 30 tablet 0  . etonogestrel (NEXPLANON)  68 MG IMPL implant 1 each by Subdermal route once.    . IRON PO Take by mouth daily. Reported on 05/28/2015    . losartan (COZAAR) 100 MG tablet Take 1 tablet (100 mg total) by mouth daily. 30 tablet 11  . MAGNESIUM PO Take by mouth daily. Reported on 06/27/2015    . rosuvastatin (CRESTOR) 10 MG tablet TAKE 1 TABLET (10 MG TOTAL) BY MOUTH DAILY. 30 tablet 2  . triamcinolone ointment (KENALOG) 0.1 % triamcinolone acetonide 0.1 % topical ointment    . valACYclovir (VALTREX) 500 MG tablet Take 1 tablet (500 mg total) by mouth 2 (two) times daily. 60 tablet 3  . Vitamin D, Ergocalciferol, (DRISDOL) 50000 units CAPS capsule TAKE 1 CAPSULE BY MOUTH THREE TIMES A WEEK AS DIRECTED 12 capsule PRN   Current Facility-Administered Medications on File Prior to Visit  Medication Dose Route Frequency Provider Last Rate Last Dose  . 0.9 %  sodium chloride infusion  500 mL Intravenous Continuous Armbruster, Renelda Loma, MD       Medical History:  Past Medical History:  Diagnosis Date  . Allergy   . Anemia   . Anxiety   . ASCUS (atypical squamous cells of undetermined significance) on Pap smear    neg HR HPV 09/2008, ascus 06/2009,10/2009  . HSV-2 infection   . Hypercholesteremia   . Hypertension   . OSA on CPAP   . Prediabetes   . Sleep apnea    Health Maintenance Immunization History  Administered Date(s) Administered  . Influenza Split 02/02/2011  . Influenza,inj,Quad PF,6+ Mos 11/21/2012, 11/10/2013, 11/14/2014  . Td 06/23/2005  . Tdap 07/09/2015    Tetanus: 2017 Pneumovax: N/A Prevnar 13: N/A Flu vaccine: 2016 Zostavax: N/A Pap: 09/2016 normal, Dr. Marvel Plan MGM: 09/2016 DEXA: 08/2014 Colonoscopy: 06/2016 Normal  Hpylori breath test 2016 negative EGD:N/A Echo 2007 PFT 06/2015 Last Eye Exam: Dr. Clydene Laming, yesterday Dentist: Dr. Jake Seats Orthodontist- got braces Dr. Ulyess Blossom  Patient Care Team: Unk Pinto, MD as PCP - General (Internal Medicine) Berenice Primas, MD as Referring  Physician (Orthopedic Surgery) Lelon Perla, MD as Consulting Physician (Cardiology) Phineas Real, Belinda Block, MD as Consulting Physician (Gynecology) Rolm Bookbinder, MD as Consulting Physician (Dermatology)   Allergies:  Allergies  Allergen Reactions  . Doxycycline Hives and Shortness Of Breath  . Ace Inhibitors     cough  . Adhesive [Tape]   . Augmentin [Amoxicillin-Pot Clavulanate] Nausea Only    diarrhea  . Clindamycin/Lincomycin   . Epinephrine-Lidocaine-Na Metabisulfite [Lidocaine-Epinephrine]     Has heart palpitations from the epi, wants without  . Vicodin [Hydrocodone-Acetaminophen] Itching    SURGICAL HISTORY She  has a past surgical history that includes Cesarean section; remove moles; Colposcopy; Knee surgery; Mouth surgery; Nexplanon insertion (05/22/2015); and Colonoscopy (2005?). FAMILY HISTORY Her family history includes  Breast cancer (age of onset: 28) in her maternal grandmother; Cancer in her mother; Emphysema in her maternal grandmother; Hyperlipidemia in her mother; Hypertension in her maternal grandmother and mother. SOCIAL HISTORY She  reports that she quit smoking about 17 years ago. Her smoking use included Cigarettes. She has a 3.00 pack-year smoking history. She has never used smokeless tobacco. She reports that she drinks about 6.6 - 9.0 oz of alcohol per week . She reports that she does not use drugs.  Review of Systems:  Review of Systems  Constitutional: Positive for malaise/fatigue. Negative for chills, diaphoresis, fever and weight loss.  HENT: Negative.   Eyes: Negative.   Respiratory: Negative for cough, hemoptysis, sputum production, shortness of breath and wheezing.   Cardiovascular: Negative.  Negative for chest pain, palpitations, orthopnea, claudication, leg swelling and PND.  Gastrointestinal: Negative.   Genitourinary: Negative.   Musculoskeletal: Negative for back pain, falls, joint pain, myalgias and neck pain.  Skin: Negative.    Neurological: Negative.  Negative for weakness.  Psychiatric/Behavioral: The patient is nervous/anxious.    Physical Exam: BP (!) 146/92   Pulse 64   Temp 97.9 F (36.6 C)   Ht 5\' 6"  (1.676 m)   Wt 237 lb (107.5 kg)   SpO2 98%   BMI 38.25 kg/m  Wt Readings from Last 3 Encounters:  10/19/16 237 lb (107.5 kg)  07/22/16 241 lb (109.3 kg)  07/08/16 247 lb (112 kg)   General Appearance: Well nourished, in no apparent distress. Eyes: PERRLA, EOMs, conjunctiva no swelling or erythema Sinuses: No Frontal/maxillary tenderness ENT/Mouth: Ext aud canals clear, TMs without erythema, bulging. No erythema, swelling, or exudate on post pharynx.  Tonsils not swollen or erythematous. Hearing normal.  Neck: Supple, thyroid normal.  Respiratory: Respiratory effort normal, BS equal bilaterally without rales, rhonchi, wheezing or stridor.  Cardio: RRR with no MRGs. Brisk peripheral pulses without edema.  Abdomen: Soft, + BS,  Non tender, no guarding, rebound, hernias, masses. Lymphatics: Non tender without lymphadenopathy.  Musculoskeletal: Full ROM, 5/5 strength, Normal gait Skin: Warm, dry without rashes, lesions, ecchymosis.  Neuro: Cranial nerves intact. Normal muscle tone, no cerebellar symptoms. Psych: Awake and oriented X 3, normal affect, Insight and Judgment appropriate.    Vicie Mutters, PA-C 9:10 AM [facility]

## 2016-10-19 ENCOUNTER — Encounter: Payer: Self-pay | Admitting: Physician Assistant

## 2016-10-19 ENCOUNTER — Ambulatory Visit (INDEPENDENT_AMBULATORY_CARE_PROVIDER_SITE_OTHER): Payer: BLUE CROSS/BLUE SHIELD | Admitting: Physician Assistant

## 2016-10-19 VITALS — BP 146/92 | HR 64 | Temp 97.9°F | Ht 66.0 in | Wt 237.0 lb

## 2016-10-19 DIAGNOSIS — Z0001 Encounter for general adult medical examination with abnormal findings: Secondary | ICD-10-CM

## 2016-10-19 DIAGNOSIS — I10 Essential (primary) hypertension: Secondary | ICD-10-CM | POA: Diagnosis not present

## 2016-10-19 DIAGNOSIS — E8881 Metabolic syndrome: Secondary | ICD-10-CM

## 2016-10-19 DIAGNOSIS — B009 Herpesviral infection, unspecified: Secondary | ICD-10-CM

## 2016-10-19 DIAGNOSIS — E7800 Pure hypercholesterolemia, unspecified: Secondary | ICD-10-CM | POA: Diagnosis not present

## 2016-10-19 DIAGNOSIS — R6889 Other general symptoms and signs: Secondary | ICD-10-CM

## 2016-10-19 DIAGNOSIS — R0600 Dyspnea, unspecified: Secondary | ICD-10-CM

## 2016-10-19 DIAGNOSIS — Z130 Encounter for screening for diseases of the blood and blood-forming organs and certain disorders involving the immune mechanism: Secondary | ICD-10-CM

## 2016-10-19 DIAGNOSIS — Z79899 Other long term (current) drug therapy: Secondary | ICD-10-CM | POA: Diagnosis not present

## 2016-10-19 DIAGNOSIS — G4733 Obstructive sleep apnea (adult) (pediatric): Secondary | ICD-10-CM | POA: Diagnosis not present

## 2016-10-19 DIAGNOSIS — F418 Other specified anxiety disorders: Secondary | ICD-10-CM

## 2016-10-19 DIAGNOSIS — E559 Vitamin D deficiency, unspecified: Secondary | ICD-10-CM | POA: Diagnosis not present

## 2016-10-19 DIAGNOSIS — Z6839 Body mass index (BMI) 39.0-39.9, adult: Secondary | ICD-10-CM

## 2016-10-19 DIAGNOSIS — E6609 Other obesity due to excess calories: Secondary | ICD-10-CM

## 2016-10-19 DIAGNOSIS — D649 Anemia, unspecified: Secondary | ICD-10-CM

## 2016-10-19 DIAGNOSIS — G4700 Insomnia, unspecified: Secondary | ICD-10-CM

## 2016-10-19 MED ORDER — AMITRIPTYLINE HCL 50 MG PO TABS: 50.0000 mg | ORAL_TABLET | Freq: Every evening | ORAL | 0 refills | Status: DC | PRN

## 2016-10-19 MED ORDER — FLUCONAZOLE 150 MG PO TABS: 150.0000 mg | ORAL_TABLET | Freq: Every day | ORAL | 3 refills | Status: DC

## 2016-10-19 MED ORDER — TRIAMCINOLONE ACETONIDE 0.5 % EX CREA: 1.0000 "application " | TOPICAL_CREAM | Freq: Two times a day (BID) | CUTANEOUS | 2 refills | Status: DC

## 2016-10-19 NOTE — Patient Instructions (Addendum)
Increase valsartan and lexapro for now Keep an eye on BP lexapro x 2 weeks at least to see if this helps Get on zantac or pepcid x 1-2 weeks to heal stomach    Avoid alcohol, spicy foods, NSAIDS (aleve, ibuprofen) at this time. See foods below.   Food Choices for Gastroesophageal Reflux Disease When you have gastroesophageal reflux disease (GERD), the foods you eat and your eating habits are very important. Choosing the right foods can help ease the discomfort of GERD. WHAT GENERAL GUIDELINES DO I NEED TO FOLLOW?  Choose fruits, vegetables, whole grains, low-fat dairy products, and low-fat meat, fish, and poultry.  Limit fats such as oils, salad dressings, butter, nuts, and avocado.  Keep a food diary to identify foods that cause symptoms.  Avoid foods that cause reflux. These may be different for different people.  Eat frequent small meals instead of three large meals each day.  Eat your meals slowly, in a relaxed setting.  Limit fried foods.  Cook foods using methods other than frying.  Avoid drinking alcohol.  Avoid drinking large amounts of liquids with your meals.  Avoid bending over or lying down until 2-3 hours after eating. WHAT FOODS ARE NOT RECOMMENDED? The following are some foods and drinks that may worsen your symptoms: Vegetables Tomatoes. Tomato juice. Tomato and spaghetti sauce. Chili peppers. Onion and garlic. Horseradish. Fruits Oranges, grapefruit, and lemon (fruit and juice). Meats High-fat meats, fish, and poultry. This includes hot dogs, ribs, ham, sausage, salami, and bacon. Dairy Whole milk and chocolate milk. Sour cream. Cream. Butter. Ice cream. Cream cheese.  Beverages Coffee and tea, with or without caffeine. Carbonated beverages or energy drinks. Condiments Hot sauce. Barbecue sauce.  Sweets/Desserts Chocolate and cocoa. Donuts. Peppermint and spearmint. Fats and Oils High-fat foods, including [place] fries and potato  chips. Other Vinegar. Strong spices, such as black pepper, white pepper, red pepper, cayenne, curry powder, cloves, ginger, and chili powder.  New guidelines suggest the benzodiazepines are best short term, with prolonged use they lead to physical and psychological dependence. In addition, evidence suggest that for insomnia the effectiveness wanes in 4 weeks and the risks out weight their benefits. Use of these agents have been associated with dementia, falls, motor vehicle accidents and physical addiction. Decreasing these medication have been proven to show improvements in cognition, alertness, decrease of falls and daytime sedation.   We will start a slow taper, symptoms of withdrawal include, insomnia, anxiety, irritability, sweating and stomach or intestinal symptoms like diarrhea or nausea.   11 Tips to Follow:  1. No caffeine after 3pm: Avoid beverages with caffeine (soda, tea, energy drinks, etc.) especially after 3pm. 2. Don't go to bed hungry: Have your evening meal at least 3 hrs. before going to sleep. It's fine to have a small bedtime snack such as a glass of milk and a few crackers but don't have a big meal. 3. Have a nightly routine before bed: Plan on "winding down" before you go to sleep. Begin relaxing about 1 hour before you go to bed. Try doing a quiet activity such as listening to calming music, reading a book or meditating. 4. Turn off the TV and ALL electronics including video games, tablets, laptops, etc. 1 hour before sleep, and keep them out of the bedroom. 5. Turn off your cell phone and all notifications (new email and text alerts) or even better, leave your phone outside your room while you sleep. Studies have shown that a part of your brain  continues to respond to certain lights and sounds even while you're still asleep. 6. Make your bedroom quiet, dark and cool. If you can't control the noise, try wearing earplugs or using a fan to block out other sounds. 7. Practice  relaxation techniques. Try reading a book or meditating or drain your brain by writing a list of what you need to do the next day. 8. Don't nap unless you feel sick: you'll have a better night's sleep. 9. Don't smoke, or quit if you do. Nicotine, alcohol, and marijuana can all keep you awake. Talk to your health care provider if you need help with substance use. 10. Most importantly, wake up at the same time every day (or within 1 hour of your usual wake up time) EVEN on the weekends. A regular wake up time promotes sleep hygiene and prevents sleep problems. 11. Reduce exposure to bright light in the last three hours of the day before going to sleep. Maintaining good sleep hygiene and having good sleep habits lower your risk of developing sleep problems. Getting better sleep can also improve your concentration and alertness. Try the simple steps in this guide. If you still have trouble getting enough rest, make an appointment with your health care provider.

## 2016-10-21 ENCOUNTER — Encounter: Payer: Self-pay | Admitting: Physician Assistant

## 2016-10-22 ENCOUNTER — Encounter: Payer: Self-pay | Admitting: *Deleted

## 2016-10-22 LAB — LIPID PANEL
Cholesterol: 215 mg/dL — ABNORMAL HIGH (ref <200)
HDL: 48 mg/dL — ABNORMAL LOW (ref >50)
LDL Cholesterol (Calc): 144 mg/dL (calc) — ABNORMAL HIGH
Non-HDL Cholesterol (Calc): 167 mg/dL (calc) — ABNORMAL HIGH (ref <130)
Total CHOL/HDL Ratio: 4.5 (calc) (ref <5.0)
Triglycerides: 109 mg/dL (ref <150)

## 2016-10-22 LAB — HEPATIC FUNCTION PANEL
AG Ratio: 1.9 (calc) (ref 1.0–2.5)
ALT: 16 U/L (ref 6–29)
AST: 15 U/L (ref 10–35)
Albumin: 4.5 g/dL (ref 3.6–5.1)
Alkaline phosphatase (APISO): 64 U/L (ref 33–130)
Bilirubin, Direct: 0.1 mg/dL (ref 0.0–0.2)
Globulin: 2.4 g/dL (calc) (ref 1.9–3.7)
Indirect Bilirubin: 0.5 mg/dL (calc) (ref 0.2–1.2)
Total Bilirubin: 0.6 mg/dL (ref 0.2–1.2)
Total Protein: 6.9 g/dL (ref 6.1–8.1)

## 2016-10-22 LAB — MAGNESIUM: Magnesium: 2.1 mg/dL (ref 1.5–2.5)

## 2016-10-22 LAB — CBC WITH DIFFERENTIAL/PLATELET
Basophils Absolute: 61 cells/uL (ref 0–200)
Basophils Relative: 1 %
Eosinophils Absolute: 122 cells/uL (ref 15–500)
Eosinophils Relative: 2 %
HCT: 40.9 % (ref 35.0–45.0)
Hemoglobin: 13.5 g/dL (ref 11.7–15.5)
Lymphs Abs: 1074 cells/uL (ref 850–3900)
MCH: 29.2 pg (ref 27.0–33.0)
MCHC: 33 g/dL (ref 32.0–36.0)
MCV: 88.3 fL (ref 80.0–100.0)
MPV: 11.4 fL (ref 7.5–12.5)
Monocytes Relative: 7 %
Neutro Abs: 4416 cells/uL (ref 1500–7800)
Neutrophils Relative %: 72.4 %
Platelets: 240 10*3/uL (ref 140–400)
RBC: 4.63 10*6/uL (ref 3.80–5.10)
RDW: 13 % (ref 11.0–15.0)
Total Lymphocyte: 17.6 %
WBC mixed population: 427 cells/uL (ref 200–950)
WBC: 6.1 10*3/uL (ref 3.8–10.8)

## 2016-10-22 LAB — URINALYSIS, ROUTINE W REFLEX MICROSCOPIC
Bilirubin Urine: NEGATIVE
Glucose, UA: NEGATIVE
Hgb urine dipstick: NEGATIVE
Ketones, ur: NEGATIVE
Leukocytes, UA: NEGATIVE
Nitrite: NEGATIVE
Protein, ur: NEGATIVE
Specific Gravity, Urine: 1.024 (ref 1.001–1.03)
pH: 5.5 (ref 5.0–8.0)

## 2016-10-22 LAB — BASIC METABOLIC PANEL WITH GFR
BUN: 18 mg/dL (ref 7–25)
CO2: 27 mmol/L (ref 20–32)
Calcium: 9.2 mg/dL (ref 8.6–10.4)
Chloride: 106 mmol/L (ref 98–110)
Creat: 0.88 mg/dL (ref 0.50–1.05)
GFR, Est African American: 89 mL/min/{1.73_m2} (ref >=60)
GFR, Est Non African American: 77 mL/min/{1.73_m2} (ref >=60)
Glucose, Bld: 94 mg/dL (ref 65–99)
Potassium: 4.6 mmol/L (ref 3.5–5.3)
Sodium: 140 mmol/L (ref 135–146)

## 2016-10-22 LAB — MICROALBUMIN / CREATININE URINE RATIO
Creatinine, Urine: 139 mg/dL (ref 20–320)
Microalb Creat Ratio: 1 mcg/mg creat (ref <30)
Microalb, Ur: 0.2 mg/dL

## 2016-10-22 LAB — IRON, TOTAL/TOTAL IRON BINDING CAP
%SAT: 22 % (calc) (ref 11–50)
Iron: 83 ug/dL (ref 45–160)
TIBC: 377 mcg/dL (calc) (ref 250–450)

## 2016-10-22 LAB — VITAMIN B12: Vitamin B-12: 373 pg/mL (ref 200–1100)

## 2016-10-22 LAB — VITAMIN D 25 HYDROXY (VIT D DEFICIENCY, FRACTURES): Vit D, 25-Hydroxy: 64 ng/mL (ref 30–100)

## 2016-10-22 LAB — TSH: TSH: 1.4 mIU/L

## 2016-10-26 MED ORDER — AMITRIPTYLINE HCL 10 MG PO TABS: ORAL_TABLET | ORAL | 0 refills | Status: DC

## 2016-12-06 ENCOUNTER — Other Ambulatory Visit: Payer: Self-pay | Admitting: Physician Assistant

## 2016-12-06 DIAGNOSIS — F411 Generalized anxiety disorder: Secondary | ICD-10-CM

## 2016-12-26 ENCOUNTER — Other Ambulatory Visit: Payer: Self-pay | Admitting: Physician Assistant

## 2017-01-11 ENCOUNTER — Other Ambulatory Visit: Payer: Self-pay | Admitting: Physician Assistant

## 2017-01-11 DIAGNOSIS — F411 Generalized anxiety disorder: Secondary | ICD-10-CM

## 2017-01-25 ENCOUNTER — Other Ambulatory Visit: Payer: Self-pay | Admitting: Physician Assistant

## 2017-02-04 ENCOUNTER — Ambulatory Visit: Payer: Self-pay | Admitting: Physician Assistant

## 2017-02-18 ENCOUNTER — Ambulatory Visit (INDEPENDENT_AMBULATORY_CARE_PROVIDER_SITE_OTHER): Payer: BLUE CROSS/BLUE SHIELD | Admitting: Physician Assistant

## 2017-02-18 ENCOUNTER — Encounter: Payer: Self-pay | Admitting: Physician Assistant

## 2017-02-18 VITALS — BP 126/86 | HR 81 | Temp 97.5°F | Resp 14 | Ht 66.0 in | Wt 252.8 lb

## 2017-02-18 DIAGNOSIS — Z6839 Body mass index (BMI) 39.0-39.9, adult: Secondary | ICD-10-CM | POA: Diagnosis not present

## 2017-02-18 DIAGNOSIS — E7800 Pure hypercholesterolemia, unspecified: Secondary | ICD-10-CM

## 2017-02-18 DIAGNOSIS — E6609 Other obesity due to excess calories: Secondary | ICD-10-CM

## 2017-02-18 DIAGNOSIS — Z79899 Other long term (current) drug therapy: Secondary | ICD-10-CM

## 2017-02-18 DIAGNOSIS — F3341 Major depressive disorder, recurrent, in partial remission: Secondary | ICD-10-CM | POA: Diagnosis not present

## 2017-02-18 DIAGNOSIS — I10 Essential (primary) hypertension: Secondary | ICD-10-CM | POA: Diagnosis not present

## 2017-02-18 MED ORDER — BUPROPION HCL ER (XL) 150 MG PO TB24: 150.0000 mg | ORAL_TABLET | ORAL | 2 refills | Status: DC

## 2017-02-18 MED ORDER — FLUCONAZOLE 150 MG PO TABS: 150.0000 mg | ORAL_TABLET | Freq: Every day | ORAL | 3 refills | Status: DC

## 2017-02-18 NOTE — Patient Instructions (Addendum)
Continue 1/2 of lexapro and add on wellbutrin 150mg  XL, can stop when the sun starts to shine  If this is not helping let me know  Increase veggies, increase water  Check to see if you have triamcinolone cream, use this externally for hemorrhoids  About Hemorrhoids  Hemorrhoids are swollen veins in the lower rectum and anus.  Also called piles, hemorrhoids are a common problem.  Hemorrhoids may be internal (inside the rectum) or external (around the anus).  Internal Hemorrhoids  Internal hemorrhoids are often painless, but they rarely cause bleeding.  The internal veins may stretch and fall down (prolapse) through the anus to the outside of the body.  The veins may then become irritated and painful.  External Hemorrhoids  External hemorrhoids can be easily seen or felt around the anal opening.  They are under the skin around the anus.  When the swollen veins are scratched or broken by straining, rubbing or wiping they sometimes bleed.  How Hemorrhoids Occur  Veins in the rectum and around the anus tend to swell under pressure.  Hemorrhoids can result from increased pressure in the veins of your anus or rectum.  Some sources of pressure are:   Straining to have a bowel movement because of constipation  Waiting too long to have a bowel movement  Coughing and sneezing often  Sitting for extended periods of time, including on the toilet  Diarrhea  Obesity  Trauma or injury to the anus  Some liver diseases  Stress  Family history of hemorrhoids  Pregnancy  Pregnant women should try to avoid becoming constipated, because they are more likely to have hemorrhoids during pregnancy.  In the last trimester of pregnancy, the enlarged uterus may press on blood vessels and causes hemorrhoids.  In addition, the strain of childbirth sometimes causes hemorrhoids after the birth.  Symptoms of Hemorrhoids  Some symptoms of hemorrhoids include:  Swelling and/or a tender lump around  the anus  Itching, mild burning and bleeding around the anus  Painful bowel movements with or without constipation  Bright red blood covering the stool, on toilet paper or in the toilet bowel.   Symptoms usually go away within a few days.  Always talk to your doctor about any bleeding to make sure it is not from some other causes.  Diagnosing and Treating Hemorrhoids  Diagnosis is made by an examination by your healthcare provider.  Special test can be performed by your doctor.    Most cases of hemorrhoids can be treated with:  High-fiber diet: Eat more high-fiber foods, which help prevent constipation.  Ask for more detailed fiber information on types and sources of fiber from your healthcare provider.  Fluids: Drink plenty of water.  This helps soften bowel movements so they are easier to pass.  Sitz baths and cold packs: Sitting in lukewarm water two or three times a day for 15 minutes cleases the anal area and may relieve discomfort.  If the water is too hot, swelling around the anus will get worse.  Placing a cloth-covered ice pack on the anus for ten minutes four times a day can also help reduce selling.  Gently pushing a prolapsed hemorrhoid back inside after the bath or ice pack can be helpful.  Medications: For mild discomfort, your healthcare provider may suggest over-the-counter pain medication or prescribe a cream or ointment for topical use.  The cream may contain witch hazel, zinc oxide or petroleum jelly.  Medicated suppositories are also a treatment option.  Always consult your doctor before applying medications or creams.  Procedures and surgeries: There are also a number of procedures and surgeries to shrink or remove hemorrhoids in more serious cases.  Talk to your physician about these options.  You can often prevent hemorrhoids or keep them from becoming worse by maintaining a healthy lifestyle.  Eat a fiber-rich diet of fruits, vegetables and whole grains.  Also, drink  plenty of water and exercise regularly.   2007, [employer] Doc.30

## 2017-02-18 NOTE — Progress Notes (Signed)
Assessment and Plan:   Hypertension - monitor blood pressure at home. Continue DASH diet.  Reminder to go to the ER if any CP, SOB, nausea, dizziness, severe HA, changes vision/speech, left arm numbness and tingling and jaw pain.  Cholesterol -Continue diet and exercise. Check cholesterol.    Prediabetes  -Continue diet and exercise. Check A1C  Vitamin D Def - check level and continue medications.   Obesity morbid - follow up 3 months for progress monitoring - increase veggies, decrease carbs - long discussion about weight loss, diet, and exercise  Depression, major, recurrent, in partial remission (HCC) -     buPROPion (WELLBUTRIN XL) 150 MG 24 hr tablet; Take 1 tablet (150 mg total) by mouth every morning. Add on to 10mg  lexapro, may help with weight loss  Other orders -     fluconazole (DIFLUCAN) 150 MG tablet; Take 1 tablet (150 mg total) by mouth daily.     Continue diet and meds as discussed. Further disposition pending results of labs. Over 30 minutes of exam, counseling, chart review, and critical decision making was performed  Future Appointments  Date Time Provider [place]  05/24/2017 11:15 AM Vicie Mutters, PA-C [facility] None  10/21/2017  9:00 AM Vicie Mutters, PA-C [facility] None     HPI 51 y.o. female  presents for 3 month follow up on hypertension, cholesterol, prediabetes, obesity, and vitamin D deficiency.   Her blood pressure has been controlled at home,  today their BP is BP: 126/86,  She is on lexapro 20mg  1/2 a pill a day and having some depression, she is on xanax 3 x a week. She has been very depressed, she is about to finalize her divorce, parents are here and suppose to be helping but they have dementia in there 74's.   She does not workout. She denies chest pain, dizziness, SOB.   She is not on cholesterol medication, has been off but did take on this week due to poor eating habits. Her cholesterol is at goal. The cholesterol last  visit was:   Lab Results  Component Value Date   CHOL 215 (H) 10/19/2016   HDL 48 (L) 10/19/2016   LDLCALC 120 (H) 03/23/2016   TRIG 109 10/19/2016   CHOLHDL 4.5 10/19/2016    She has been working on diet and exercise for prediabetes with insulin resistance due to obesity, and denies paresthesia of the feet, polydipsia, polyuria and visual disturbances. Last A1C in the office was:  Lab Results  Component Value Date   HGBA1C 5.1 03/23/2016   Patient is on Vitamin D supplement.   Lab Results  Component Value Date   VD25OH 64 10/19/2016     BMI is Body mass index is 40.8 kg/m., she is working on diet and exercise. Wt Readings from Last 3 Encounters:  02/18/17 252 lb 12.8 oz (114.7 kg)  10/19/16 237 lb (107.5 kg)  07/22/16 241 lb (109.3 kg)    Current Medications:  Current Outpatient Medications on File Prior to Visit  Medication Sig Dispense Refill  . ALPRAZolam (XANAX) 0.5 MG tablet TAKE 1 TABLET BY MOUTH TWICE DAILY AS NEEDED WHEN FLYING 30 tablet 0  . amitriptyline (ELAVIL) 10 MG tablet TAKE ONE TO TWO TABLETS BY MOUTH AT BEDTIME FOR SLEEP 180 tablet 1  . escitalopram (LEXAPRO) 20 MG tablet TAKE ONE TABLET BY MOUTH DAILY 90 tablet 1  . etonogestrel (NEXPLANON) 68 MG IMPL implant 1 each by Subdermal route once.    . IRON PO Take  by mouth daily. Reported on 05/28/2015    . losartan (COZAAR) 100 MG tablet Take 1 tablet (100 mg total) by mouth daily. 30 tablet 11  . MAGNESIUM PO Take by mouth daily. Reported on 06/27/2015    . promethazine (PHENERGAN) 25 MG tablet TAKE 1 TABLET (25 MG TOTAL) BY MOUTH EVERY 6 (SIX) HOURS AS NEEDED FOR NAUSEA OR VOMITING. MAX: 4 TABLETS PER DAY 30 tablet 0  . rosuvastatin (CRESTOR) 10 MG tablet TAKE 1 TABLET (10 MG TOTAL) BY MOUTH DAILY. 30 tablet 2  . valACYclovir (VALTREX) 500 MG tablet Take 1 tablet (500 mg total) by mouth 2 (two) times daily. 60 tablet 3  . Vitamin D, Ergocalciferol, (DRISDOL) 50000 units CAPS capsule TAKE 1 CAPSULE BY MOUTH THREE  TIMES A WEEK AS DIRECTED 12 capsule PRN   Current Facility-Administered Medications on File Prior to Visit  Medication Dose Route Frequency Provider Last Rate Last Dose  . 0.9 %  sodium chloride infusion  500 mL Intravenous Continuous Armbruster, Carlota Raspberry, MD       Medical History:  Past Medical History:  Diagnosis Date  . Allergy   . Anemia   . Anxiety   . ASCUS (atypical squamous cells of undetermined significance) on Pap smear    neg HR HPV 09/2008, ascus 06/2009,10/2009  . HSV-2 infection   . Hypercholesteremia   . Hypertension   . OSA on CPAP   . Prediabetes   . Sleep apnea    Allergies:  Allergies  Allergen Reactions  . Doxycycline Hives and Shortness Of Breath  . Ace Inhibitors     cough  . Adhesive [Tape]   . Augmentin [Amoxicillin-Pot Clavulanate] Nausea Only    diarrhea  . Clindamycin/Lincomycin   . Epinephrine-Lidocaine-Na Metabisulfite [Lidocaine-Epinephrine]     Has heart palpitations from the epi, wants without  . Vicodin [Hydrocodone-Acetaminophen] Itching    Review of Systems:  Review of Systems  Constitutional: Positive for malaise/fatigue. Negative for chills, diaphoresis, fever and weight loss.  HENT: Negative.   Eyes: Negative.   Respiratory: Negative for cough, hemoptysis, sputum production, shortness of breath and wheezing.   Cardiovascular: Negative.  Negative for chest pain, palpitations, orthopnea, claudication, leg swelling and PND.  Gastrointestinal: Negative.   Genitourinary: Negative.   Musculoskeletal: Negative for back pain, falls, joint pain, myalgias and neck pain.  Skin: Negative.   Neurological: Negative.  Negative for weakness.  Psychiatric/Behavioral: Negative.     Family history- Review and unchanged Social history- Review and unchanged Physical Exam: BP 126/86   Pulse 81   Temp (!) 97.5 F (36.4 C)   Resp 14   Ht 5\' 6"  (1.676 m)   Wt 252 lb 12.8 oz (114.7 kg)   SpO2 97%   BMI 40.80 kg/m  Wt Readings from Last 3  Encounters:  02/18/17 252 lb 12.8 oz (114.7 kg)  10/19/16 237 lb (107.5 kg)  07/22/16 241 lb (109.3 kg)   General Appearance: Well nourished, in no apparent distress. Eyes: PERRLA, EOMs, conjunctiva no swelling or erythema Sinuses: No Frontal/maxillary tenderness ENT/Mouth: Ext aud canals clear, TMs without erythema, bulging. No erythema, swelling, or exudate on post pharynx.  Tonsils not swollen or erythematous. Hearing normal.  Neck: Supple, thyroid normal.  Respiratory: Respiratory effort normal, BS equal bilaterally without rales, rhonchi, wheezing or stridor.  Cardio: RRR with no MRGs. Brisk peripheral pulses without edema.  Abdomen: Soft, + BS,  Non tender, no guarding, rebound, hernias, masses. Lymphatics: Non tender without lymphadenopathy.  Musculoskeletal: Full ROM,  5/5 strength, Normal gait Skin: Warm, dry without rashes, lesions, ecchymosis.  Neuro: Cranial nerves intact. Normal muscle tone, no cerebellar symptoms. Psych: Awake and oriented X 3, normal affect, Insight and Judgment appropriate.    Vicie Mutters, PA-C 3:55 PM [facility]

## 2017-02-19 LAB — HEPATIC FUNCTION PANEL
AG Ratio: 1.8 (calc) (ref 1.0–2.5)
ALT: 13 U/L (ref 6–29)
AST: 15 U/L (ref 10–35)
Albumin: 4.4 g/dL (ref 3.6–5.1)
Alkaline phosphatase (APISO): 71 U/L (ref 33–130)
Bilirubin, Direct: 0.1 mg/dL (ref 0.0–0.2)
Globulin: 2.4 g/dL (calc) (ref 1.9–3.7)
Indirect Bilirubin: 0.4 mg/dL (calc) (ref 0.2–1.2)
Total Bilirubin: 0.5 mg/dL (ref 0.2–1.2)
Total Protein: 6.8 g/dL (ref 6.1–8.1)

## 2017-02-19 LAB — CBC WITH DIFFERENTIAL/PLATELET
Basophils Absolute: 83 cells/uL (ref 0–200)
Basophils Relative: 1.4 %
Eosinophils Absolute: 83 cells/uL (ref 15–500)
Eosinophils Relative: 1.4 %
HCT: 39.3 % (ref 35.0–45.0)
Hemoglobin: 13 g/dL (ref 11.7–15.5)
Lymphs Abs: 1587 cells/uL (ref 850–3900)
MCH: 29 pg (ref 27.0–33.0)
MCHC: 33.1 g/dL (ref 32.0–36.0)
MCV: 87.5 fL (ref 80.0–100.0)
MPV: 10.8 fL (ref 7.5–12.5)
Monocytes Relative: 6.8 %
Neutro Abs: 3747 cells/uL (ref 1500–7800)
Neutrophils Relative %: 63.5 %
Platelets: 241 10*3/uL (ref 140–400)
RBC: 4.49 10*6/uL (ref 3.80–5.10)
RDW: 12.8 % (ref 11.0–15.0)
Total Lymphocyte: 26.9 %
WBC mixed population: 401 cells/uL (ref 200–950)
WBC: 5.9 10*3/uL (ref 3.8–10.8)

## 2017-02-19 LAB — BASIC METABOLIC PANEL WITH GFR
BUN: 16 mg/dL (ref 7–25)
CO2: 28 mmol/L (ref 20–32)
Calcium: 9.2 mg/dL (ref 8.6–10.4)
Chloride: 105 mmol/L (ref 98–110)
Creat: 0.96 mg/dL (ref 0.50–1.05)
GFR, Est African American: 80 mL/min/{1.73_m2} (ref >=60)
GFR, Est Non African American: 69 mL/min/{1.73_m2} (ref >=60)
Glucose, Bld: 89 mg/dL (ref 65–99)
Potassium: 4.8 mmol/L (ref 3.5–5.3)
Sodium: 139 mmol/L (ref 135–146)

## 2017-02-19 LAB — LIPID PANEL
Cholesterol: 236 mg/dL — ABNORMAL HIGH (ref <200)
HDL: 54 mg/dL (ref >50)
LDL Cholesterol (Calc): 162 mg/dL (calc) — ABNORMAL HIGH
Non-HDL Cholesterol (Calc): 182 mg/dL (calc) — ABNORMAL HIGH (ref <130)
Total CHOL/HDL Ratio: 4.4 (calc) (ref <5.0)
Triglycerides: 96 mg/dL (ref <150)

## 2017-02-19 LAB — TSH: TSH: 1.53 mIU/L

## 2017-03-13 ENCOUNTER — Other Ambulatory Visit: Payer: Self-pay | Admitting: Internal Medicine

## 2017-03-13 DIAGNOSIS — F411 Generalized anxiety disorder: Secondary | ICD-10-CM

## 2017-04-03 ENCOUNTER — Other Ambulatory Visit: Payer: Self-pay | Admitting: Physician Assistant

## 2017-05-04 ENCOUNTER — Other Ambulatory Visit: Payer: Self-pay | Admitting: Physician Assistant

## 2017-05-04 DIAGNOSIS — F411 Generalized anxiety disorder: Secondary | ICD-10-CM

## 2017-05-20 ENCOUNTER — Other Ambulatory Visit: Payer: Self-pay | Admitting: Physician Assistant

## 2017-05-20 DIAGNOSIS — Z6839 Body mass index (BMI) 39.0-39.9, adult: Principal | ICD-10-CM

## 2017-05-20 DIAGNOSIS — F3341 Major depressive disorder, recurrent, in partial remission: Secondary | ICD-10-CM

## 2017-05-20 DIAGNOSIS — E6609 Other obesity due to excess calories: Secondary | ICD-10-CM

## 2017-05-20 NOTE — Progress Notes (Deleted)
Assessment and Plan:   Hypertension - monitor blood pressure at home. Continue DASH diet.  Reminder to go to the ER if any CP, SOB, nausea, dizziness, severe HA, changes vision/speech, left arm numbness and tingling and jaw pain.  Cholesterol -Continue diet and exercise. Check cholesterol.    Prediabetes  -Continue diet and exercise. Check A1C  Vitamin D Def - check level and continue medications.   Obesity morbid - follow up 3 months for progress monitoring - increase veggies, decrease carbs - long discussion about weight loss, diet, and exercise  Depression, major, recurrent, in partial remission (HCC) -     buPROPion (WELLBUTRIN XL) 150 MG 24 hr tablet; Take 1 tablet (150 mg total) by mouth every morning. Add on to 10mg  lexapro, may help with weight loss  Other orders -     fluconazole (DIFLUCAN) 150 MG tablet; Take 1 tablet (150 mg total) by mouth daily.     Continue diet and meds as discussed. Further disposition pending results of labs. Over 30 minutes of exam, counseling, chart review, and critical decision making was performed  Future Appointments  Date Time Provider [place]  05/24/2017 11:15 AM Vicie Mutters, PA-C [facility] None  10/21/2017  9:00 AM Vicie Mutters, PA-C [facility] None     HPI 52 y.o. female  presents for 3 month follow up on hypertension, cholesterol, prediabetes, obesity, and vitamin D deficiency.   Her blood pressure has been controlled at home,  today their BP is  ,  She is on lexapro 20mg  1/2 a pill a day and having some depression, she is on xanax 3 x a week. She has been very depressed, she is about to finalize her divorce, parents are here and suppose to be helping but they have dementia in there 68's.   She does not workout. She denies chest pain, dizziness, SOB.   She is not on cholesterol medication, has been off but did take on this week due to poor eating habits. Her cholesterol is at goal. The cholesterol last visit was:    Lab Results  Component Value Date   CHOL 236 (H) 02/18/2017   HDL 54 02/18/2017   LDLCALC 162 (H) 02/18/2017   TRIG 96 02/18/2017   CHOLHDL 4.4 02/18/2017    She has been working on diet and exercise for prediabetes with insulin resistance due to obesity, and denies paresthesia of the feet, polydipsia, polyuria and visual disturbances. Last A1C in the office was:  Lab Results  Component Value Date   HGBA1C 5.1 03/23/2016   Patient is on Vitamin D supplement.   Lab Results  Component Value Date   VD25OH 64 10/19/2016     BMI is There is no height or weight on file to calculate BMI., she is working on diet and exercise. Wt Readings from Last 3 Encounters:  02/18/17 252 lb 12.8 oz (114.7 kg)  10/19/16 237 lb (107.5 kg)  07/22/16 241 lb (109.3 kg)    Current Medications:  Current Outpatient Medications on File Prior to Visit  Medication Sig Dispense Refill  . ALPRAZolam (XANAX) 0.5 MG tablet TAKE 1 TABLET BY MOUTH TWICE DAILY AS NEEDED WHEN FLYING 30 tablet 0  . amitriptyline (ELAVIL) 10 MG tablet TAKE ONE TO TWO TABLETS BY MOUTH AT BEDTIME FOR SLEEP 180 tablet 1  . buPROPion (WELLBUTRIN XL) 150 MG 24 hr tablet TAKE ONE TABLET BY MOUTH EVERY MORNING 30 tablet 1  . escitalopram (LEXAPRO) 20 MG tablet TAKE ONE TABLET BY MOUTH DAILY  90 tablet 1  . etonogestrel (NEXPLANON) 68 MG IMPL implant 1 each by Subdermal route once.    . fluconazole (DIFLUCAN) 150 MG tablet Take 1 tablet (150 mg total) by mouth daily. 1 tablet 3  . IRON PO Take by mouth daily. Reported on 05/28/2015    . losartan (COZAAR) 100 MG tablet Take 1 tablet (100 mg total) by mouth daily. 30 tablet 11  . MAGNESIUM PO Take by mouth daily. Reported on 06/27/2015    . promethazine (PHENERGAN) 25 MG tablet TAKE 1 TABLET (25 MG TOTAL) BY MOUTH EVERY 6 (SIX) HOURS AS NEEDED FOR NAUSEA OR VOMITING. MAX: 4 TABLETS PER DAY 30 tablet 0  . rosuvastatin (CRESTOR) 10 MG tablet TAKE ONE TABLET BY MOUTH DAILY 90 tablet 1  . valACYclovir  (VALTREX) 500 MG tablet TAKE ONE TABLET BY MOUTH TWICE A DAY 180 tablet 1  . Vitamin D, Ergocalciferol, (DRISDOL) 50000 units CAPS capsule TAKE 1 CAPSULE BY MOUTH THREE TIMES A WEEK AS DIRECTED 12 capsule PRN   Current Facility-Administered Medications on File Prior to Visit  Medication Dose Route Frequency Provider Last Rate Last Dose  . 0.9 %  sodium chloride infusion  500 mL Intravenous Continuous Armbruster, Carlota Raspberry, MD       Medical History:  Past Medical History:  Diagnosis Date  . Allergy   . Anemia   . Anxiety   . ASCUS (atypical squamous cells of undetermined significance) on Pap smear    neg HR HPV 09/2008, ascus 06/2009,10/2009  . HSV-2 infection   . Hypercholesteremia   . Hypertension   . OSA on CPAP   . Prediabetes   . Sleep apnea    Allergies:  Allergies  Allergen Reactions  . Doxycycline Hives and Shortness Of Breath  . Ace Inhibitors     cough  . Adhesive [Tape]   . Augmentin [Amoxicillin-Pot Clavulanate] Nausea Only    diarrhea  . Clindamycin/Lincomycin   . Epinephrine-Lidocaine-Na Metabisulfite [Lidocaine-Epinephrine]     Has heart palpitations from the epi, wants without  . Vicodin [Hydrocodone-Acetaminophen] Itching    Review of Systems:  Review of Systems  Constitutional: Positive for malaise/fatigue. Negative for chills, diaphoresis, fever and weight loss.  HENT: Negative.   Eyes: Negative.   Respiratory: Negative for cough, hemoptysis, sputum production, shortness of breath and wheezing.   Cardiovascular: Negative.  Negative for chest pain, palpitations, orthopnea, claudication, leg swelling and PND.  Gastrointestinal: Negative.   Genitourinary: Negative.   Musculoskeletal: Negative for back pain, falls, joint pain, myalgias and neck pain.  Skin: Negative.   Neurological: Negative.  Negative for weakness.  Psychiatric/Behavioral: Negative.     Family history- Review and unchanged Social history- Review and unchanged Physical Exam: There were  no vitals taken for this visit. Wt Readings from Last 3 Encounters:  02/18/17 252 lb 12.8 oz (114.7 kg)  10/19/16 237 lb (107.5 kg)  07/22/16 241 lb (109.3 kg)   General Appearance: Well nourished, in no apparent distress. Eyes: PERRLA, EOMs, conjunctiva no swelling or erythema Sinuses: No Frontal/maxillary tenderness ENT/Mouth: Ext aud canals clear, TMs without erythema, bulging. No erythema, swelling, or exudate on post pharynx.  Tonsils not swollen or erythematous. Hearing normal.  Neck: Supple, thyroid normal.  Respiratory: Respiratory effort normal, BS equal bilaterally without rales, rhonchi, wheezing or stridor.  Cardio: RRR with no MRGs. Brisk peripheral pulses without edema.  Abdomen: Soft, + BS,  Non tender, no guarding, rebound, hernias, masses. Lymphatics: Non tender without lymphadenopathy.  Musculoskeletal: Full ROM,  5/5 strength, Normal gait Skin: Warm, dry without rashes, lesions, ecchymosis.  Neuro: Cranial nerves intact. Normal muscle tone, no cerebellar symptoms. Psych: Awake and oriented X 3, normal affect, Insight and Judgment appropriate.    Vicie Mutters, PA-C 8:46 AM [facility]

## 2017-05-24 ENCOUNTER — Ambulatory Visit: Payer: Self-pay | Admitting: Physician Assistant

## 2017-06-10 ENCOUNTER — Other Ambulatory Visit: Payer: Self-pay | Admitting: Adult Health

## 2017-06-10 MED ORDER — TELMISARTAN 80 MG PO TABS: ORAL_TABLET | ORAL | 1 refills | Status: DC

## 2017-06-10 NOTE — Progress Notes (Signed)
Patient called to report current BP therapy - patient reports valsartan, med list lists losartan - has been recalled and requesting alternate agent. Will send in prescription for telmisartan 80 mg tabs, to take 1/2 -1 tab daily to maintain BP <130/80.

## 2017-06-14 ENCOUNTER — Other Ambulatory Visit: Payer: Self-pay | Admitting: Physician Assistant

## 2017-06-14 ENCOUNTER — Encounter (INDEPENDENT_AMBULATORY_CARE_PROVIDER_SITE_OTHER): Payer: Self-pay

## 2017-06-14 MED ORDER — IRBESARTAN 150 MG PO TABS: 150.0000 mg | ORAL_TABLET | Freq: Every day | ORAL | 11 refills | Status: DC

## 2017-07-14 ENCOUNTER — Ambulatory Visit: Payer: Self-pay | Admitting: Physician Assistant

## 2017-07-21 ENCOUNTER — Other Ambulatory Visit: Payer: Self-pay | Admitting: Adult Health

## 2017-07-21 DIAGNOSIS — Z6839 Body mass index (BMI) 39.0-39.9, adult: Principal | ICD-10-CM

## 2017-07-21 DIAGNOSIS — F3341 Major depressive disorder, recurrent, in partial remission: Secondary | ICD-10-CM

## 2017-07-21 DIAGNOSIS — E6609 Other obesity due to excess calories: Secondary | ICD-10-CM

## 2017-08-16 ENCOUNTER — Other Ambulatory Visit: Payer: Self-pay | Admitting: Physician Assistant

## 2017-08-16 DIAGNOSIS — F411 Generalized anxiety disorder: Secondary | ICD-10-CM

## 2017-09-22 ENCOUNTER — Other Ambulatory Visit: Payer: Self-pay | Admitting: Internal Medicine

## 2017-09-22 DIAGNOSIS — F411 Generalized anxiety disorder: Secondary | ICD-10-CM

## 2017-10-21 ENCOUNTER — Encounter: Payer: Self-pay | Admitting: Physician Assistant

## 2017-10-25 ENCOUNTER — Other Ambulatory Visit: Payer: Self-pay | Admitting: Adult Health

## 2017-10-25 DIAGNOSIS — F411 Generalized anxiety disorder: Secondary | ICD-10-CM

## 2017-10-27 ENCOUNTER — Other Ambulatory Visit: Payer: Self-pay | Admitting: Adult Health

## 2017-10-27 DIAGNOSIS — F411 Generalized anxiety disorder: Secondary | ICD-10-CM

## 2017-11-04 ENCOUNTER — Other Ambulatory Visit: Payer: Self-pay | Admitting: Adult Health

## 2017-11-29 ENCOUNTER — Encounter: Payer: Self-pay | Admitting: Physician Assistant

## 2017-11-29 DIAGNOSIS — F3341 Major depressive disorder, recurrent, in partial remission: Secondary | ICD-10-CM | POA: Insufficient documentation

## 2017-11-29 NOTE — Progress Notes (Signed)
COMPLETE PHYSICAL  Assessment and Plan:   Essential hypertension - continue medications, DASH diet, exercise and monitor at home. Call if greater than 130/80.  -     CBC with Differential/Platelet -     BASIC METABOLIC PANEL WITH GFR -     Hepatic function panel -     TSH -     Urinalysis, Routine w reflex microscopic -     Microalbumin / creatinine urine ratio -     EKG 12-Lead  OSA (obstructive sleep apnea) Continue CPAP  Hypercholesteremia -continue medications, check lipids, decrease fatty foods, increase activity.  -     Lipid panel  Class 2 obesity due to excess calories without serious comorbidity with body mass index (BMI) of 39.0 to 39.9 in adult - long discussion about weight loss, diet, and exercise  Medication management -     Magnesium  Vitamin D deficiency -     VITAMIN D 25 Hydroxy (Vit-D Deficiency, Fractures)  Screening, anemia, deficiency, iron -     Iron,Total/Total Iron Binding Cap -     Vitamin B12  Encounter for general adult medical examination with abnormal findings  Insulin resistance Continue weight loss  Anemia, unspecified type - monitor, continue iron supp with Vitamin C and increase green leafy veggies  HSV-2 infection Continue meds  Insomnia -     amitriptyline (ELAVIL) 50 MG tablet; Take 1 tablet (50 mg total) by mouth at bedtime as needed for sleep.  Anxiety Continue medications  Left arm numbness/tingling Some in her right arm as well, will go up her arm.  Continue wearing carpal tunnel brace, may need EMG- ? From her neck No CP, SOB, very mechanical.   Depression Continue medications  Continue diet and meds as discussed. Further disposition pending results of labs. Over 40 minutes of exam, counseling, chart review, and critical decision making was performed  Future Appointments  Date Time Provider [place]  12/12/2018  3:00 PM Vicie Mutters, PA-C [facility] None     HPI 52 y.o. female  presents for CPE  and 3 month follow up on hypertension, cholesterol, prediabetes, obesity, and vitamin D deficiency.   Her blood pressure has been controlled at home [place], today their BP is BP: (!) 140/92,  She has been following with ortho for right shoulder. SHE HAS BEEN HAVING NUMBNESS IN HER RIGHT WRIST- STARTED TO WEAR BRACE X 1 WEEK BUT HAS NOT IMPROVED. DENIES NECK/SHOULDER ISSUES.  She is on lexapro 20mg , on xanax at night, having some anxiety with nausea/diarrhea, getting divorced, dad is dying in hospital and mom is not doing well but she states she is actually doing well emotionally.   She does not workout. She denies chest pain, dizziness, SOB.  She is on cholesterol medication, crestor 10mg  2-3 X A WEEK and denies myalgias. Her cholesterol is at goal. The cholesterol last visit was:   Lab Results  Component Value Date   CHOL 236 (H) 02/18/2017   HDL 54 02/18/2017   LDLCALC 162 (H) 02/18/2017   TRIG 96 02/18/2017   CHOLHDL 4.4 02/18/2017    She has been working on diet and exercise for prediabetes with insulin resistance due to obesity, and denies paresthesia of the feet, polydipsia, polyuria and visual disturbances. Last A1C in the office was:  Lab Results  Component Value Date   HGBA1C 5.1 03/23/2016   Patient is on Vitamin D supplement.   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 Phineas Real BMI is Body mass index is 43.6 kg/m., she is working on diet and exercise. She has OSA and is on CPAP.  Wt Readings from Last 3 Encounters:  11/30/17 262 lb (118.8 kg)  02/18/17 252 lb 12.8 oz (114.7 kg)  10/19/16 237 lb (107.5 kg)    Current Medications:  Current Outpatient Medications on File Prior to Visit  Medication Sig Dispense Refill  . ALPRAZolam (XANAX) 0.5 MG tablet TAKE 1 TABLET BY MOUTH TWICE DAILY AS NEEDED  WHEN FLYING 30 tablet 0  . amitriptyline (ELAVIL) 10 MG tablet TAKE 1 TO 2 TABLETS BY MOUTH AT BEDTIME AS NEEDED FOR SLEEP 60 tablet 0  . buPROPion (WELLBUTRIN XL) 150 MG 24 hr tablet TAKE ONE TABLET BY MOUTH EVERY MORNING 90 tablet 1  . escitalopram (LEXAPRO) 20 MG tablet TAKE ONE TABLET BY MOUTH DAILY 30 tablet 2  . etonogestrel (NEXPLANON) 68 MG IMPL implant 1 each by Subdermal route once.    . IRON PO Take by mouth daily. Reported on 05/28/2015    . MAGNESIUM PO Take by mouth daily. Reported on 06/27/2015    . promethazine (PHENERGAN) 25 MG tablet TAKE 1 TABLET (25 MG TOTAL) BY MOUTH EVERY 6 (SIX) HOURS AS NEEDED FOR NAUSEA OR VOMITING. MAX: 4 TABLETS PER DAY 30 tablet 0  . rosuvastatin (CRESTOR) 10 MG tablet TAKE ONE TABLET BY MOUTH DAILY 90 tablet 1  . valACYclovir (VALTREX) 500 MG tablet TAKE ONE TABLET BY MOUTH TWICE A DAY 180 tablet 1  . Vitamin D, Ergocalciferol, (DRISDOL) 50000 units CAPS capsule TAKE ONE CAPSULE BY MOUTH THREE TIMES A WEEK AS DIRECTED 12 capsule PRN  . fluconazole (DIFLUCAN) 150 MG tablet Take 1 tablet (150 mg total) by mouth daily. (Patient not taking: Reported on 11/30/2017) 1 tablet 3   Current Facility-Administered Medications on File Prior to Visit  Medication Dose Route Frequency Provider Last Rate Last Dose  . 0.9 %  sodium chloride infusion  500 mL Intravenous Continuous Armbruster, Carlota Raspberry, MD       Medical History:  Past Medical History:  Diagnosis Date  . Allergy   . Anemia   . Anxiety   . ASCUS (atypical squamous cells of undetermined significance) on Pap smear    neg HR HPV 09/2008, ascus 06/2009,10/2009  . HSV-2 infection   . Hypercholesteremia   . Hypertension   . OSA on CPAP   . Pes planus of both feet 06/20/2013  . Prediabetes   . Sleep apnea    Health Maintenance Immunization History  Administered Date(s) Administered  . Influenza Split 02/02/2011  . Influenza,inj,Quad PF,6+ Mos 11/21/2012, 11/10/2013, 11/14/2014  . Td 06/23/2005  . Tdap  07/09/2015    Tetanus: 2017 Pneumovax: N/A Prevnar 13: N/A Flu vaccine: 2019 today Zostavax: N/A Pap: 09/2016 normal, Dr. Marvel Plan MGM: 09/2016 DEXA: 08/2014 Colonoscopy: 06/2016 Normal  Hpylori breath test 2016 negative EGD:N/A Echo 2007 PFT 06/2015 Last Eye Exam: Dr. Clydene Laming, yesterday Dentist: Dr. Jake Seats Orthodontist- got braces Dr. Ulyess Blossom  Patient Care Team: Unk Pinto, MD as PCP - General (Internal Medicine) Berenice Primas, MD as Referring Physician (Orthopedic Surgery) Lelon Perla, MD as Consulting Physician (Cardiology) Phineas Real, Belinda Block, MD as Consulting Physician (Gynecology) Rolm Bookbinder, MD as Consulting Physician (Dermatology)   Allergies:  Allergies  Allergen Reactions  .  Doxycycline Hives and Shortness Of Breath  . Ace Inhibitors     cough  . Adhesive [Tape]   . Augmentin [Amoxicillin-Pot Clavulanate] Nausea Only    diarrhea  . Clindamycin/Lincomycin   . Epinephrine-Lidocaine-Na Metabisulfite [Lidocaine-Epinephrine]     Has heart palpitations from the epi, wants without  . Vicodin [Hydrocodone-Acetaminophen] Itching    SURGICAL HISTORY She  has a past surgical history that includes Cesarean section; remove moles; Colposcopy; Knee surgery; Mouth surgery; Nexplanon insertion (05/22/2015); and Colonoscopy (2005?). FAMILY HISTORY Her family history includes Breast cancer (age of onset: 89) in her maternal grandmother; Cancer in her mother; Emphysema in her maternal grandmother; Hyperlipidemia in her mother; Hypertension in her maternal grandmother and mother. SOCIAL HISTORY She  reports that she quit smoking about 18 years ago. Her smoking use included cigarettes. She has a 3.00 pack-year smoking history. She has never used smokeless tobacco. She reports that she drinks about 11.0 - 15.0 standard drinks of alcohol per week. She reports that she does not use drugs.  Review of Systems:  Review of Systems  Constitutional: Positive for  malaise/fatigue. Negative for chills, diaphoresis, fever and weight loss.  HENT: Negative.   Eyes: Negative.   Respiratory: Negative for cough, hemoptysis, sputum production, shortness of breath and wheezing.   Cardiovascular: Negative.  Negative for chest pain, palpitations, orthopnea, claudication, leg swelling and PND.  Gastrointestinal: Negative.   Genitourinary: Negative.   Musculoskeletal: Negative for back pain, falls, joint pain, myalgias and neck pain.  Skin: Negative.   Neurological: Negative.  Negative for weakness.  Psychiatric/Behavioral: The patient is nervous/anxious.    Physical Exam: BP (!) 140/92   Pulse 92   Temp 97.7 F (36.5 C)   Resp 14   Ht 5\' 5"  (1.651 m)   Wt 262 lb (118.8 kg)   SpO2 98%   BMI 43.60 kg/m  Wt Readings from Last 3 Encounters:  11/30/17 262 lb (118.8 kg)  02/18/17 252 lb 12.8 oz (114.7 kg)  10/19/16 237 lb (107.5 kg)   General Appearance: Well nourished, in no apparent distress. Eyes: PERRLA, EOMs, conjunctiva no swelling or erythema Sinuses: No Frontal/maxillary tenderness ENT/Mouth: Ext aud canals clear, TMs without erythema, bulging. No erythema, swelling, or exudate on post pharynx.  Tonsils not swollen or erythematous. Hearing normal.  Neck: Supple, thyroid normal.  Respiratory: Respiratory effort normal, BS equal bilaterally without rales, rhonchi, wheezing or stridor.  Cardio: RRR with no MRGs. Brisk peripheral pulses without edema.  Abdomen: Soft, + BS,  Non tender, no guarding, rebound, hernias, masses. Lymphatics: Non tender without lymphadenopathy.  Musculoskeletal: Full ROM, 5/5 strength, Normal gait Skin: Warm, dry without rashes, lesions, ecchymosis.  Neuro: Cranial nerves intact. Normal muscle tone, no cerebellar symptoms. Psych: Awake and oriented X 3, normal affect, Insight and Judgment appropriate.    Vicie Mutters, PA-C 3:46 PM [facility]

## 2017-11-30 ENCOUNTER — Encounter: Payer: Self-pay | Admitting: Physician Assistant

## 2017-11-30 ENCOUNTER — Other Ambulatory Visit: Payer: Self-pay

## 2017-11-30 ENCOUNTER — Ambulatory Visit (INDEPENDENT_AMBULATORY_CARE_PROVIDER_SITE_OTHER): Payer: BLUE CROSS/BLUE SHIELD | Admitting: Physician Assistant

## 2017-11-30 VITALS — BP 140/92 | HR 92 | Temp 97.7°F | Resp 14 | Ht 65.0 in | Wt 262.0 lb

## 2017-11-30 DIAGNOSIS — F3341 Major depressive disorder, recurrent, in partial remission: Secondary | ICD-10-CM

## 2017-11-30 DIAGNOSIS — Z1329 Encounter for screening for other suspected endocrine disorder: Secondary | ICD-10-CM | POA: Diagnosis not present

## 2017-11-30 DIAGNOSIS — Z79899 Other long term (current) drug therapy: Secondary | ICD-10-CM | POA: Diagnosis not present

## 2017-11-30 DIAGNOSIS — Z6839 Body mass index (BMI) 39.0-39.9, adult: Secondary | ICD-10-CM

## 2017-11-30 DIAGNOSIS — Z136 Encounter for screening for cardiovascular disorders: Secondary | ICD-10-CM | POA: Diagnosis not present

## 2017-11-30 DIAGNOSIS — B009 Herpesviral infection, unspecified: Secondary | ICD-10-CM

## 2017-11-30 DIAGNOSIS — G4733 Obstructive sleep apnea (adult) (pediatric): Secondary | ICD-10-CM

## 2017-11-30 DIAGNOSIS — R200 Anesthesia of skin: Secondary | ICD-10-CM

## 2017-11-30 DIAGNOSIS — F411 Generalized anxiety disorder: Secondary | ICD-10-CM

## 2017-11-30 DIAGNOSIS — Z13220 Encounter for screening for lipoid disorders: Secondary | ICD-10-CM

## 2017-11-30 DIAGNOSIS — I10 Essential (primary) hypertension: Secondary | ICD-10-CM | POA: Diagnosis not present

## 2017-11-30 DIAGNOSIS — Z23 Encounter for immunization: Secondary | ICD-10-CM

## 2017-11-30 DIAGNOSIS — D649 Anemia, unspecified: Secondary | ICD-10-CM

## 2017-11-30 DIAGNOSIS — E6609 Other obesity due to excess calories: Secondary | ICD-10-CM

## 2017-11-30 DIAGNOSIS — Z131 Encounter for screening for diabetes mellitus: Secondary | ICD-10-CM

## 2017-11-30 DIAGNOSIS — Z0000 Encounter for general adult medical examination without abnormal findings: Secondary | ICD-10-CM | POA: Diagnosis not present

## 2017-11-30 DIAGNOSIS — Z1389 Encounter for screening for other disorder: Secondary | ICD-10-CM

## 2017-11-30 DIAGNOSIS — Z130 Encounter for screening for diseases of the blood and blood-forming organs and certain disorders involving the immune mechanism: Secondary | ICD-10-CM | POA: Diagnosis not present

## 2017-11-30 DIAGNOSIS — E8881 Metabolic syndrome: Secondary | ICD-10-CM

## 2017-11-30 DIAGNOSIS — E559 Vitamin D deficiency, unspecified: Secondary | ICD-10-CM | POA: Diagnosis not present

## 2017-11-30 DIAGNOSIS — R202 Paresthesia of skin: Secondary | ICD-10-CM

## 2017-11-30 DIAGNOSIS — Z0001 Encounter for general adult medical examination with abnormal findings: Secondary | ICD-10-CM

## 2017-11-30 DIAGNOSIS — E7800 Pure hypercholesterolemia, unspecified: Secondary | ICD-10-CM

## 2017-11-30 DIAGNOSIS — M1711 Unilateral primary osteoarthritis, right knee: Secondary | ICD-10-CM

## 2017-11-30 MED ORDER — FUROSEMIDE 40 MG PO TABS: 40.0000 mg | ORAL_TABLET | Freq: Every day | ORAL | 11 refills | Status: DC

## 2017-11-30 MED ORDER — VITAMIN D (ERGOCALCIFEROL) 1.25 MG (50000 UNIT) PO CAPS: ORAL_CAPSULE | ORAL | 99 refills | Status: DC

## 2017-11-30 MED ORDER — AMITRIPTYLINE HCL 10 MG PO TABS: 10.0000 mg | ORAL_TABLET | Freq: Every evening | ORAL | 0 refills | Status: DC | PRN

## 2017-11-30 MED ORDER — PROMETHAZINE HCL 25 MG PO TABS: ORAL_TABLET | ORAL | 0 refills | Status: DC

## 2017-11-30 MED ORDER — VALACYCLOVIR HCL 500 MG PO TABS: 500.0000 mg | ORAL_TABLET | Freq: Two times a day (BID) | ORAL | 1 refills | Status: DC

## 2017-11-30 MED ORDER — ROSUVASTATIN CALCIUM 10 MG PO TABS: 10.0000 mg | ORAL_TABLET | Freq: Every day | ORAL | 1 refills | Status: DC

## 2017-11-30 MED ORDER — BUPROPION HCL ER (XL) 150 MG PO TB24: 150.0000 mg | ORAL_TABLET | Freq: Every morning | ORAL | 1 refills | Status: DC

## 2017-11-30 MED ORDER — ESCITALOPRAM OXALATE 20 MG PO TABS: 20.0000 mg | ORAL_TABLET | Freq: Every day | ORAL | 2 refills | Status: DC

## 2017-11-30 NOTE — Patient Instructions (Addendum)
TRY THE LASIX ONCE A DAY FOR YOUR BP CAN TAKE MORNING OR LUNCH OR AROUND 5 PM  CRESTOR 10MG  TAKE 4-5 DAYS A WEEK   Tumeric with black pepper extract is a great natural antiinflammatory that helps with arthritis and aches and pain. Can get from [employer] or any health food store. Need to take at least 800mg  twice a day with food.    Your LDL could improve, ideally we want it under a 100.  Your LDL is the bad cholesterol that can lead to heart attack and stroke. To lower your number you can decrease your fatty foods, red meat, cheese, milk and increase fiber like whole grains and veggies. You can also add a fiber supplement like Citracel or Benefiber, these do not cause gas and bloating and are safe to use. Especially if you have a strong family history of heart disease or stroke or you have evidence of plaque on any imaging like a chest xray, we may discuss at your next office visit putting you on a medication to get your number below 100.   VENOUS INSUFFICIENCY Our lower leg venous system is not the most reliable, the heart does NOT pump fluid up, there is a valve system.  The muscles of the leg squeeze and the blood moves up and a valve opens and close, then they squeeze, blood moves up and valves open and closes keeping the blood moving towards the heart.  Lots can go wrong with this valve system.  If someone is sitting or standing without movement, everyone will get swelling.  THINGS TO DO:  Do not stand or sit in one position for long periods of time. Do not sit with your legs crossed. Rest with your legs raised during the day.  Your legs have to be higher than your heart so that gravity will force the valves to open, so please really elevate your legs.   Wear elastic stockings or support hose. Do not wear other tight, encircling garments around the legs, pelvis, or waist.  [employer]  has a wide variety of well priced compression stockings. [place], [place] [address]  [address] has a good cheap selection, I like the socks, they are not as hard to get on  Walk as much as possible to increase blood flow.  Raise the foot of your bed at night with 2-inch blocks.  SEEK MEDICAL CARE IF:   The skin around your ankle starts to break down.  You have pain, redness, tenderness, or hard swelling developing in your leg over a vein.  You are uncomfortable due to leg pain.  If you ever have shortness of breath with exertion or chest pain go to the ER.     When it comes to diets, agreement about the perfect plan isn't easy to find, even among the experts. Experts at the [employer] developed an idea known as the Healthy Eating Plate. Just imagine a plate divided into logical, healthy portions.  The emphasis is on diet quality:  Load up on vegetables and fruits - one-half of your plate: Aim for color and variety, and remember that potatoes don't count.  Go for whole grains - one-quarter of your plate: Whole wheat, barley, wheat berries, quinoa, oats, brown rice, and foods made with them. If you want pasta, go with whole wheat pasta.  Protein power - one-quarter of your plate: Fish, chicken, beans, and nuts are all healthy, versatile protein  sources. Limit red meat.  The diet, however, does go beyond the plate, offering a few other suggestions.  Use healthy plant oils, such as olive, canola, soy, corn, sunflower and peanut. Check the labels, and avoid partially hydrogenated oil, which have unhealthy trans fats.  If you're thirsty, drink water. Coffee and tea are good in moderation, but skip sugary drinks and limit milk and dairy products to one or two daily servings.  The type of carbohydrate in the diet is more important than the amount. Some sources of carbohydrates, such as vegetables, fruits, whole grains, and beans-are healthier than others.  Finally, stay active.

## 2017-12-01 ENCOUNTER — Encounter: Payer: Self-pay | Admitting: Physician Assistant

## 2017-12-01 DIAGNOSIS — Z23 Encounter for immunization: Secondary | ICD-10-CM

## 2017-12-01 LAB — COMPLETE METABOLIC PANEL WITH GFR
AG Ratio: 2 (calc) (ref 1.0–2.5)
ALT: 16 U/L (ref 6–29)
AST: 15 U/L (ref 10–35)
Albumin: 4.7 g/dL (ref 3.6–5.1)
Alkaline phosphatase (APISO): 80 U/L (ref 33–130)
BUN/Creatinine Ratio: 19 (calc) (ref 6–22)
BUN: 21 mg/dL (ref 7–25)
CO2: 26 mmol/L (ref 20–32)
Calcium: 9.5 mg/dL (ref 8.6–10.4)
Chloride: 104 mmol/L (ref 98–110)
Creat: 1.1 mg/dL — ABNORMAL HIGH (ref 0.50–1.05)
GFR, Est African American: 67 mL/min/{1.73_m2} (ref >=60)
GFR, Est Non African American: 58 mL/min/{1.73_m2} — ABNORMAL LOW (ref >=60)
Globulin: 2.3 g/dL (calc) (ref 1.9–3.7)
Glucose, Bld: 88 mg/dL (ref 65–99)
Potassium: 4 mmol/L (ref 3.5–5.3)
Sodium: 138 mmol/L (ref 135–146)
Total Bilirubin: 0.5 mg/dL (ref 0.2–1.2)
Total Protein: 7 g/dL (ref 6.1–8.1)

## 2017-12-01 LAB — LIPID PANEL
Cholesterol: 217 mg/dL — ABNORMAL HIGH (ref <200)
HDL: 49 mg/dL — ABNORMAL LOW (ref >50)
LDL Cholesterol (Calc): 132 mg/dL (calc) — ABNORMAL HIGH
Non-HDL Cholesterol (Calc): 168 mg/dL (calc) — ABNORMAL HIGH (ref <130)
Total CHOL/HDL Ratio: 4.4 (calc) (ref <5.0)
Triglycerides: 222 mg/dL — ABNORMAL HIGH (ref <150)

## 2017-12-01 LAB — VITAMIN D 25 HYDROXY (VIT D DEFICIENCY, FRACTURES): Vit D, 25-Hydroxy: 48 ng/mL (ref 30–100)

## 2017-12-01 LAB — HEMOGLOBIN A1C
Hgb A1c MFr Bld: 5.5 % of total Hgb (ref <5.7)
Mean Plasma Glucose: 111 (calc)
eAG (mmol/L): 6.2 (calc)

## 2017-12-01 LAB — CBC WITH DIFFERENTIAL/PLATELET
Basophils Absolute: 57 cells/uL (ref 0–200)
Basophils Relative: 0.7 %
Eosinophils Absolute: 189 cells/uL (ref 15–500)
Eosinophils Relative: 2.3 %
HCT: 38.3 % (ref 35.0–45.0)
Hemoglobin: 12.8 g/dL (ref 11.7–15.5)
Lymphs Abs: 1501 cells/uL (ref 850–3900)
MCH: 28.4 pg (ref 27.0–33.0)
MCHC: 33.4 g/dL (ref 32.0–36.0)
MCV: 85.1 fL (ref 80.0–100.0)
MPV: 11.3 fL (ref 7.5–12.5)
Monocytes Relative: 5.7 %
Neutro Abs: 5986 cells/uL (ref 1500–7800)
Neutrophils Relative %: 73 %
Platelets: 247 10*3/uL (ref 140–400)
RBC: 4.5 10*6/uL (ref 3.80–5.10)
RDW: 13 % (ref 11.0–15.0)
Total Lymphocyte: 18.3 %
WBC mixed population: 467 cells/uL (ref 200–950)
WBC: 8.2 10*3/uL (ref 3.8–10.8)

## 2017-12-01 LAB — URINALYSIS, ROUTINE W REFLEX MICROSCOPIC
Bilirubin Urine: NEGATIVE
Glucose, UA: NEGATIVE
Hgb urine dipstick: NEGATIVE
Ketones, ur: NEGATIVE
Leukocytes, UA: NEGATIVE
Nitrite: NEGATIVE
Protein, ur: NEGATIVE
Specific Gravity, Urine: 1.025 (ref 1.001–1.03)
pH: 5.5 (ref 5.0–8.0)

## 2017-12-01 LAB — MICROALBUMIN / CREATININE URINE RATIO
Creatinine, Urine: 178 mg/dL (ref 20–275)
Microalb Creat Ratio: 3 mcg/mg creat (ref <30)
Microalb, Ur: 0.6 mg/dL

## 2017-12-01 LAB — IRON, TOTAL/TOTAL IRON BINDING CAP
%SAT: 22 % (calc) (ref 16–45)
Iron: 80 ug/dL (ref 45–160)
TIBC: 362 mcg/dL (calc) (ref 250–450)

## 2017-12-01 LAB — MAGNESIUM: Magnesium: 2 mg/dL (ref 1.5–2.5)

## 2017-12-01 LAB — TSH: TSH: 1.84 mIU/L

## 2017-12-01 LAB — FOLATE RBC: RBC Folate: 768 ng/mL RBC (ref >280)

## 2017-12-13 NOTE — Progress Notes (Signed)
[name] [address], [place] [address] Phone: [phone] Subjective:   Wendy Levine, am serving as a scribe for Dr. Hulan Saas.   CC: Left arm pain  HEN:[id number]  Wendy Levine is a 52 y.o. female coming in with complaint of left arm pain. Has been having tingling in the thumb through 3rd finger. Pain began in July. She has been traveling a lot due to caring for sick parents. Levine shoulder pain. Has been wearing a cock up wrist splint. Has been sleeping with brace on but does not note any difference. Did move a chair recently and she rested it on her head. She had some pain in her cervical spine and the next day her tingling diminished. She also mentions playing a board game at a table for prolonged periods where she rests her left arm on the table.        Past Medical History:  Diagnosis Date  . Allergy   . Anemia   . Anxiety   . ASCUS (atypical squamous cells of undetermined significance) on Pap smear    neg HR HPV 09/2008, ascus 06/2009,10/2009  . HSV-2 infection   . Hypercholesteremia   . Hypertension   . OSA on CPAP   . Pes planus of both feet 06/20/2013  . Prediabetes   . Sleep apnea    Past Surgical History:  Procedure Laterality Date  . CESAREAN SECTION    . COLONOSCOPY  2005?   in [place] -"normal"  . COLPOSCOPY    . KNEE SURGERY     Arthroscopic  . MOUTH SURGERY    . Nexplanon insertion  05/22/2015  . remove moles     Social History   Socioeconomic History  . Marital status: Married    Spouse name: Not on file  . Number of children: Not on file  . Years of education: Not on file  . Highest education level: Not on file  Occupational History  . 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 Financial resource strain: Not on file  . Food insecurity:    Worry: Not on file    Inability: Not on file  . Transportation needs:    Medical: Not on file    Non-medical: Not on file  Tobacco Use  .  Smoking status: Former Smoker    Packs/day: 0.30    Years: 10.00    Pack years: 3.00    Types: Cigarettes    Last attempt to quit: 06/08/1999    Years since quitting: 18.5  . Smokeless tobacco: Never Used  Substance and Sexual Activity  . Alcohol use: Yes    Alcohol/week: 11.0 - 15.0 standard drinks    Types: 7 Standard drinks or equivalent, 4 - 8 Glasses of wine per week  . Drug use: Levine  . Sexual activity: Yes    Partners: Male    Birth control/protection: Condom    Comment: Nexplanon 05/22/2015 1st intercourse 88 yo-5 partners  Lifestyle  . Physical activity:    Days per week: Not on file    Minutes per session: Not on file  . Stress: Not on file  Relationships  . Social connections:    Talks on phone: Not on file    Gets together: Not on file    Attends religious service: Not on file    Active member of club or organization: Not on file    Attends meetings of clubs or organizations: Not on file  Relationship status: Not on file  Other Topics Concern  . Not on file  Social History Narrative   Originally from [place]. Has lived in [place] as well. Previously has traveled to most of the 90 states as well as [place]. Has prior travel to [place]. Previously has worked in Personal assistant and also with veterinarians. Has dogs and cats at home. Levine bird exposure. Levine mold exposure. Does have a hot tub/pool combination.    Allergies  Allergen Reactions  . Doxycycline Hives and Shortness Of Breath  . Ace Inhibitors     cough  . Adhesive [Tape]   . Augmentin [Amoxicillin-Pot Clavulanate] Nausea Only    diarrhea  . Clindamycin/Lincomycin   . Epinephrine-Lidocaine-Na Metabisulfite [Lidocaine-Epinephrine]     Has heart palpitations from the epi, wants without  . Vicodin [Hydrocodone-Acetaminophen] Itching   Family History  Problem Relation Age of Onset  . Hypertension Maternal Grandmother   . Breast cancer Maternal Grandmother 80  . Emphysema Maternal Grandmother   .  Hypertension Mother   . Hyperlipidemia Mother   . Cancer Mother        Skin  . Colon cancer Neg Hx     Current Outpatient Medications (Endocrine & Metabolic):  .  etonogestrel (NEXPLANON) 68 MG IMPL implant, 1 each by Subdermal route once.   Current Outpatient Medications (Cardiovascular):  .  furosemide (LASIX) 40 MG tablet, Take 1 tablet (40 mg total) by mouth daily. .  rosuvastatin (CRESTOR) 10 MG tablet, Take 1 tablet (10 mg total) by mouth daily.   Current Outpatient Medications (Respiratory):  .  promethazine (PHENERGAN) 25 MG tablet, TAKE 1 TABLET (25 MG TOTAL) BY MOUTH EVERY 6 (SIX) HOURS AS NEEDED FOR NAUSEA OR VOMITING. MAX: 4 TABLETS PER DAY   Current Outpatient Medications (Analgesics):  .  meloxicam (MOBIC) 15 MG tablet, Take 1 tablet (15 mg total) by mouth daily.   Current Outpatient Medications (Hematological):  Marland Kitchen  IRON PO, Take by mouth daily. Reported on 05/28/2015   Current Outpatient Medications (Other):  Marland Kitchen  ALPRAZolam (XANAX) 0.5 MG tablet, TAKE 1 TABLET BY MOUTH TWICE DAILY AS NEEDED WHEN FLYING .  amitriptyline (ELAVIL) 10 MG tablet, Take 1-2 tablets (10-20 mg total) by mouth at bedtime as needed. for sleep .  buPROPion (WELLBUTRIN XL) 150 MG 24 hr tablet, Take 1 tablet (150 mg total) by mouth every morning. .  escitalopram (LEXAPRO) 20 MG tablet, Take 1 tablet (20 mg total) by mouth daily. .  fluconazole (DIFLUCAN) 150 MG tablet, Take 1 tablet (150 mg total) by mouth daily. Marland Kitchen  MAGNESIUM PO, Take by mouth daily. Reported on 06/27/2015 .  valACYclovir (VALTREX) 500 MG tablet, Take 1 tablet (500 mg total) by mouth 2 (two) times daily. .  Vitamin D, Ergocalciferol, (DRISDOL) 50000 units CAPS capsule, TAKE ONE CAPSULE BY MOUTH THREE TIMES A WEEK AS DIRECTED .  gabapentin (NEURONTIN) 100 MG capsule, Take 2 capsules (200 mg total) by mouth at bedtime.  Current Facility-Administered Medications (Other):  .  0.9 %  sodium chloride infusion    Past medical  history, social, surgical and family history all reviewed in electronic medical record.  Levine pertanent information unless stated regarding to the chief complaint.   Review of Systems:  Levine headache, visual changes, nausea, vomiting, diarrhea, constipation, dizziness, abdominal pain, skin rash, fevers, chills, night sweats, weight loss, swollen lymph nodes, body aches, joint swelling, chest pain, shortness of breath, mood changes.  Positive muscle aches  Objective  Blood  pressure 138/90, pulse 73, height 5\' 5"  (1.651 m), weight 262 lb (118.8 kg), SpO2 98 %.    General: Levine apparent distress alert and oriented x3 mood and affect normal, dressed appropriately.  HEENT: Pupils equal, extraocular movements intact  Respiratory: Patient's speak in full sentences and does not appear short of breath  Cardiovascular: Levine lower extremity edema, non tender, Levine erythema  Skin: Warm dry intact with Levine signs of infection or rash on extremities or on axial skeleton.  Abdomen: Soft nontender  Neuro: Cranial nerves II through XII are intact, neurovascularly intact in all extremities with 2+ DTRs and 2+ pulses.  Lymph: Levine lymphadenopathy of posterior or anterior cervical chain or axillae bilaterally.  Gait normal with good balance and coordination.  MSK:  Non tender with full range of motion and good stability and symmetric strength and tone of shoulders, elbows, wrist, hip, knee and ankles bilaterally.  Neck: Inspection loss of lordosis. Levine palpable stepoffs. Positive Spurling's maneuver on the left side with radicular symptoms in the C6 distribution. Full neck range of motion Grip strength and sensation normal in bilateral hands Strength good C4 to T1 distribution Levine sensory change to C4 to T1 Negative Hoffman sign bilaterally Reflexes normal  [id number]; 15 additional minutes spent for Therapeutic exercises as stated in above notes.  This included exercises focusing on stretching, strengthening, with significant  focus on eccentric aspects.   Long term goals include an improvement in range of motion, strength, endurance as well as avoiding reinjury. Patient's frequency would include in 1-2 times a day, 3-5 times a week for a duration of 6-12 weeks. Exercises that included:  Basic scapular stabilization to include adduction and depression of scapula Scaption, focusing on proper movement and good control Internal and External rotation utilizing a theraband, with elbow tucked at side entire time Rows with theraband given   Proper technique shown and discussed handout in great detail with ATC.  All questions were discussed and answered.     Impression and Recommendations:     This case required medical decision making of moderate complexity. The above documentation has been reviewed and is accurate and complete Lyndal Pulley, DO       Note: This dictation was prepared with Dragon dictation along with smaller phrase technology. Any transcriptional errors that result from this process are unintentional.

## 2017-12-14 ENCOUNTER — Ambulatory Visit (INDEPENDENT_AMBULATORY_CARE_PROVIDER_SITE_OTHER)
Admission: RE | Admit: 2017-12-14 | Discharge: 2017-12-14 | Disposition: A | Discharge status: Home / Self Care | Payer: BLUE CROSS/BLUE SHIELD | Source: Ambulatory Visit | Attending: Family Medicine | Admitting: Family Medicine

## 2017-12-14 ENCOUNTER — Encounter: Payer: Self-pay | Admitting: Family Medicine

## 2017-12-14 ENCOUNTER — Ambulatory Visit (INDEPENDENT_AMBULATORY_CARE_PROVIDER_SITE_OTHER): Payer: BLUE CROSS/BLUE SHIELD | Admitting: Family Medicine

## 2017-12-14 VITALS — BP 138/90 | HR 73 | Ht 65.0 in | Wt 262.0 lb

## 2017-12-14 DIAGNOSIS — M5412 Radiculopathy, cervical region: Secondary | ICD-10-CM

## 2017-12-14 DIAGNOSIS — M542 Cervicalgia: Secondary | ICD-10-CM | POA: Diagnosis not present

## 2017-12-14 MED ORDER — MELOXICAM 15 MG PO TABS: 15.0000 mg | ORAL_TABLET | Freq: Every day | ORAL | 0 refills | Status: DC

## 2017-12-14 MED ORDER — GABAPENTIN 100 MG PO CAPS: 200.0000 mg | ORAL_CAPSULE | Freq: Every day | ORAL | 3 refills | Status: DC

## 2017-12-14 NOTE — Assessment & Plan Note (Signed)
Cervical radiculopathy.  Seems to be in the C6 distribution.  No weakness noted.  No decrease in deep tendon reflexes.  Been going on 3 months and could be improving a little bit.  Home exercise given, discussed icing regimen.  Discussed which activities to do which wants to avoid.  Discussed posture and ergonomics.  Discussed which activities to avoid.  Follow-up again in 4 to 8 weeks

## 2017-12-14 NOTE — Patient Instructions (Addendum)
Good to see you  Ice 20 minutes 2 times daily. Usually after activity and before bed. Exercises 3 times a week.  I think it is coming from the neck  Meloxicam daily for 10 days then s needed for pain or uncomfortable  Gabapentin 200 mg at night Posture will be key  See me again in 4-6 weeks

## 2018-01-10 NOTE — Progress Notes (Signed)
[name] [address] [place], [place] [address] Phone: [phone] Subjective:   [name], am serving as a scribe for Dr. Hulan Saas.     CC: Neck and back pain  YQI:[id number]  Wendy Levine is a 52 y.o. female coming in with complaint of neck pain. Tingling in her hands has decreased. Is taking Gabapentin and doing some of the exercises. Does note that her neck "pops" more than usual.   Patient is complaining of lower back pain over the SI joints. Today her pain is 2/10. She did have 10/10 pain 3 days ago. Has been lifting furniture. No radicular symptoms.  Patient states is more just a tightness and repetitive activity or sitting in the car long amount of time causes more discomfort and pain.      Past Medical History:  Diagnosis Date  . Allergy   . Anemia   . Anxiety   . ASCUS (atypical squamous cells of undetermined significance) on Pap smear    neg HR HPV 09/2008, ascus 06/2009,10/2009  . HSV-2 infection   . Hypercholesteremia   . Hypertension   . OSA on CPAP   . Pes planus of both feet 06/20/2013  . Prediabetes   . Sleep apnea    Past Surgical History:  Procedure Laterality Date  . CESAREAN SECTION    . COLONOSCOPY  2005?   in [place] -"normal"  . COLPOSCOPY    . KNEE SURGERY     Arthroscopic  . MOUTH SURGERY    . Nexplanon insertion  05/22/2015  . remove moles     Social History   Socioeconomic History  . Marital status: Married    Spouse name: Not on file  . Number of children: Not on file  . Years of education: Not on file  . Highest education level: Not on file  Occupational History  . 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 Financial resource strain: Not on file  . Food insecurity:    Worry: Not on file    Inability: Not on file  . Transportation needs:    Medical: Not on file    Non-medical: Not on file  Tobacco Use  . Smoking status: Former Smoker    Packs/day: 0.30    Years:  10.00    Pack years: 3.00    Types: Cigarettes    Last attempt to quit: 06/08/1999    Years since quitting: 18.6  . Smokeless tobacco: Never Used  Substance and Sexual Activity  . Alcohol use: Yes    Alcohol/week: 11.0 - 15.0 standard drinks    Types: 7 Standard drinks or equivalent, 4 - 8 Glasses of wine per week  . Drug use: No  . Sexual activity: Yes    Partners: Male    Birth control/protection: Condom    Comment: Nexplanon 05/22/2015 1st intercourse 74 yo-5 partners  Lifestyle  . Physical activity:    Days per week: Not on file    Minutes per session: Not on file  . Stress: Not on file  Relationships  . Social connections:    Talks on phone: Not on file    Gets together: Not on file    Attends religious service: Not on file    Active member of club or organization: Not on file    Attends meetings of clubs or organizations: Not on file    Relationship status: Not on file  Other Topics Concern  . Not on file  Social History Narrative   Originally from [place]. Has lived in [place] as well. Previously has traveled to most of the [place] states as well as [place]. Has prior travel to [place]. Previously has worked in Personal assistant and also with veterinarians. Has dogs and cats at home. No bird exposure. No mold exposure. Does have a hot tub/pool combination.    Allergies  Allergen Reactions  . Doxycycline Hives and Shortness Of Breath  . Ace Inhibitors     cough  . Adhesive [Tape]   . Augmentin [Amoxicillin-Pot Clavulanate] Nausea Only    diarrhea  . Clindamycin/Lincomycin   . Epinephrine-Lidocaine-Na Metabisulfite [Lidocaine-Epinephrine]     Has heart palpitations from the epi, wants without  . Vicodin [Hydrocodone-Acetaminophen] Itching   Family History  Problem Relation Age of Onset  . Hypertension Maternal Grandmother   . Breast cancer Maternal Grandmother 80  . Emphysema Maternal Grandmother   . Hypertension Mother   . Hyperlipidemia Mother   . Cancer Mother         Skin  . Colon cancer Neg Hx     Current Outpatient Medications (Endocrine & Metabolic):  .  etonogestrel (NEXPLANON) 68 MG IMPL implant, 1 each by Subdermal route once.   Current Outpatient Medications (Cardiovascular):  .  furosemide (LASIX) 40 MG tablet, Take 1 tablet (40 mg total) by mouth daily. .  rosuvastatin (CRESTOR) 10 MG tablet, Take 1 tablet (10 mg total) by mouth daily.   Current Outpatient Medications (Respiratory):  .  promethazine (PHENERGAN) 25 MG tablet, TAKE 1 TABLET (25 MG TOTAL) BY MOUTH EVERY 6 (SIX) HOURS AS NEEDED FOR NAUSEA OR VOMITING. MAX: 4 TABLETS PER DAY   Current Outpatient Medications (Analgesics):  .  meloxicam (MOBIC) 15 MG tablet, Take 1 tablet (15 mg total) by mouth daily.   Current Outpatient Medications (Hematological):  Marland Kitchen  IRON PO, Take by mouth daily. Reported on 05/28/2015   Current Outpatient Medications (Other):  Marland Kitchen  ALPRAZolam (XANAX) 0.5 MG tablet, TAKE 1 TABLET BY MOUTH TWICE DAILY AS NEEDED WHEN FLYING .  amitriptyline (ELAVIL) 10 MG tablet, Take 1-2 tablets (10-20 mg total) by mouth at bedtime as needed. for sleep .  buPROPion (WELLBUTRIN XL) 150 MG 24 hr tablet, Take 1 tablet (150 mg total) by mouth every morning. .  escitalopram (LEXAPRO) 20 MG tablet, Take 1 tablet (20 mg total) by mouth daily. .  fluconazole (DIFLUCAN) 150 MG tablet, Take 1 tablet (150 mg total) by mouth daily. Marland Kitchen  gabapentin (NEURONTIN) 100 MG capsule, Take 2 capsules (200 mg total) by mouth at bedtime. Marland Kitchen  MAGNESIUM PO, Take by mouth daily. Reported on 06/27/2015 .  valACYclovir (VALTREX) 500 MG tablet, Take 1 tablet (500 mg total) by mouth 2 (two) times daily. .  Vitamin D, Ergocalciferol, (DRISDOL) 50000 units CAPS capsule, TAKE ONE CAPSULE BY MOUTH THREE TIMES A WEEK AS DIRECTED .  Diclofenac Sodium 2 % SOLN, Place 2 g onto the skin 2 (two) times daily.  Current Facility-Administered Medications (Other):  .  0.9 %  sodium chloride infusion    Past medical  history, social, surgical and family history all reviewed in electronic medical record.  No pertanent information unless stated regarding to the chief complaint.   Review of Systems:  No headache, visual changes, nausea, vomiting, diarrhea, constipation, dizziness, abdominal pain, skin rash, fevers, chills, night sweats, weight loss, swollen lymph nodes, body aches, joint swelling,  chest pain, shortness of breath, mood changes.  Positive muscle aches  Objective  Blood pressure 122/82, pulse 81, height 5\' 5"  (1.651 m), weight 262 lb (118.8 kg), SpO2 99 %.    General: No apparent distress alert and oriented x3 mood and affect normal, dressed appropriately.  HEENT: Pupils equal, extraocular movements intact  Respiratory: Patient's speak in full sentences and does not appear short of breath  Cardiovascular: No lower extremity edema, non tender, no erythema  Skin: Warm dry intact with no signs of infection or rash on extremities or on axial skeleton.  Abdomen: Soft nontender  Neuro: Cranial nerves II through XII are intact, neurovascularly intact in all extremities with 2+ DTRs and 2+ pulses.  Lymph: No lymphadenopathy of posterior or anterior cervical chain or axillae bilaterally.  Gait normal with good balance and coordination.  MSK:  Non tender with full range of motion and good stability and symmetric strength and tone of shoulders, elbows, wrist, hip, knee and ankles bilaterally.  Neck: Inspection mild loss of lordosis. No palpable stepoffs. Negative Spurling's maneuver. Full neck range of motion Grip strength and sensation normal in bilateral hands Strength good C4 to T1 distribution No sensory change to C4 to T1 Negative Hoffman sign bilaterally Reflexes normal Tightness of the trapezius bilaterally  Back Exam:  Inspection: Unremarkable  Motion: Flexion 40 deg severe tightness in the hip flexors, Extension 25 deg, Side Bending to 45 deg bilaterally,  Rotation to 45 deg  bilaterally  SLR laying: Negative  XSLR laying: Negative  Palpable tenderness: Tenderness to palpation.Marland Kitchen FABER: Tightness bilaterally. Sensory change: Gross sensation intact to all lumbar and sacral dermatomes.  Reflexes: 2+ at both patellar tendons, 2+ at achilles tendons, Babinski's downgoing.  Strength at foot  Plantar-flexion: 5/5 Dorsi-flexion: 5/5 Eversion: 5/5 Inversion: 5/5  Leg strength  Quad: 5/5 Hamstring: 5/5 Hip flexor: 5/5 Hip abductors: 5/5      Impression and Recommendations:     This case required medical decision making of moderate complexity. The above documentation has been reviewed and is accurate and complete Lyndal Pulley, DO       Note: This dictation was prepared with Dragon dictation along with smaller phrase technology. Any transcriptional errors that result from this process are unintentional.

## 2018-01-11 ENCOUNTER — Ambulatory Visit (INDEPENDENT_AMBULATORY_CARE_PROVIDER_SITE_OTHER)
Admission: RE | Admit: 2018-01-11 | Discharge: 2018-01-11 | Disposition: A | Discharge status: Home / Self Care | Payer: BLUE CROSS/BLUE SHIELD | Source: Ambulatory Visit | Attending: Family Medicine | Admitting: Family Medicine

## 2018-01-11 ENCOUNTER — Encounter: Payer: Self-pay | Admitting: Family Medicine

## 2018-01-11 ENCOUNTER — Ambulatory Visit (INDEPENDENT_AMBULATORY_CARE_PROVIDER_SITE_OTHER): Payer: BLUE CROSS/BLUE SHIELD | Admitting: Family Medicine

## 2018-01-11 VITALS — BP 122/82 | HR 81 | Ht 65.0 in | Wt 262.0 lb

## 2018-01-11 DIAGNOSIS — G8929 Other chronic pain: Secondary | ICD-10-CM

## 2018-01-11 DIAGNOSIS — M533 Sacrococcygeal disorders, not elsewhere classified: Secondary | ICD-10-CM

## 2018-01-11 DIAGNOSIS — M24559 Contracture, unspecified hip: Secondary | ICD-10-CM

## 2018-01-11 MED ORDER — DICLOFENAC SODIUM 2 % TD SOLN: 2.0000 g | Freq: Two times a day (BID) | TRANSDERMAL | 3 refills | Status: DC

## 2018-01-11 NOTE — Patient Instructions (Signed)
Great to see you  Choline 500mg  for your mom.  Gabapentin 0-3 pills at night and you play with it  Xray downstairs  Exercises 3 times a week.  Hip flexors stretching will be key  Try to change the driving position  pennsaid pinkie amount topically 2 times daily as needed.   See me again in 4 weeks

## 2018-01-11 NOTE — Assessment & Plan Note (Signed)
Patient has had more of the hip flexor tightness.  We discussed with patient at great length about stretching, core strengthening, hip abductor strengthening.  Patient work with Product/process development scientist.  We will get x-rays to rule out any other arthritic changes that could be potentially contributing.  Follow-up again in 4 to 8 weeks

## 2018-01-12 ENCOUNTER — Encounter: Payer: Self-pay | Admitting: Family Medicine

## 2018-01-13 ENCOUNTER — Encounter: Payer: Self-pay | Admitting: Family Medicine

## 2018-01-14 ENCOUNTER — Encounter: Payer: Self-pay | Admitting: Family Medicine

## 2018-01-15 ENCOUNTER — Other Ambulatory Visit: Payer: Self-pay | Admitting: Internal Medicine

## 2018-01-15 ENCOUNTER — Other Ambulatory Visit: Payer: Self-pay | Admitting: Adult Health

## 2018-01-15 DIAGNOSIS — E6609 Other obesity due to excess calories: Secondary | ICD-10-CM

## 2018-01-15 DIAGNOSIS — Z6839 Body mass index (BMI) 39.0-39.9, adult: Principal | ICD-10-CM

## 2018-01-15 DIAGNOSIS — F3341 Major depressive disorder, recurrent, in partial remission: Secondary | ICD-10-CM

## 2018-01-25 ENCOUNTER — Other Ambulatory Visit: Payer: Self-pay | Admitting: Adult Health

## 2018-01-25 DIAGNOSIS — F411 Generalized anxiety disorder: Secondary | ICD-10-CM

## 2018-02-01 ENCOUNTER — Other Ambulatory Visit: Payer: Self-pay | Admitting: Adult Health

## 2018-02-01 DIAGNOSIS — F411 Generalized anxiety disorder: Secondary | ICD-10-CM

## 2018-02-02 LAB — HM MAMMOGRAPHY

## 2018-02-09 ENCOUNTER — Ambulatory Visit: Payer: BLUE CROSS/BLUE SHIELD | Admitting: Family Medicine

## 2018-02-10 IMAGING — CR DG CHEST 2V
2 series · 2 of 2 positions shown · non-contrast
Comparison: 06/08/2011

CLINICAL DATA: Cough, shortness of breath

EXAM:
CHEST  2 VIEW

[chest pa]
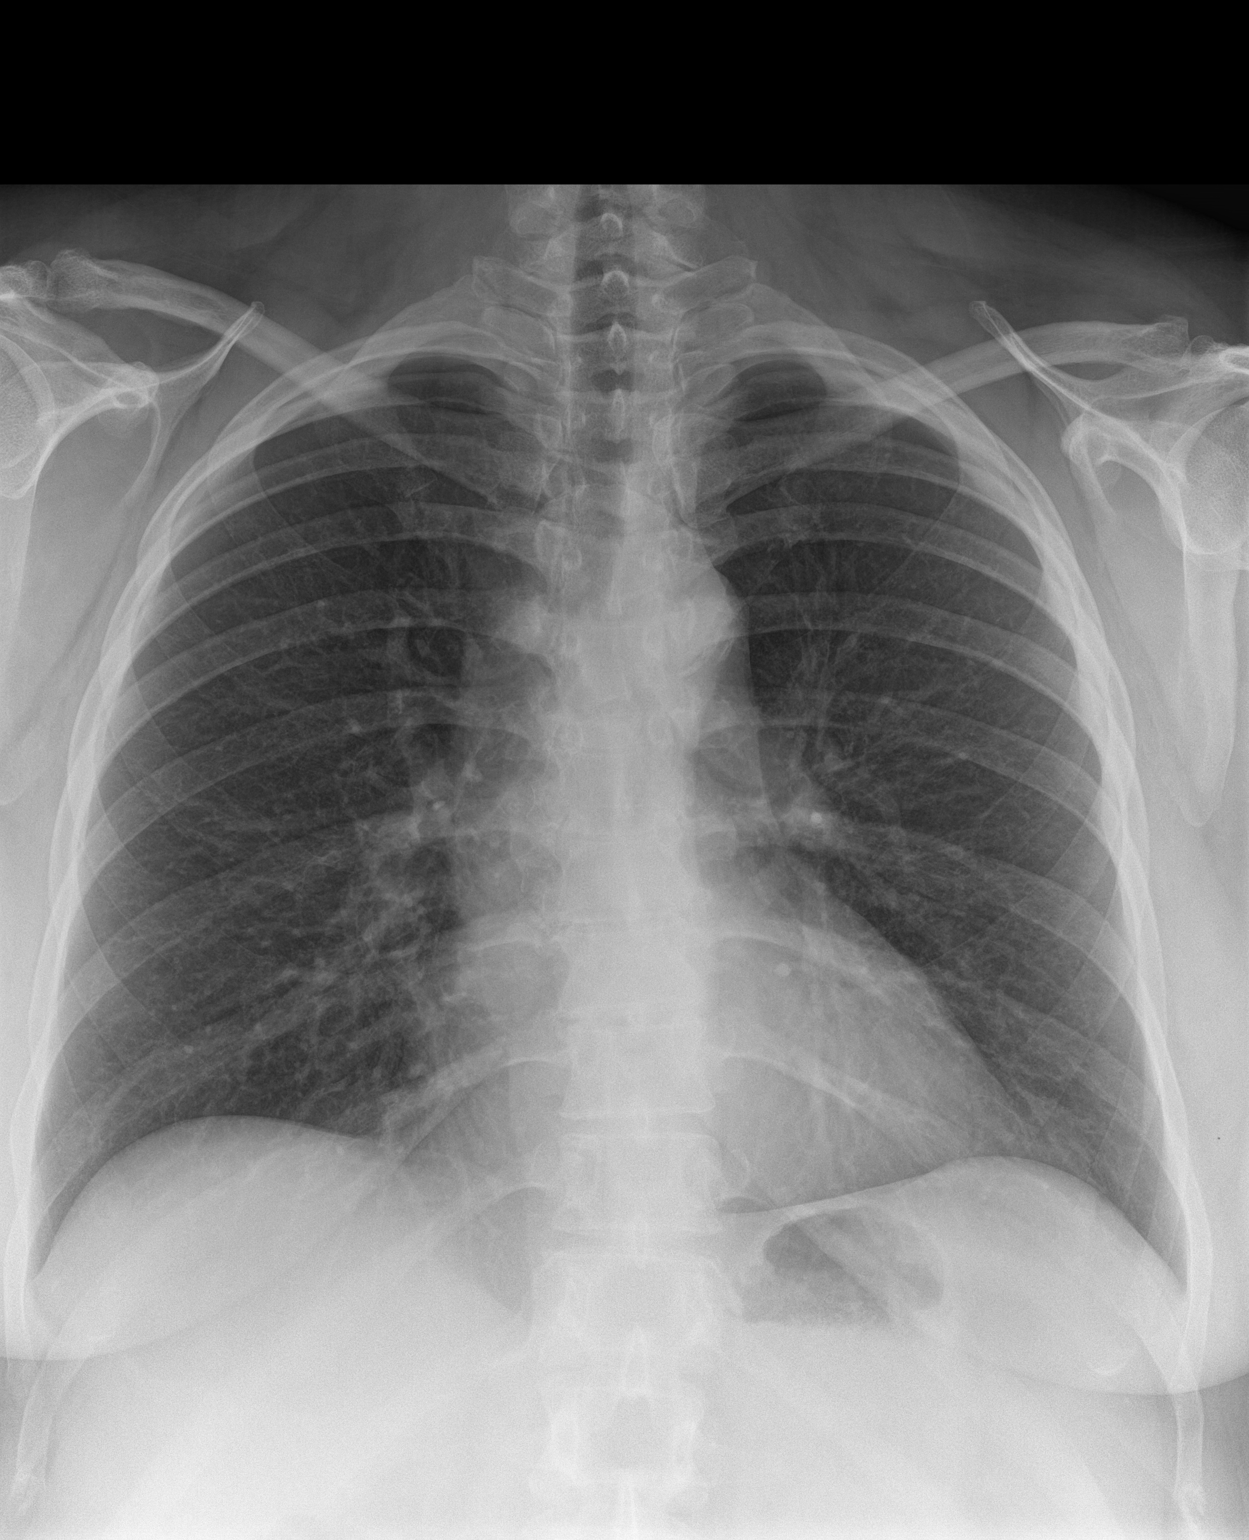

[chest lat]
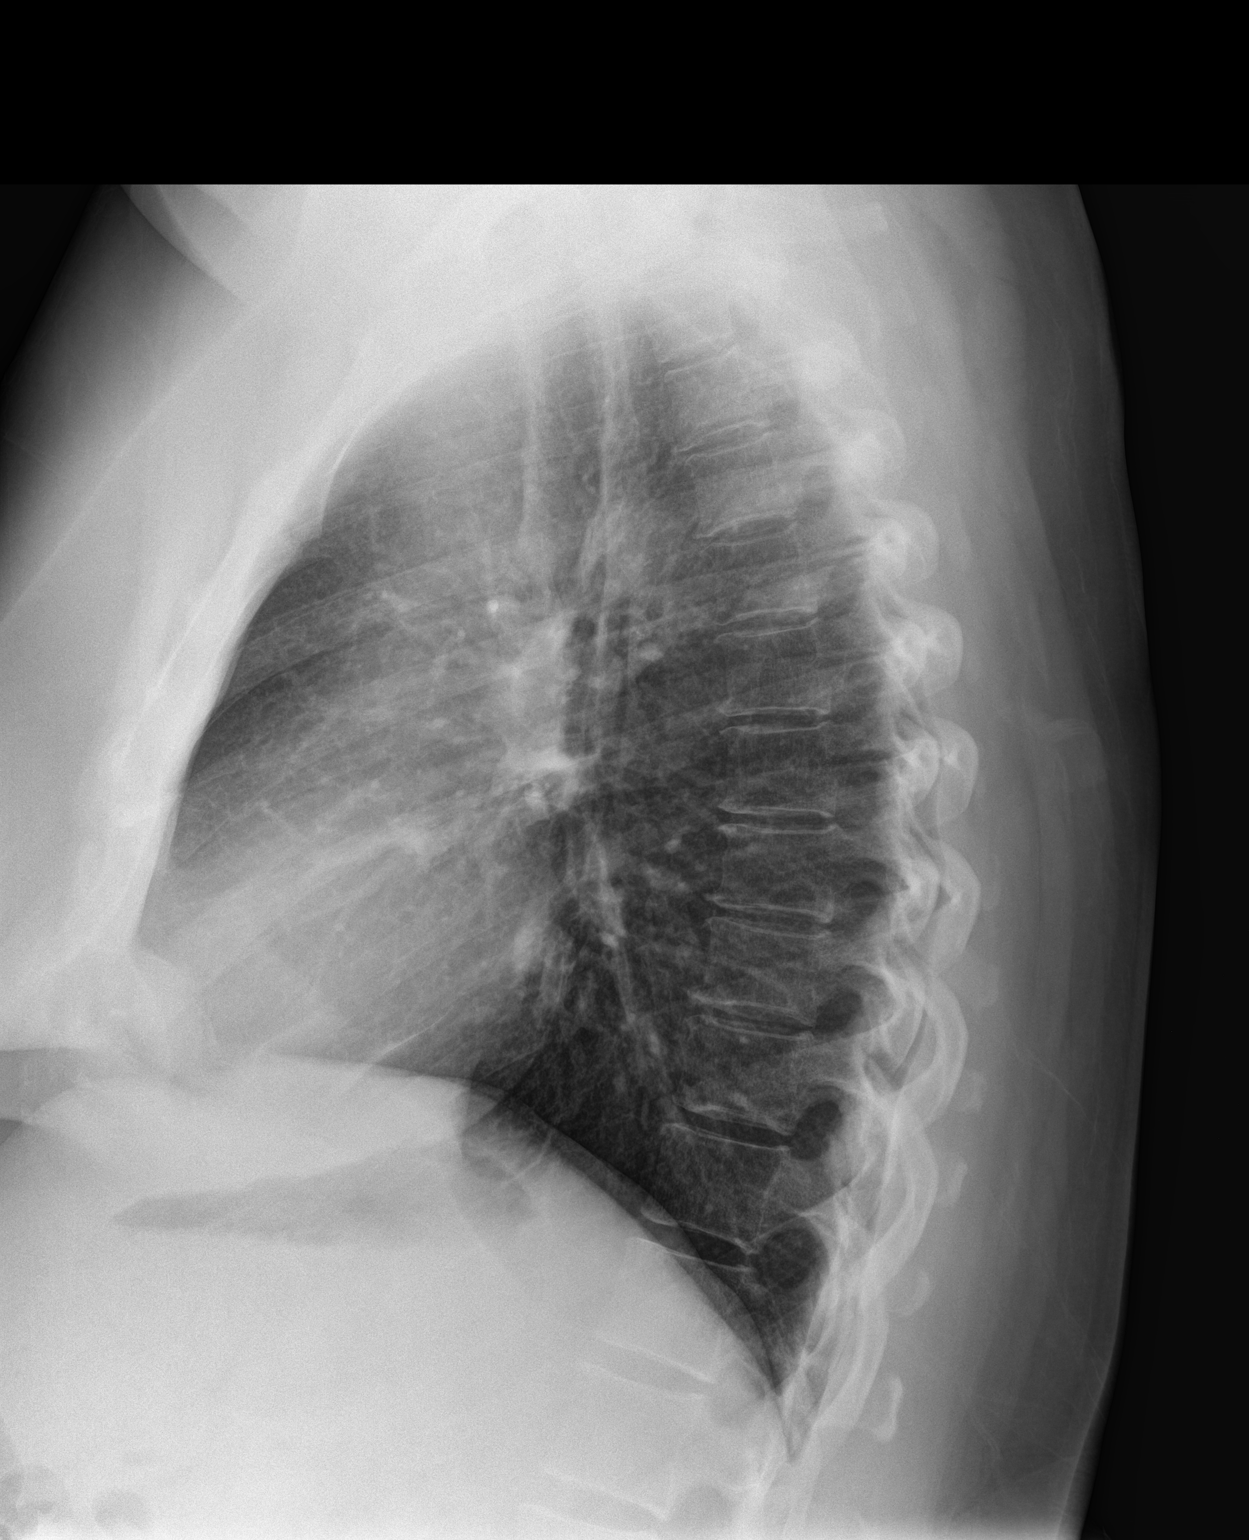

[2 of 2 positions shown; findings below may reference images not displayed]

FINDINGS: Lungs are clear.  No pleural effusion or pneumothorax.

The heart is normal in size.

Mild degenerative changes of the visualized thoracolumbar spine.
IMPRESSION: No evidence of acute cardiopulmonary disease.

## 2018-02-20 ENCOUNTER — Other Ambulatory Visit: Payer: Self-pay | Admitting: Internal Medicine

## 2018-04-18 ENCOUNTER — Encounter: Payer: Self-pay | Admitting: Physician Assistant

## 2018-04-18 ENCOUNTER — Ambulatory Visit (INDEPENDENT_AMBULATORY_CARE_PROVIDER_SITE_OTHER): Payer: BLUE CROSS/BLUE SHIELD | Admitting: Physician Assistant

## 2018-04-18 VITALS — BP 130/82 | HR 75 | Temp 97.3°F | Ht 65.0 in | Wt 253.0 lb

## 2018-04-18 DIAGNOSIS — E559 Vitamin D deficiency, unspecified: Secondary | ICD-10-CM | POA: Diagnosis not present

## 2018-04-18 DIAGNOSIS — Z79899 Other long term (current) drug therapy: Secondary | ICD-10-CM

## 2018-04-18 DIAGNOSIS — I10 Essential (primary) hypertension: Secondary | ICD-10-CM | POA: Diagnosis not present

## 2018-04-18 DIAGNOSIS — E7800 Pure hypercholesterolemia, unspecified: Secondary | ICD-10-CM

## 2018-04-18 DIAGNOSIS — G4733 Obstructive sleep apnea (adult) (pediatric): Secondary | ICD-10-CM

## 2018-04-18 NOTE — Patient Instructions (Addendum)
When it comes to diets, agreement about the perfect plan isn't easy to find, even among the experts. Experts at the [place] [employer] developed an idea known as the Healthy Eating Plate. Just imagine a plate divided into logical, healthy portions.  The emphasis is on diet quality:  Load up on vegetables and fruits - one-half of your plate: Aim for color and variety, and remember that potatoes don't count.  Go for whole grains - one-quarter of your plate: Whole wheat, barley, wheat berries, quinoa, oats, brown rice, and foods made with them. If you want pasta, go with whole wheat pasta.  Protein power - one-quarter of your plate: Fish, chicken, beans, and nuts are all healthy, versatile protein sources. Limit red meat.  The diet, however, does go beyond the plate, offering a few other suggestions.  Use healthy plant oils, such as olive, canola, soy, corn, sunflower and peanut. Check the labels, and avoid partially hydrogenated oil, which have unhealthy trans fats.  If you're thirsty, drink water. Coffee and tea are good in moderation, but skip sugary drinks and limit milk and dairy products to one or two daily servings.  The type of carbohydrate in the diet is more important than the amount. Some sources of carbohydrates, such as vegetables, fruits, whole grains, and beans-are healthier than others.  Finally, stay active.  Check out  Mini habits for weight loss book  2 apps for tracking food is myfitness pal  loseit OR can take picture of your food   Drink 1/2 your body weight in fluid ounces of water daily; drink a tall glass of water 30 min before meals  Don't eat until you're stuffed- listen to your stomach and eat until you are 80% full   Try eating off of a salad plate; wait 10 min after finishing before going back for seconds  Start by eating the vegetables on your plate; aim for 50% of your meals to be fruits or vegetables  Then eat your protein - lean  meats (grass fed if possible), fish, beans, nuts in moderation  Eat your carbs/starch last ONLY if you still are hungry. If you can, stop before finishing it all  Avoid sugar and flour - the closer it looks to it's original form in nature, typically the better it is for you  Splurge in moderation - "assign" days when you get to splurge and have the "bad stuff" - I like to follow a 80% - 20% plan- "good" choices 80 % of the time, "bad" choices in moderation 20% of the time  Simple equation is: Calories out > calories in = weight loss - even if you eat the bad stuff, if you limit portions, you will still lose weight   Silent reflux: Not all heartburn burns...Marland KitchenMarland KitchenMarland Kitchen  What is LPR? Laryngopharyngeal reflux (LPR) or silent reflux is a condition in which acid that is made in the stomach travels up the esophagus (swallowing tube) and gets to the throat. Not everyone with reflux has a lot of heartburn or indigestion. In fact, many people with LPR never have heartburn. This is why LPR is called SILENT REFLUX, and the terms "Silent reflux" and "LPR" are often used interchangeably. Because LPR is silent, it is sometimes difficult to diagnose.  How can you tell if you have LPR?  Marland Kitchen Chronic hoarseness- Some people have hoarseness that comes and goes . throat clearing  . Cough . It can cause shortness of breath and cause asthma like  symptoms. Marland Kitchen a feeling of a lump in the throat  . difficulty swallowing . a problem with too much nose and throat drainage.  . Some people will feel their esophagus spasm which feels like their heart beating hard and fast, this will usually be after a meal, at rest, or lying down at night.    How do I treat this? Treatment for LPR should be individualized, and your doctor will suggest the best treatment for you. Generally there are several treatments for LPR: . changing habits and diet to reduce reflux,  . medications to reduce stomach acid, and  . surgery to prevent  reflux. Most people with LPR need to modify how and when they eat, as well as take some medication, to get well. Sometimes, nonprescription liquid antacids, such as Maalox, Gelucil and Mylanta are recommended. When used, these antacids should be taken four times each day - one tablespoon one hour after each meal and before bedtime. Dietary and lifestyle changes alone are not often enough to control LPR - medications that reduce stomach acid are also usually needed. These must be prescribed by our doctor.  Can try famotidine at night or try prilosec at night x 2 week.   TIPS FOR REDUCING REFLUX AND LPR Control your LIFE-STYLE and your DIET! Marland Kitchen If you use tobacco, QUIT.  Marland Kitchen Smoking makes you reflux. After every cigarette you have some LPR.  . Don't wear clothing that is too tight, especially around the waist (trousers, corsets, belts).  . Do not lie down just after eating...in fact, do not eat within three hours of bedtime.  . You should be on a low-fat diet.  . Limit your intake of red meat.  . Limit your intake of butter.  Marland Kitchen Avoid fried foods.  . Avoid chocolate  . Avoid cheese.  Marland Kitchen Avoid eggs. Marland Kitchen Specifically avoid caffeine (especially coffee and tea), soda pop (especially cola) and mints.  . Avoid alcoholic beverages, particularly in the evening.

## 2018-04-18 NOTE — Progress Notes (Signed)
Assessment and Plan:   Hypertension - monitor blood pressure at home. Continue DASH diet.  Reminder to go to the ER if any CP, SOB, nausea, dizziness, severe HA, changes vision/speech, left arm numbness and tingling and jaw pain.  Cholesterol -Continue diet and exercise. Check cholesterol.    Prediabetes  -Continue diet and exercise. Check A1C  Vitamin D Def - check level and continue medications.   Obesity morbid - follow up 3 months for progress monitoring - increase veggies, decrease carbs - long discussion about weight loss, diet, and exercise  Depression, major, recurrent, in partial remission (HCC) -     buPROPion (WELLBUTRIN XL) 150 MG 24 hr tablet; Take 1 tablet (150 mg total) by mouth every morning. Continue lexapro  Continue diet and meds as discussed. Further disposition pending results of labs. Over 30 minutes of exam, counseling, chart review, and critical decision making was performed  Future Appointments  Date Time Provider [place]  12/12/2018  3:00 PM Vicie Mutters, PA-C [facility] None     HPI 53 y.o. female  presents for 3 month follow up on hypertension, cholesterol, prediabetes, obesity, and vitamin D deficiency.   Patient was drinking coffee when had substernal pressure x 20 mins, no SOB, no accompaniments with it. Has not happened again.    Her blood pressure has been controlled at home,  today their BP is BP: 130/82,  She is on lexapro 40m 1/2 a pill a day and having some depression, she is on xanax 3 x a week. She has been very depressed, she is meditating, she is about to finalize her divorce, [place] dad with met kidney disease with some dementia, now on chemo and mom with dementia.    She does not workout. She denies chest pain, dizziness, SOB.   She is not on cholesterol medication, has been off but did take on this week due to poor eating habits. Her cholesterol is at goal. The cholesterol last visit was:    Lab Results  Component Value Date   CHOL 217 (H) 11/30/2017   HDL 49 (L) 11/30/2017   LDLCALC 132 (H) 11/30/2017   TRIG 222 (H) 11/30/2017   CHOLHDL 4.4 11/30/2017    She has been working on diet and exercise for prediabetes with insulin resistance due to obesity, and denies paresthesia of the feet, polydipsia, polyuria and visual disturbances. Last A1C in the office was:  Lab Results  Component Value Date   HGBA1C 5.5 11/30/2017   Patient is on Vitamin D supplement.   Lab Results  Component Value Date   VD25OH 48 11/30/2017     BMI is Body mass index is 42.1 kg/m., she is working on diet and exercise. She has been drinking less and working out at night.  Wt Readings from Last 3 Encounters:  04/18/18 253 lb (114.8 kg)  01/11/18 262 lb (118.8 kg)  12/14/17 262 lb (118.8 kg)    Current Medications:  Current Outpatient Medications on File Prior to Visit  Medication Sig Dispense Refill  . ALPRAZolam (XANAX) 0.5 MG tablet TAKE 1 TABLET BY MOUTH TWICE DAILY AS NEEDED WHEN FLYING 30 tablet 0  . amitriptyline (ELAVIL) 10 MG tablet TAKE 1-2 TABLETS BY MOUTH EVERY NIGHT AT BEDTIME AS NEEDED FOR SLEEP 180 tablet 1  . buPROPion (WELLBUTRIN XL) 150 MG 24 hr tablet TAKE ONE TABLET BY MOUTH EVERY MORNING 90 tablet 1  . Diclofenac Sodium 2 % SOLN Place 2 g onto the skin 2 (  two) times daily. 112 g 3  . escitalopram (LEXAPRO) 20 MG tablet Take 1 tablet (20 mg total) by mouth daily. 30 tablet 2  . etonogestrel (NEXPLANON) 68 MG IMPL implant 1 each by Subdermal route once.    . fluconazole (DIFLUCAN) 150 MG tablet Take 1 tablet (150 mg total) by mouth daily. 1 tablet 3  . furosemide (LASIX) 40 MG tablet Take 1 tablet (40 mg total) by mouth daily. 30 tablet 11  . gabapentin (NEURONTIN) 100 MG capsule Take 2 capsules (200 mg total) by mouth at bedtime. 60 capsule 3  . meloxicam (MOBIC) 15 MG tablet Take 1 tablet (15 mg total) by mouth daily. 30 tablet 0  . promethazine (PHENERGAN) 25 MG tablet  TAKE 1 TABLET (25 MG TOTAL) BY MOUTH EVERY 6 (SIX) HOURS AS NEEDED FOR NAUSEA OR VOMITING. MAX: 4 TABLETS PER DAY 30 tablet 0  . rosuvastatin (CRESTOR) 10 MG tablet TAKE ONE TABLET BY MOUTH DAILY 90 tablet 0  . valACYclovir (VALTREX) 500 MG tablet Take 1 tablet (500 mg total) by mouth 2 (two) times daily. (Patient taking differently: Take 500 mg by mouth as needed. ) 180 tablet 1  . Vitamin D, Ergocalciferol, (DRISDOL) 50000 units CAPS capsule TAKE ONE CAPSULE BY MOUTH THREE TIMES A WEEK AS DIRECTED (Patient taking differently: TAKE ONE CAPSULE BY MOUTH ONECE A WEEK AS DIRECTED) 12 capsule PRN  . IRON PO Take by mouth daily. Reported on 05/28/2015    . MAGNESIUM PO Take by mouth daily. Reported on 06/27/2015     Current Facility-Administered Medications on File Prior to Visit  Medication Dose Route Frequency Provider Last Rate Last Dose  . 0.9 %  sodium chloride infusion  500 mL Intravenous Continuous Armbruster, Carlota Raspberry, MD       Medical History:  Past Medical History:  Diagnosis Date  . Allergy   . Anemia   . Anxiety   . ASCUS (atypical squamous cells of undetermined significance) on Pap smear    neg HR HPV 09/2008, ascus 06/2009,10/2009  . HSV-2 infection   . Hypercholesteremia   . Hypertension   . OSA on CPAP   . Pes planus of both feet 06/20/2013  . Prediabetes   . Sleep apnea    Allergies:  Allergies  Allergen Reactions  . Doxycycline Hives and Shortness Of Breath  . Ace Inhibitors     cough  . Adhesive [Tape]   . Augmentin [Amoxicillin-Pot Clavulanate] Nausea Only    diarrhea  . Clindamycin/Lincomycin   . Epinephrine-Lidocaine-Na Metabisulfite [Lidocaine-Epinephrine]     Has heart palpitations from the epi, wants without  . Vicodin [Hydrocodone-Acetaminophen] Itching    Review of Systems:  Review of Systems  Constitutional: Positive for malaise/fatigue. Negative for chills, diaphoresis, fever and weight loss.  HENT: Negative.   Eyes: Negative.   Respiratory: Negative  for cough, hemoptysis, sputum production, shortness of breath and wheezing.   Cardiovascular: Negative.  Negative for chest pain, palpitations, orthopnea, claudication, leg swelling and PND.  Gastrointestinal: Negative.   Genitourinary: Negative.   Musculoskeletal: Negative for back pain, falls, joint pain, myalgias and neck pain.  Skin: Negative.   Neurological: Negative.  Negative for weakness.  Psychiatric/Behavioral: Negative.     Family history- Review and unchanged Social history- Review and unchanged Physical Exam: BP 130/82   Pulse 75   Temp (!) 97.3 F (36.3 C)   Ht '5\' 5"'  (1.651 m)   Wt 253 lb (114.8 kg)   SpO2 97%   BMI 42.10  kg/m  Wt Readings from Last 3 Encounters:  04/18/18 253 lb (114.8 kg)  01/11/18 262 lb (118.8 kg)  12/14/17 262 lb (118.8 kg)   General Appearance: Well nourished, in no apparent distress. Eyes: PERRLA, EOMs, conjunctiva no swelling or erythema Sinuses: No Frontal/maxillary tenderness ENT/Mouth: Ext aud canals clear, TMs without erythema, bulging. No erythema, swelling, or exudate on post pharynx.  Tonsils not swollen or erythematous. Hearing normal.  Neck: Supple, thyroid normal.  Respiratory: Respiratory effort normal, BS equal bilaterally without rales, rhonchi, wheezing or stridor.  Cardio: RRR with no MRGs. Brisk peripheral pulses without edema.  Abdomen: Soft, + BS,  Non tender, no guarding, rebound, hernias, masses. Lymphatics: Non tender without lymphadenopathy.  Musculoskeletal: Full ROM, 5/5 strength, Normal gait Skin: Warm, dry without rashes, lesions, ecchymosis.  Neuro: Cranial nerves intact. Normal muscle tone, no cerebellar symptoms. Psych: Awake and oriented X 3, normal affect, Insight and Judgment appropriate.    Vicie Mutters, PA-C 3:52 PM [facility]

## 2018-04-19 LAB — CBC WITH DIFFERENTIAL/PLATELET
Absolute Monocytes: 532 cells/uL (ref 200–950)
Basophils Absolute: 91 cells/uL (ref 0–200)
Basophils Relative: 1.3 %
Eosinophils Absolute: 112 cells/uL (ref 15–500)
Eosinophils Relative: 1.6 %
HCT: 39.1 % (ref 35.0–45.0)
Hemoglobin: 13.3 g/dL (ref 11.7–15.5)
Lymphs Abs: 1820 cells/uL (ref 850–3900)
MCH: 28.9 pg (ref 27.0–33.0)
MCHC: 34 g/dL (ref 32.0–36.0)
MCV: 84.8 fL (ref 80.0–100.0)
MPV: 11.3 fL (ref 7.5–12.5)
Monocytes Relative: 7.6 %
Neutro Abs: 4445 cells/uL (ref 1500–7800)
Neutrophils Relative %: 63.5 %
Platelets: 268 10*3/uL (ref 140–400)
RBC: 4.61 10*6/uL (ref 3.80–5.10)
RDW: 13.4 % (ref 11.0–15.0)
Total Lymphocyte: 26 %
WBC: 7 10*3/uL (ref 3.8–10.8)

## 2018-04-19 LAB — COMPLETE METABOLIC PANEL WITH GFR
AG Ratio: 1.7 (calc) (ref 1.0–2.5)
ALT: 18 U/L (ref 6–29)
AST: 21 U/L (ref 10–35)
Albumin: 4.7 g/dL (ref 3.6–5.1)
Alkaline phosphatase (APISO): 93 U/L (ref 37–153)
BUN/Creatinine Ratio: 16 (calc) (ref 6–22)
BUN: 17 mg/dL (ref 7–25)
CO2: 27 mmol/L (ref 20–32)
Calcium: 9.5 mg/dL (ref 8.6–10.4)
Chloride: 104 mmol/L (ref 98–110)
Creat: 1.08 mg/dL — ABNORMAL HIGH (ref 0.50–1.05)
GFR, Est African American: 68 mL/min/{1.73_m2} (ref >=60)
GFR, Est Non African American: 59 mL/min/{1.73_m2} — ABNORMAL LOW (ref >=60)
Globulin: 2.7 g/dL (calc) (ref 1.9–3.7)
Glucose, Bld: 98 mg/dL (ref 65–99)
Potassium: 4.3 mmol/L (ref 3.5–5.3)
Sodium: 142 mmol/L (ref 135–146)
Total Bilirubin: 0.4 mg/dL (ref 0.2–1.2)
Total Protein: 7.4 g/dL (ref 6.1–8.1)

## 2018-04-19 LAB — LIPID PANEL
Cholesterol: 185 mg/dL (ref <200)
HDL: 41 mg/dL — ABNORMAL LOW (ref >=50)
LDL Cholesterol (Calc): 115 mg/dL (calc) — ABNORMAL HIGH
Non-HDL Cholesterol (Calc): 144 mg/dL (calc) — ABNORMAL HIGH (ref <130)
Total CHOL/HDL Ratio: 4.5 (calc) (ref <5.0)
Triglycerides: 170 mg/dL — ABNORMAL HIGH (ref <150)

## 2018-04-19 LAB — MAGNESIUM: Magnesium: 2.1 mg/dL (ref 1.5–2.5)

## 2018-04-19 LAB — VITAMIN D 25 HYDROXY (VIT D DEFICIENCY, FRACTURES): Vit D, 25-Hydroxy: 60 ng/mL (ref 30–100)

## 2018-04-19 LAB — TSH: TSH: 2.22 mIU/L

## 2018-04-21 ENCOUNTER — Other Ambulatory Visit: Payer: Self-pay | Admitting: Adult Health

## 2018-04-21 DIAGNOSIS — F411 Generalized anxiety disorder: Secondary | ICD-10-CM

## 2018-05-08 MED ORDER — TRAZODONE HCL 50 MG PO TABS: ORAL_TABLET | ORAL | 2 refills | Status: DC

## 2018-05-08 MED ORDER — SERTRALINE HCL 50 MG PO TABS: 50.0000 mg | ORAL_TABLET | Freq: Every day | ORAL | 2 refills | Status: DC

## 2018-05-16 DIAGNOSIS — B373 Candidiasis of vulva and vagina: Secondary | ICD-10-CM

## 2018-05-16 NOTE — Telephone Encounter (Signed)
Patient has history of recurrent yeast infection, calling with symptoms of discharge, itching. She denies any new sexual contacts. Will send in diflucan, she will call if she does not get better.   Between 15-20 minutes of counseling, chart review, and critical decision making was performed

## 2018-05-17 ENCOUNTER — Other Ambulatory Visit: Payer: Self-pay | Admitting: Physician Assistant

## 2018-05-29 ENCOUNTER — Other Ambulatory Visit: Payer: Self-pay | Admitting: Physician Assistant

## 2018-05-30 ENCOUNTER — Other Ambulatory Visit: Payer: Self-pay | Admitting: Physician Assistant

## 2018-05-30 DIAGNOSIS — F411 Generalized anxiety disorder: Secondary | ICD-10-CM

## 2018-06-24 ENCOUNTER — Other Ambulatory Visit: Payer: Self-pay | Admitting: Physician Assistant

## 2018-06-24 DIAGNOSIS — F411 Generalized anxiety disorder: Secondary | ICD-10-CM

## 2018-07-15 ENCOUNTER — Other Ambulatory Visit: Payer: Self-pay | Admitting: Internal Medicine

## 2018-07-15 DIAGNOSIS — F3341 Major depressive disorder, recurrent, in partial remission: Secondary | ICD-10-CM

## 2018-07-15 DIAGNOSIS — E6609 Other obesity due to excess calories: Secondary | ICD-10-CM

## 2018-07-20 ENCOUNTER — Other Ambulatory Visit: Payer: Self-pay | Admitting: Physician Assistant

## 2018-07-20 MED ORDER — AMITRIPTYLINE HCL 50 MG PO TABS: ORAL_TABLET | ORAL | 0 refills | Status: DC

## 2018-07-20 NOTE — Addendum Note (Signed)
Addended by: Vladimir Crofts on: 07/20/2018 08:42 AM   Modules accepted: Orders

## 2018-08-05 DIAGNOSIS — B3731 Acute candidiasis of vulva and vagina: Secondary | ICD-10-CM

## 2018-08-05 DIAGNOSIS — B373 Candidiasis of vulva and vagina: Secondary | ICD-10-CM

## 2018-08-06 MED ORDER — FLUCONAZOLE 150 MG PO TABS: 150.0000 mg | ORAL_TABLET | Freq: Every day | ORAL | 3 refills | Status: DC

## 2018-08-09 ENCOUNTER — Encounter: Payer: Self-pay | Admitting: *Deleted

## 2018-08-16 NOTE — Progress Notes (Signed)
Assessment and Plan:   Essential hypertension - continue medications, DASH diet, exercise and monitor at home. Call if greater than 130/80.  -     CBC with Differential/Platelet -     COMPLETE METABOLIC PANEL WITH GFR -     TSH  Insulin resistance Discussed disease progression and risks Discussed diet/exercise, weight management and risk modification -     Hemoglobin A1c  Hypercholesteremia check lipids decrease fatty foods increase activity.  -     Lipid panel  Depression, major, recurrent, in partial remission ([place]) Patient thinks she has PTSD, will stop wellbutrin, continue lexapro- may need to change to zoloft, encouraged to follow up with psych, no SI/HI  Medication management -     Magnesium  Vitamin D deficiency -     VITAMIN D 25 Hydroxy (Vit-D Deficiency, Fractures)  Iron deficiency -     Iron,Total/Total Iron Binding Cap -     Ferritin -     Vitamin B12  Chest pain, unspecified type -     EKG 12-Lead - atypical, likely due to chololithiasis/stress however with risk factors, age, anxiety will refer for calcium coronary score versus POST  Right upper quadrant pain -     Amylase -     US Abdomen Complete; Future - rule out gallstones, if not better will refer to GI  Chronic idiopathic constipation Given samples of linzess, may need to follow up with GI, diet discussed   Panic attacks Patient thinks she has PTSD, will stop wellbutrin, continue lexapro- may need to change to zoloft, encouraged to follow up with psych, no SI/HI Advised needs respite from her mother that moved here from [place] after husband/patient's father died  Continue diet and meds as discussed. Further disposition pending results of labs. Over 60 minutes of exam, counseling, chart review, and critical decision making was performed  Future Appointments  Date Time Provider [place]  12/12/2018  3:00 PM Vicie Mutters, PA-C [facility] None     HPI 53 y.o. female  presents  for 3 month follow up on hypertension, cholesterol, prediabetes, obesity, and vitamin D deficiency.   Patient was drinking coffee when had substernal pressure x 20 mins, no SOB, no accompaniments with it in Dec. Has happened again 2 weeks ago, woke her up from sleep, non exertional. Echo 2007. She is able to push mow her yard without chest pain. She has no family history.   She states she has had constipation, will pass gas, states any red meat/pork will have pain in her right side. No known tick bites. No dark black stool, no blood in stool. Had colonoscopy in 2018. She still has gallbladder.    Her blood pressure has been controlled at home,  today their BP is BP: (!) 140/96.  She is on lexapro 20mg  1/2 a pill a day and having some depression, she is on xanax 3 x a week, she is on wellbutrin. She states she will have some nightmares at night, will have day episodes of flash backs of neal, her step dad's eyes, after an emobili, had divorce, had dog put down, mom with dementia moved in with her.    She does not workout. She denies chest pain, dizziness, SOB.   She is not on cholesterol medication. Her cholesterol is at goal. The cholesterol last visit was:   Lab Results  Component Value Date   CHOL 185 04/18/2018   HDL 41 (L) 04/18/2018   LDLCALC 115 (H) 04/18/2018   TRIG 170 (  H) 04/18/2018   CHOLHDL 4.5 04/18/2018    She has been working on diet and exercise for prediabetes with insulin resistance due to obesity, and denies paresthesia of the feet, polydipsia, polyuria and visual disturbances. Last A1C in the office was:  Lab Results  Component Value Date   HGBA1C 5.5 11/30/2017   Patient is on Vitamin D supplement.   Lab Results  Component Value Date   VD25OH 60 04/18/2018     BMI is Body mass index is 42.77 kg/m., she is working on diet and exercise. She has been drinking less and working out at night.  Wt Readings from Last 3 Encounters:  08/17/18 257 lb (116.6 kg)  04/18/18 253  lb (114.8 kg)  01/11/18 262 lb (118.8 kg)    Current Medications:  Current Outpatient Medications on File Prior to Visit  Medication Sig Dispense Refill  . ALPRAZolam (XANAX) 0.5 MG tablet TAKE 1 TABLET BY MOUTH TWICE DAILY AS NEEDED FOR FLYING 30 tablet 0  . amitriptyline (ELAVIL) 50 MG tablet TAKE 1/2-1 TABLET BY MOUTH EVERY NIGHT AT BEDTIME AS NEEDED FOR SLEEP 180 tablet 0  . buPROPion (WELLBUTRIN XL) 150 MG 24 hr tablet TAKE ONE TABLET BY MOUTH EVERY MORNING 90 tablet 0  . Diclofenac Sodium 2 % SOLN Place 2 g onto the skin 2 (two) times daily. 112 g 3  . escitalopram (LEXAPRO) 20 MG tablet TAKE 1 TABLET(20 MG) BY MOUTH DAILY 30 tablet 2  . etonogestrel (NEXPLANON) 68 MG IMPL implant 1 each by Subdermal route once.    . fluconazole (DIFLUCAN) 150 MG tablet Take 1 tablet (150 mg total) by mouth daily. 1 tablet 3  . furosemide (LASIX) 40 MG tablet Take 1 tablet (40 mg total) by mouth daily. 30 tablet 11  . IRON PO Take by mouth daily. Reported on 05/28/2015    . MAGNESIUM PO Take by mouth daily. Reported on 06/27/2015    . promethazine (PHENERGAN) 25 MG tablet TAKE 1 TABLET (25 MG TOTAL) BY MOUTH EVERY 6 (SIX) HOURS AS NEEDED FOR NAUSEA OR VOMITING. MAX: 4 TABLETS PER DAY 30 tablet 0  . rosuvastatin (CRESTOR) 10 MG tablet TAKE ONE TABLET BY MOUTH DAILY 90 tablet 0  . valACYclovir (VALTREX) 500 MG tablet Take 1 tablet (500 mg total) by mouth 2 (two) times daily. (Patient taking differently: Take 500 mg by mouth as needed. ) 180 tablet 1  . Vitamin D, Ergocalciferol, (DRISDOL) 50000 units CAPS capsule TAKE ONE CAPSULE BY MOUTH THREE TIMES A WEEK AS DIRECTED (Patient taking differently: TAKE ONE CAPSULE BY MOUTH ONECE A WEEK AS DIRECTED) 12 capsule PRN   Current Facility-Administered Medications on File Prior to Visit  Medication Dose Route Frequency Provider Last Rate Last Dose  . 0.9 %  sodium chloride infusion  500 mL Intravenous Continuous Armbruster, Carlota Raspberry, MD       Medical History:   Past Medical History:  Diagnosis Date  . Allergy   . Anemia   . Anxiety   . ASCUS (atypical squamous cells of undetermined significance) on Pap smear    neg HR HPV 09/2008, ascus 06/2009,10/2009  . HSV-2 infection   . Hypercholesteremia   . Hypertension   . OSA on CPAP   . Pes planus of both feet 06/20/2013  . Prediabetes   . Sleep apnea    Allergies:  Allergies  Allergen Reactions  . Doxycycline Hives and Shortness Of Breath  . Ace Inhibitors     cough  . Adhesive [  Tape]   . Augmentin [Amoxicillin-Pot Clavulanate] Nausea Only    diarrhea  . Clindamycin/Lincomycin   . Epinephrine-Lidocaine-Na Metabisulfite [Lidocaine-Epinephrine]     Has heart palpitations from the epi, wants without  . Vicodin [Hydrocodone-Acetaminophen] Itching    Review of Systems:  Review of Systems  Constitutional: Positive for malaise/fatigue. Negative for chills, diaphoresis, fever and weight loss.  HENT: Negative.   Eyes: Negative.   Respiratory: Negative for cough, hemoptysis, sputum production, shortness of breath and wheezing.   Cardiovascular: Positive for chest pain (with radiation into her jaw). Negative for palpitations, orthopnea, claudication, leg swelling and PND.  Gastrointestinal: Positive for abdominal pain and nausea.  Genitourinary: Negative.   Musculoskeletal: Negative for back pain, falls, joint pain, myalgias and neck pain.  Skin: Negative.   Neurological: Negative.  Negative for weakness.  Psychiatric/Behavioral: Negative.     Family History  Problem Relation Age of Onset  . Hypertension Maternal Grandmother   . Breast cancer Maternal Grandmother 80  . Emphysema Maternal Grandmother   . Hypertension Mother   . Hyperlipidemia Mother   . Cancer Mother        Skin  . Colon cancer Neg Hx     Social history- Review and unchanged Physical Exam: BP (!) 140/96   Pulse 82   Temp 98.1 F (36.7 C)   Ht 5\' 5"  (1.651 m)   Wt 257 lb (116.6 kg)   SpO2 98%   BMI 42.77 kg/m   Wt Readings from Last 3 Encounters:  08/17/18 257 lb (116.6 kg)  04/18/18 253 lb (114.8 kg)  01/11/18 262 lb (118.8 kg)   General Appearance: Well nourished, in no apparent distress. Eyes: PERRLA, EOMs, conjunctiva no swelling or erythema Sinuses: No Frontal/maxillary tenderness ENT/Mouth: Ext aud canals clear, TMs without erythema, bulging. No erythema, swelling, or exudate on post pharynx.  Tonsils not swollen or erythematous. Hearing normal.  Neck: Supple, thyroid normal.  Respiratory: Respiratory effort normal, BS equal bilaterally without rales, rhonchi, wheezing or stridor.  Cardio: RRR with no MRGs. Brisk peripheral pulses without edema.  Abdomen: Soft, + BS,  Tender RUQ neg murphys, no guarding, rebound, hernias, masses. Lymphatics: Non tender without lymphadenopathy.  Musculoskeletal: Full ROM, 5/5 strength, Normal gait Skin: Warm, dry without rashes, lesions, ecchymosis.  Neuro: Cranial nerves intact. Normal muscle tone, no cerebellar symptoms. Psych: Awake and oriented X 3, normal affect, Insight and Judgment appropriate. Patient tearful.    Vicie Mutters, PA-C 11:04 AM [facility]

## 2018-08-17 ENCOUNTER — Encounter: Payer: Self-pay | Admitting: Physician Assistant

## 2018-08-17 ENCOUNTER — Other Ambulatory Visit: Payer: Self-pay

## 2018-08-17 ENCOUNTER — Ambulatory Visit (INDEPENDENT_AMBULATORY_CARE_PROVIDER_SITE_OTHER): Payer: BC Managed Care – PPO | Admitting: Physician Assistant

## 2018-08-17 VITALS — BP 140/96 | HR 82 | Temp 98.1°F | Ht 65.0 in | Wt 257.0 lb

## 2018-08-17 DIAGNOSIS — E8881 Metabolic syndrome: Secondary | ICD-10-CM

## 2018-08-17 DIAGNOSIS — F410 Panic disorder [episodic paroxysmal anxiety] without agoraphobia: Secondary | ICD-10-CM | POA: Diagnosis not present

## 2018-08-17 DIAGNOSIS — Z79899 Other long term (current) drug therapy: Secondary | ICD-10-CM | POA: Diagnosis not present

## 2018-08-17 DIAGNOSIS — E611 Iron deficiency: Secondary | ICD-10-CM | POA: Diagnosis not present

## 2018-08-17 DIAGNOSIS — E559 Vitamin D deficiency, unspecified: Secondary | ICD-10-CM | POA: Diagnosis not present

## 2018-08-17 DIAGNOSIS — F3341 Major depressive disorder, recurrent, in partial remission: Secondary | ICD-10-CM

## 2018-08-17 DIAGNOSIS — E7800 Pure hypercholesterolemia, unspecified: Secondary | ICD-10-CM | POA: Diagnosis not present

## 2018-08-17 DIAGNOSIS — K5904 Chronic idiopathic constipation: Secondary | ICD-10-CM

## 2018-08-17 DIAGNOSIS — R079 Chest pain, unspecified: Secondary | ICD-10-CM

## 2018-08-17 DIAGNOSIS — I10 Essential (primary) hypertension: Secondary | ICD-10-CM

## 2018-08-17 DIAGNOSIS — R1011 Right upper quadrant pain: Secondary | ICD-10-CM | POA: Diagnosis not present

## 2018-08-17 NOTE — Patient Instructions (Addendum)
YOU CAN CALL TO MAKE AN ULTRASOUND..  I have put in an order for an ultrasound for you to have You can set them up at your convenience by calling this number [phone] You will likely have the ultrasound at [place] [facility]  If you have any issues call our office and we will set this up for you.   Stop the wellbutrin x 1 month at least If not doing well still can change to zoloft Go to therapy  I suggest calling your insurance and finding out who is in your network and THEN calling those people or looking them up on google.   I'm a big fan of Cognitive Behavioral Therapy, look this up on You tube or check with the therapist you see if they are certified.  This form of therapy helps to teach you skills to better handle with current situation that are causing anxiety or depression.   There are some great apps too Check out [employer], give thanks app.  Meditations apps are great like headspace.    Go to the ER if any chest pain, shortness of breath, nausea, dizziness, severe HA, changes vision/speech   Cholelithiasis  Cholelithiasis is a form of gallbladder disease in which gallstones form in the gallbladder. The gallbladder is an organ that stores bile. Bile is made in the liver, and it helps to digest fats. Gallstones begin as small crystals and slowly grow into stones. They may cause no symptoms until the gallbladder tightens (contracts) and a gallstone is blocking the duct (gallbladder attack), which can cause pain. Cholelithiasis is also referred to as gallstones. There are two main types of gallstones:  Cholesterol stones. These are made of hardened cholesterol and are usually yellow-green in color. They are the most common type of gallstone. Cholesterol is a white, waxy, fat-like substance that is made in the liver.  Pigment stones. These are dark in color and are made of a red-yellow substance that forms when hemoglobin from red blood cells breaks down (bilirubin). What  are the causes? This condition may be caused by an imbalance in the substances that bile is made of. This can happen if the bile:  Has too much bilirubin.  Has too much cholesterol.  Does not have enough bile salts. These salts help the body absorb and digest fats. In some cases, this condition can also be caused by the gallbladder not emptying completely or often enough. What increases the risk? The following factors may make you more likely to develop this condition:  Being female.  Having multiple pregnancies. Health care providers sometimes advise removing diseased gallbladders before future pregnancies.  Eating a diet that is heavy in fried foods, fat, and refined carbohydrates, like white bread and white rice.  Being obese.  Being older than age 81.  Prolonged use of medicines that contain female hormones (estrogen).  Having diabetes mellitus.  Rapidly losing weight.  Having a family history of gallstones.  Being of [place] or [place] descent.  Having an intestinal disease such as Crohn disease.  Having metabolic syndrome.  Having cirrhosis.  Having severe types of anemia such as sickle cell anemia. What are the signs or symptoms? In most cases, there are no symptoms. These are known as silent gallstones. If a gallstone blocks the bile ducts, it can cause a gallbladder attack. The main symptom of a gallbladder attack is sudden pain in the upper right abdomen. The pain usually comes at night or after eating  a large meal. The pain can last for one or several hours and can spread to the right shoulder or chest. If the bile duct is blocked for more than a few hours, it can cause infection or inflammation of the gallbladder, liver, or pancreas, which may cause:  Nausea.  Vomiting.  Abdominal pain that lasts for 5 hours or more.  Fever or chills.  Yellowing of the skin or the whites of the eyes (jaundice).  Dark urine.  Light-colored stools. How is this  diagnosed? This condition may be diagnosed based on:  A physical exam.  Your medical history.  An ultrasound of your gallbladder.  CT scan.  MRI.  Blood tests to check for signs of infection or inflammation.  A scan of your gallbladder and bile ducts (biliary system) using nonharmful radioactive material and special cameras that can see the radioactive material (cholescintigram). This test checks to see how your gallbladder contracts and whether bile ducts are blocked.  Inserting a small tube with a camera on the end (endoscope) through your mouth to inspect bile ducts and check for blockages (endoscopic retrograde cholangiopancreatogram). How is this treated? Treatment for gallstones depends on the severity of the condition. Silent gallstones do not need treatment. If the gallstones cause a gallbladder attack or other symptoms, treatment may be required. Options for treatment include:  Surgery to remove the gallbladder (cholecystectomy). This is the most common treatment.  Medicines to dissolve gallstones. These are most effective at treating small gallstones. You may need to take medicines for up to 6-12 months.  Shock wave treatment (extracorporeal biliary lithotripsy). In this treatment, an ultrasound machine sends shock waves to the gallbladder to break gallstones into smaller pieces. These pieces can then be passed into the intestines or be dissolved by medicine. This is rarely used.  Removing gallstones through endoscopic retrograde cholangiopancreatogram. A small basket can be attached to the endoscope and used to capture and remove gallstones. Follow these instructions at home:  Take over-the-counter and prescription medicines only as told by your health care provider.  Maintain a healthy weight and follow a healthy diet. This includes: ? Reducing fatty foods, such as fried food. ? Reducing refined carbohydrates, like white bread and white rice. ? Increasing fiber. Aim for  foods like almonds, fruit, and beans.  Keep all follow-up visits as told by your health care provider. This is important. Contact a health care provider if:  You think you have had a gallbladder attack.  You have been diagnosed with silent gallstones and you develop abdominal pain or indigestion. Get help right away if:  You have pain from a gallbladder attack that lasts for more than 2 hours.  You have abdominal pain that lasts for more than 5 hours.  You have a fever or chills.  You have persistent nausea and vomiting.  You develop jaundice.  You have dark urine or light-colored stools. Summary  Cholelithiasis (also called gallstones) is a form of gallbladder disease in which gallstones form in the gallbladder.  This condition is caused by an imbalance in the substances that make up bile. This can happen if the bile has too much cholesterol, too much bilirubin, or not enough bile salts.  You are more likely to develop this condition if you are female, pregnant, using medicines with estrogen, obese, older than age 61, or have a family history of gallstones. You may also develop gallstones if you have diabetes, an intestinal disease, cirrhosis, or metabolic syndrome.  Treatment for gallstones depends  on the severity of the condition. Silent gallstones do not need treatment.  If gallstones cause a gallbladder attack or other symptoms, treatment may be needed. The most common treatment is surgery to remove the gallbladder. This information is not intended to replace advice given to you by your health care provider. Make sure you discuss any questions you have with your health care provider. Document Released: 02/05/2005 Document Revised: 08/10/2016 Document Reviewed: 10/27/2015 Elsevier Interactive Patient Education  2019 [employer].

## 2018-08-18 LAB — TSH: TSH: 1.61 mIU/L

## 2018-08-18 LAB — COMPLETE METABOLIC PANEL WITH GFR
AG Ratio: 1.6 (calc) (ref 1.0–2.5)
ALT: 15 U/L (ref 6–29)
AST: 14 U/L (ref 10–35)
Albumin: 4.6 g/dL (ref 3.6–5.1)
Alkaline phosphatase (APISO): 95 U/L (ref 37–153)
BUN: 18 mg/dL (ref 7–25)
CO2: 29 mmol/L (ref 20–32)
Calcium: 9.3 mg/dL (ref 8.6–10.4)
Chloride: 103 mmol/L (ref 98–110)
Creat: 1.02 mg/dL (ref 0.50–1.05)
GFR, Est African American: 73 mL/min/{1.73_m2} (ref >=60)
GFR, Est Non African American: 63 mL/min/{1.73_m2} (ref >=60)
Globulin: 2.8 g/dL (calc) (ref 1.9–3.7)
Glucose, Bld: 95 mg/dL (ref 65–99)
Potassium: 3.9 mmol/L (ref 3.5–5.3)
Sodium: 140 mmol/L (ref 135–146)
Total Bilirubin: 0.6 mg/dL (ref 0.2–1.2)
Total Protein: 7.4 g/dL (ref 6.1–8.1)

## 2018-08-18 LAB — CBC WITH DIFFERENTIAL/PLATELET
Absolute Monocytes: 322 cells/uL (ref 200–950)
Basophils Absolute: 62 cells/uL (ref 0–200)
Basophils Relative: 1 %
Eosinophils Absolute: 93 cells/uL (ref 15–500)
Eosinophils Relative: 1.5 %
HCT: 40.8 % (ref 35.0–45.0)
Hemoglobin: 13.3 g/dL (ref 11.7–15.5)
Lymphs Abs: 1246 cells/uL (ref 850–3900)
MCH: 28 pg (ref 27.0–33.0)
MCHC: 32.6 g/dL (ref 32.0–36.0)
MCV: 85.9 fL (ref 80.0–100.0)
MPV: 11.5 fL (ref 7.5–12.5)
Monocytes Relative: 5.2 %
Neutro Abs: 4476 cells/uL (ref 1500–7800)
Neutrophils Relative %: 72.2 %
Platelets: 252 10*3/uL (ref 140–400)
RBC: 4.75 10*6/uL (ref 3.80–5.10)
RDW: 12.8 % (ref 11.0–15.0)
Total Lymphocyte: 20.1 %
WBC: 6.2 10*3/uL (ref 3.8–10.8)

## 2018-08-18 LAB — VITAMIN D 25 HYDROXY (VIT D DEFICIENCY, FRACTURES): Vit D, 25-Hydroxy: 57 ng/mL (ref 30–100)

## 2018-08-18 LAB — LIPID PANEL
Cholesterol: 182 mg/dL (ref <200)
HDL: 50 mg/dL (ref >=50)
LDL Cholesterol (Calc): 108 mg/dL (calc) — ABNORMAL HIGH
Non-HDL Cholesterol (Calc): 132 mg/dL (calc) — ABNORMAL HIGH (ref <130)
Total CHOL/HDL Ratio: 3.6 (calc) (ref <5.0)
Triglycerides: 125 mg/dL (ref <150)

## 2018-08-18 LAB — AMYLASE: Amylase: 41 U/L (ref 21–101)

## 2018-08-18 LAB — VITAMIN B12: Vitamin B-12: 335 pg/mL (ref 200–1100)

## 2018-08-18 LAB — IRON, TOTAL/TOTAL IRON BINDING CAP
%SAT: 27 % (calc) (ref 16–45)
Iron: 103 ug/dL (ref 45–160)
TIBC: 383 mcg/dL (calc) (ref 250–450)

## 2018-08-18 LAB — HEMOGLOBIN A1C
Hgb A1c MFr Bld: 5.4 % of total Hgb (ref <5.7)
Mean Plasma Glucose: 108 (calc)
eAG (mmol/L): 6 (calc)

## 2018-08-18 LAB — MAGNESIUM: Magnesium: 2 mg/dL (ref 1.5–2.5)

## 2018-08-18 LAB — FERRITIN: Ferritin: 96 ng/mL (ref 16–232)

## 2018-08-24 DIAGNOSIS — K760 Fatty (change of) liver, not elsewhere classified: Secondary | ICD-10-CM

## 2018-08-24 HISTORY — DX: Fatty (change of) liver, not elsewhere classified: K76.0

## 2018-08-28 ENCOUNTER — Encounter: Payer: Self-pay | Admitting: Family Medicine

## 2018-08-29 MED ORDER — DICLOFENAC SODIUM 2 % TD SOLN: 2.0000 g | Freq: Two times a day (BID) | TRANSDERMAL | 0 refills | Status: DC

## 2018-09-01 ENCOUNTER — Ambulatory Visit
Admission: RE | Admit: 2018-09-01 | Discharge: 2018-09-01 | Disposition: A | Discharge status: Home / Self Care | Payer: BLUE CROSS/BLUE SHIELD | Source: Ambulatory Visit | Attending: Physician Assistant | Admitting: Physician Assistant

## 2018-09-01 ENCOUNTER — Other Ambulatory Visit: Payer: Self-pay | Admitting: Physician Assistant

## 2018-09-01 DIAGNOSIS — F411 Generalized anxiety disorder: Secondary | ICD-10-CM

## 2018-09-01 DIAGNOSIS — R1011 Right upper quadrant pain: Secondary | ICD-10-CM

## 2018-09-12 MED ORDER — LINACLOTIDE 145 MCG PO CAPS: 145.0000 ug | ORAL_CAPSULE | Freq: Every day | ORAL | 4 refills | Status: DC

## 2018-09-16 ENCOUNTER — Other Ambulatory Visit: Payer: Self-pay | Admitting: Internal Medicine

## 2018-09-16 MED ORDER — LINACLOTIDE 145 MCG PO CAPS: ORAL_CAPSULE | ORAL | 3 refills | Status: DC

## 2018-09-17 ENCOUNTER — Other Ambulatory Visit: Payer: Self-pay | Admitting: Internal Medicine

## 2018-09-21 ENCOUNTER — Telehealth: Payer: BC Managed Care – PPO

## 2018-09-21 DIAGNOSIS — L03213 Periorbital cellulitis: Secondary | ICD-10-CM | POA: Diagnosis not present

## 2018-09-21 MED ORDER — FLUCONAZOLE 150 MG PO TABS: 150.0000 mg | ORAL_TABLET | Freq: Every day | ORAL | 3 refills | Status: DC

## 2018-09-21 MED ORDER — SULFAMETHOXAZOLE-TRIMETHOPRIM 800-160 MG PO TABS: 1.0000 | ORAL_TABLET | Freq: Two times a day (BID) | ORAL | 0 refills | Status: DC

## 2018-09-21 NOTE — Telephone Encounter (Signed)
THIS ENCOUNTER IS A VIRTUAL VISIT DUE TO COVID-19 - PATIENT WAS NOT SEEN IN THE OFFICE.  PATIENT HAS CONSENTED TO VIRTUAL VISIT / TELEMEDICINE VISIT   Virtual Visit via video Note  I connected with Wendy Levine on 09/21/2018 by video.  I verified that I am speaking with the correct person using two identifiers.    I discussed the limitations of evaluation and management by telemedicine and the availability of in person appointments. The patient expressed understanding and agreed to proceed.  History of Present Illness: 53 y.o. WF calls with eye swelling.  States she picked at a mole on her right lower eye and started to have swelling. Now has swelling, redness around her right eye, no warmth, no discharge. She denies fever, chills, blurry vision, neck pain, headache.  Had recent skin check with Derm.     Medications  Current Outpatient Medications (Endocrine & Metabolic):  .  etonogestrel (NEXPLANON) 68 MG IMPL implant, 1 each by Subdermal route once.  Current Outpatient Medications (Cardiovascular):  .  furosemide (LASIX) 40 MG tablet, Take 1 tablet (40 mg total) by mouth daily. .  rosuvastatin (CRESTOR) 10 MG tablet, Take 1 tablet Daily for Cholesterol  Current Outpatient Medications (Respiratory):  .  promethazine (PHENERGAN) 25 MG tablet, TAKE 1 TABLET (25 MG TOTAL) BY MOUTH EVERY 6 (SIX) HOURS AS NEEDED FOR NAUSEA OR VOMITING. MAX: 4 TABLETS PER DAY   Current Outpatient Medications (Hematological):  Marland Kitchen  IRON PO, Take by mouth daily. Reported on 05/28/2015  Current Outpatient Medications (Other):  Marland Kitchen  ALPRAZolam (XANAX) 0.5 MG tablet, TAKE 1 TABLET BY MOUTH TWICE DAILY AS NEEDED FOR FLYING .  amitriptyline (ELAVIL) 50 MG tablet, TAKE 1/2-1 TABLET BY MOUTH EVERY NIGHT AT BEDTIME AS NEEDED FOR SLEEP .  buPROPion (WELLBUTRIN XL) 150 MG 24 hr tablet, TAKE ONE TABLET BY MOUTH EVERY MORNING .  Diclofenac Sodium 2 % SOLN, Place 2 g onto the skin 2 (two) times daily. Marland Kitchen   escitalopram (LEXAPRO) 20 MG tablet, TAKE 1 TABLET(20 MG) BY MOUTH DAILY .  fluconazole (DIFLUCAN) 150 MG tablet, Take 1 tablet (150 mg total) by mouth daily. Marland Kitchen  linaclotide (LINZESS) 145 MCG CAPS capsule, Take 1 capsule every morning for Constipation .  MAGNESIUM PO, Take by mouth daily. Reported on 06/27/2015 .  valACYclovir (VALTREX) 500 MG tablet, Take 1 tablet (500 mg total) by mouth 2 (two) times daily. (Patient taking differently: Take 500 mg by mouth as needed. ) .  Vitamin D, Ergocalciferol, (DRISDOL) 50000 units CAPS capsule, TAKE ONE CAPSULE BY MOUTH THREE TIMES A WEEK AS DIRECTED (Patient taking differently: TAKE ONE CAPSULE BY MOUTH ONECE A WEEK AS DIRECTED)  Problem list She has Hypercholesteremia; Hypertension; HSV-2 infection; Primary localized osteoarthrosis, lower leg; Obesity; Insulin resistance; OSA (obstructive sleep apnea); Vitamin D deficiency; Medication management; Anemia; Depression, major, recurrent, in partial remission ([place]); Cervical radiculopathy at C6; and Hip flexor tightness on their problem list.   Observations/Objective: General Appearance: Well nourished well developed, in no apparent distress.  Eyes: conjunctiva erythema on the right eye without discharge, with periorbital swelling and erythema, EOM entact all directions.  ENT/Mouth: No hoarseness, No cough for duration of visit.  Neck: Supple  Respiratory: Respiratory effort normal, normal rate, no retractions or distress.   Cardio: Appears well-perfused, noncyanotic Musculoskeletal: no obvious deformity Skin: visible skin with right corner of her eye with erythema and circular red dot, no other rashes, ecchymosis, erythema Neuro: Awake and oriented X 3,  Psych:  normal  affect, Insight and Judgment appropriate.   Assessment and Plan: Diagnoses and all orders for this visit:  Periorbital cellulitis of right eye -     sulfamethoxazole-trimethoprim (BACTRIM DS) 800-160 MG tablet; Take 1 tablet by mouth 2  (two) times daily. -     fluconazole (DIFLUCAN) 150 MG tablet; Take 1 tablet (150 mg total) by mouth daily.  Take ABX Warm compresses  If the mole is not improved needs checked here or at derm The patient was advised to call immediately if she has any concerning symptoms in the interval. The patient voices understanding of current treatment options and is in agreement with the current care plan.The patient knows to call the clinic with any problems, questions or concerns or go to the ER if any further progression of symptoms.     Follow Up Instructions:  I discussed the assessment and treatment plan with the patient. The patient was provided an opportunity to ask questions and all were answered. The patient agreed with the plan and demonstrated an understanding of the instructions.   The patient was advised to call back or seek an in-person evaluation if the symptoms worsen or if the condition fails to improve as anticipated.  I provided 15 minutes of non-face-to-face time during this encounter.   Vicie Mutters, PA-C

## 2018-09-23 ENCOUNTER — Other Ambulatory Visit: Payer: Self-pay | Admitting: Physician Assistant

## 2018-09-23 MED ORDER — LEVOFLOXACIN 500 MG PO TABS: 500.0000 mg | ORAL_TABLET | Freq: Every day | ORAL | 0 refills | Status: DC

## 2018-09-25 ENCOUNTER — Encounter: Payer: Self-pay | Admitting: Internal Medicine

## 2018-09-25 ENCOUNTER — Other Ambulatory Visit: Payer: Self-pay | Admitting: Internal Medicine

## 2018-09-25 MED ORDER — CEPHALEXIN 500 MG PO CAPS: ORAL_CAPSULE | ORAL | 0 refills | Status: DC

## 2018-10-03 ENCOUNTER — Other Ambulatory Visit: Payer: Self-pay

## 2018-10-03 ENCOUNTER — Encounter: Payer: Self-pay | Admitting: Physician Assistant

## 2018-10-03 ENCOUNTER — Ambulatory Visit (INDEPENDENT_AMBULATORY_CARE_PROVIDER_SITE_OTHER): Payer: BC Managed Care – PPO | Admitting: Physician Assistant

## 2018-10-03 VITALS — BP 122/86 | HR 65 | Temp 97.5°F | Ht 65.0 in | Wt 263.4 lb

## 2018-10-03 DIAGNOSIS — Z6839 Body mass index (BMI) 39.0-39.9, adult: Secondary | ICD-10-CM | POA: Diagnosis not present

## 2018-10-03 DIAGNOSIS — F3341 Major depressive disorder, recurrent, in partial remission: Secondary | ICD-10-CM | POA: Diagnosis not present

## 2018-10-03 DIAGNOSIS — E6609 Other obesity due to excess calories: Secondary | ICD-10-CM

## 2018-10-03 DIAGNOSIS — L03213 Periorbital cellulitis: Secondary | ICD-10-CM

## 2018-10-03 MED ORDER — LINACLOTIDE 145 MCG PO CAPS: ORAL_CAPSULE | ORAL | 3 refills | Status: DC

## 2018-10-03 NOTE — Progress Notes (Signed)
Subjective:    Patient ID: Wendy Levine, female    DOB: March 27, 1965, 53 y.o.   MRN: [id number]  HPI 53 y.o. obese WF presents with facial swelling due to a mole.  Failed bactrim, levaquin and has been doing better with keflex.  She states the linzess samples worked well for constipation and pain in her left side however it was too expensive even with card.  Lab Results  Component Value Date   CHOL 182 08/17/2018   HDL 50 08/17/2018   LDLCALC 108 (H) 08/17/2018   TRIG 125 08/17/2018   CHOLHDL 3.6 08/17/2018    BMI is Body mass index is 43.83 kg/m., she is working on diet and exercise. She has been drinking wine at night and her consumption has increased.  Wt Readings from Last 3 Encounters:  10/03/18 263 lb 6.4 oz (119.5 kg)  08/17/18 257 lb (116.6 kg)  04/18/18 253 lb (114.8 kg)    Blood pressure 122/86, pulse 65, temperature (!) 97.5 F (36.4 C), height 5\' 5"  (1.651 m), weight 263 lb 6.4 oz (119.5 kg), SpO2 96 %.  Medications Current Outpatient Medications on File Prior to Visit  Medication Sig  . ALPRAZolam (XANAX) 0.5 MG tablet TAKE 1 TABLET BY MOUTH TWICE DAILY AS NEEDED FOR FLYING  . amitriptyline (ELAVIL) 50 MG tablet TAKE 1/2-1 TABLET BY MOUTH EVERY NIGHT AT BEDTIME AS NEEDED FOR SLEEP  . buPROPion (WELLBUTRIN XL) 150 MG 24 hr tablet TAKE ONE TABLET BY MOUTH EVERY MORNING  . Diclofenac Sodium 2 % SOLN Place 2 g onto the skin 2 (two) times daily.  Marland Kitchen escitalopram (LEXAPRO) 20 MG tablet TAKE 1 TABLET(20 MG) BY MOUTH DAILY  . etonogestrel (NEXPLANON) 68 MG IMPL implant 1 each by Subdermal route once.  . fluconazole (DIFLUCAN) 150 MG tablet Take 1 tablet (150 mg total) by mouth daily.  . furosemide (LASIX) 40 MG tablet Take 1 tablet (40 mg total) by mouth daily.  . IRON PO Take by mouth daily. Reported on 05/28/2015  . MAGNESIUM PO Take by mouth daily. Reported on 06/27/2015  . promethazine (PHENERGAN) 25 MG tablet TAKE 1 TABLET (25 MG TOTAL) BY MOUTH EVERY 6 (SIX)  HOURS AS NEEDED FOR NAUSEA OR VOMITING. MAX: 4 TABLETS PER DAY  . rosuvastatin (CRESTOR) 10 MG tablet Take 1 tablet Daily for Cholesterol  . valACYclovir (VALTREX) 500 MG tablet Take 1 tablet (500 mg total) by mouth 2 (two) times daily. (Patient taking differently: Take 500 mg by mouth as needed. )  . Vitamin D, Ergocalciferol, (DRISDOL) 50000 units CAPS capsule TAKE ONE CAPSULE BY MOUTH THREE TIMES A WEEK AS DIRECTED (Patient taking differently: TAKE ONE CAPSULE BY MOUTH ONECE A WEEK AS DIRECTED)  . cephALEXin (KEFLEX) 500 MG capsule Take 1 capsule 4 x/day after meals & bedtime for infection (Patient not taking: Reported on 10/03/2018)   No current facility-administered medications on file prior to visit.     Problem list She has Hypercholesteremia; Hypertension; HSV-2 infection; Primary localized osteoarthrosis, lower leg; Obesity; Insulin resistance; OSA (obstructive sleep apnea); Vitamin D deficiency; Medication management; Anemia; Depression, major, recurrent, in partial remission ([place]); Cervical radiculopathy at C6; and Hip flexor tightness on their problem list.   Review of Systems See HPI    Objective:   Physical Exam Constitutional:      Appearance: She is well-developed. She is obese. She is not ill-appearing.  HENT:     Head: Normocephalic and atraumatic.     Right Ear: External ear normal.  Left Ear: External ear normal.  Eyes:     Conjunctiva/sclera: Conjunctivae normal.     Pupils: Pupils are equal, round, and reactive to light.  Neck:     Musculoskeletal: Normal range of motion and neck supple.     Thyroid: No thyromegaly.  Cardiovascular:     Rate and Rhythm: Normal rate and regular rhythm.     Heart sounds: Normal heart sounds. No murmur. No friction rub. No gallop.   Pulmonary:     Effort: Pulmonary effort is normal. No respiratory distress.     Breath sounds: Normal breath sounds. No wheezing.  Abdominal:     General: Bowel sounds are normal. There is no  distension.     Palpations: Abdomen is soft. There is no mass.     Tenderness: There is no abdominal tenderness. There is no guarding or rebound.  Musculoskeletal: Normal range of motion.  Lymphadenopathy:     Cervical: No cervical adenopathy.  Skin:    General: Skin is warm and dry.     Comments: Right lower corner of her eye with 4 mm erythematous macule, no warmth, tenderness, telangiectasias, nodules, etc.   Neurological:     Mental Status: She is alert and oriented to person, place, and time.     Cranial Nerves: No cranial nerve deficit.     Coordination: Coordination normal.     Deep Tendon Reflexes: Reflexes normal.        Assessment & Plan:    Periorbital cellulitis of right eye Improved with keflex, finish ABX Area does not appear to be a basal/squamous cell, will monitor, call if not improved  Depression, major, recurrent, in partial remission (HCC) Continue medications  Class 2 obesity due to excess calories without serious comorbidity with body mass index (BMI) of 39.0 to 39.9 in adult - follow up 2 months for progress monitoring at [facility] - increase veggies, decrease carbs - long discussion about weight loss, diet, and exercise  Constipation Increase movement, increase veggies -     linaclotide (LINZESS) 145 MCG CAPS capsule; Take 1 capsule every morning for Constipation     Future Appointments  Date Time Provider [place]  10/11/2018 11:15 AM Vicie Mutters, PA-C [id number] None  12/12/2018  3:00 PM Vicie Mutters, PA-C [facility] None

## 2018-10-03 NOTE — Patient Instructions (Signed)
   Ask insurance and pharmacy about shingrix - new vaccine   Can go to https://www.cdc.gov/vaccines/vpd/shingles/public/shingrix/index.html for more information  Shingrix Vaccination  Two vaccines are licensed and recommended to prevent shingles in the [place] Zoster vaccine live (ZVL, Zostavax) has been in use since 2006. Recombinant zoster vaccine (RZV, Shingrix), has been in use since 2017 and is recommended by AC[employer] as the preferred shingles vaccine.  What Everyone Should Know about Shingles Vaccine (Shingrix) One of the Recommended Vaccines by Disease Shingles vaccination is the only way to protect against shingles and postherpetic neuralgia (PHN), the most common complication from shingles. CDC recommends that healthy adults 50 years and older get two doses of the shingles vaccine called Shingrix (recombinant zoster vaccine), separated by 2 to 6 months, to prevent shingles and the complications from the disease. Your doctor or pharmacist can give you Shingrix as a shot in your upper arm. Shingrix provides strong protection against shingles and PHN. Two doses of Shingrix is more than 90% effective at preventing shingles and PHN. Protection stays above 85% for at least the first four years after you get vaccinated. Shingrix is the preferred vaccine, over Zostavax (zoster vaccine live), a shingles vaccine in use since 2006. Zostavax may still be used to prevent shingles in healthy adults 60 years and older. For example, you could use Zostavax if a person is allergic to Shingrix, prefers Zostavax, or requests immediate vaccination and Shingrix is unavailable. Who Should Get Shingrix? Healthy adults 50 years and older should get two doses of Shingrix, separated by 2 to 6 months. You should get Shingrix even if in the past you . had shingles  . received Zostavax  . are not sure if you had chickenpox There is no maximum age for getting Shingrix. If you had shingles in the past, you can get  Shingrix to help prevent future occurrences of the disease. There is no specific length of time that you need to wait after having shingles before you can receive Shingrix, but generally you should make sure the shingles rash has gone away before getting vaccinated. You can get Shingrix whether or not you remember having had chickenpox in the past. Studies show that more than 99% of Americans 40 years and older have had chickenpox, even if they don't remember having the disease. Chickenpox and shingles are related because they are caused by the same virus (varicella zoster virus). After a person recovers from chickenpox, the virus stays dormant (inactive) in the body. It can reactivate years later and cause shingles. If you had Zostavax in the recent past, you should wait at least eight weeks before getting Shingrix. Talk to your healthcare provider to determine the best time to get Shingrix. Shingrix is available in doctor's offices and pharmacies. To find doctor's offices or pharmacies near you that offer the vaccine, visit HealthMap Vaccine FinderExternal. If you have questions about Shingrix, talk with your healthcare provider. Vaccine for Those 50 Years and Older  Shingrix reduces the risk of shingles and PHN by more than 90% in people 50 and older. CDC recommends the vaccine for healthy adults 50 and older.  Who Should Not Get Shingrix? You should not get Shingrix if you: . have ever had a severe allergic reaction to any component of the vaccine or after a dose of Shingrix  . tested negative for immunity to varicella zoster virus. If you test negative, you should get chickenpox vaccine.  . currently have shingles  . currently are pregnant or breastfeeding.   Women who are pregnant or breastfeeding should wait to get Shingrix.  . receive specific antiviral drugs (acyclovir, famciclovir, or valacyclovir) 24 hours before vaccination (avoid use of these antiviral drugs for 14 days after vaccination)-  zoster vaccine live only If you have a minor acute (starts suddenly) illness, such as a cold, you may get Shingrix. But if you have a moderate or severe acute illness, you should usually wait until you recover before getting the vaccine. This includes anyone with a temperature of 101.3F or higher. The side effects of the Shingrix are temporary, and usually last 2 to 3 days. While you may experience pain for a few days after getting Shingrix, the pain will be less severe than having shingles and the complications from the disease. How Well Does Shingrix Work? Two doses of Shingrix provides strong protection against shingles and postherpetic neuralgia (PHN), the most common complication of shingles. . In adults 50 to 52 years old who got two doses, Shingrix was 97% effective in preventing shingles; among adults 70 years and older, Shingrix was 91% effective.  . In adults 50 to 52 years old who got two doses, Shingrix was 91% effective in preventing PHN; among adults 70 years and older, Shingrix was 89% effective. Shingrix protection remained high (more than 85%) in people 70 years and older throughout the four years following vaccination. Since your risk of shingles and PHN increases as you get older, it is important to have strong protection against shingles in your older years. Top of Page  What Are the Possible Side Effects of Shingrix? Studies show that Shingrix is safe. The vaccine helps your body create a strong defense against shingles. As a result, you are likely to have temporary side effects from getting the shots. The side effects may affect your ability to do normal daily activities for 2 to 3 days. Most people got a sore arm with mild or moderate pain after getting Shingrix, and some also had redness and swelling where they got the shot. Some people felt tired, had muscle pain, a headache, shivering, fever, stomach pain, or nausea. About 1 out of 6 people who got Shingrix experienced side  effects that prevented them from doing regular activities. Symptoms went away on their own in about 2 to 3 days. Side effects were more common in younger people. You might have a reaction to the first or second dose of Shingrix, or both doses. If you experience side effects, you may choose to take over-the-counter pain medicine such as ibuprofen or acetaminophen. If you experience side effects from Shingrix, you should report them to the Vaccine Adverse Event Reporting System (VAERS). Your doctor might file this report, or you can do it yourself through the VAERS websiteExternal, or by calling 1-800-822-7967. If you have any questions about side effects from Shingrix, talk with your doctor. The shingles vaccine does not contain thimerosal (a preservative containing mercury). Top of Page  When Should I See a Doctor Because of the Side Effects I Experience From Shingrix? In clinical trials, Shingrix was not associated with serious adverse events. In fact, serious side effects from vaccines are extremely rare. For example, for every 1 million doses of a vaccine given, only one or two people may have a severe allergic reaction. Signs of an allergic reaction happen within minutes or hours after vaccination and include hives, swelling of the face and throat, difficulty breathing, a fast heartbeat, dizziness, or weakness. If you experience these or any other life-threatening symptoms, see a   doctor right away. Shingrix causes a strong response in your immune system, so it may produce short-term side effects more intense than you are used to from other vaccines. These side effects can be uncomfortable, but they are expected and usually go away on their own in 2 or 3 days. Top of Page  How Can I Pay For Shingrix? There are several ways shingles vaccine may be paid for: Medicare . Medicare Part D plans cover the shingles vaccine, but there may be a cost to you depending on your plan. There may be a copay for the  vaccine, or you may need to pay in full then get reimbursed for a certain amount.  . Medicare Part B does not cover the shingles vaccine. Medicaid . Medicaid may or may not cover the vaccine. Contact your insurer to find out. Private health insurance . Many private health insurance plans will cover the vaccine, but there may be a cost to you depending on your plan. Contact your insurer to find out. Vaccine assistance programs . Some pharmaceutical companies provide vaccines to eligible adults who cannot afford them. You may want to check with the vaccine manufacturer, [employer], about Shingrix. If you do not currently have health insurance, learn more about affordable health coverage optionsExternal. To find doctor's offices or pharmacies near you that offer the vaccine, visit HealthMap Vaccine FinderExternal.  

## 2018-10-11 ENCOUNTER — Ambulatory Visit: Payer: BC Managed Care – PPO | Admitting: Physician Assistant

## 2018-10-17 ENCOUNTER — Other Ambulatory Visit: Payer: Self-pay | Admitting: Physician Assistant

## 2018-10-17 DIAGNOSIS — F411 Generalized anxiety disorder: Secondary | ICD-10-CM

## 2018-10-24 ENCOUNTER — Other Ambulatory Visit: Payer: Self-pay | Admitting: Adult Health

## 2018-10-24 DIAGNOSIS — F411 Generalized anxiety disorder: Secondary | ICD-10-CM

## 2018-11-16 ENCOUNTER — Encounter: Payer: Self-pay | Admitting: Gynecology

## 2018-12-07 ENCOUNTER — Other Ambulatory Visit: Payer: Self-pay

## 2018-12-07 ENCOUNTER — Other Ambulatory Visit: Payer: Self-pay | Admitting: Physician Assistant

## 2018-12-07 DIAGNOSIS — F411 Generalized anxiety disorder: Secondary | ICD-10-CM

## 2018-12-07 MED ORDER — FUROSEMIDE 40 MG PO TABS: 40.0000 mg | ORAL_TABLET | Freq: Every day | ORAL | 1 refills | Status: DC

## 2018-12-07 MED ORDER — ESCITALOPRAM OXALATE 20 MG PO TABS: 20.0000 mg | ORAL_TABLET | Freq: Every day | ORAL | 1 refills | Status: DC

## 2018-12-07 NOTE — Progress Notes (Signed)
Future Appointments  Date Time Provider [place]  01/27/2019  9:00 AM Vicie Mutters, PA-C [facility] None

## 2018-12-07 NOTE — Progress Notes (Signed)
Needs rx to go to [facility], [facility]

## 2018-12-12 ENCOUNTER — Encounter: Payer: Self-pay | Admitting: Physician Assistant

## 2018-12-18 ENCOUNTER — Other Ambulatory Visit: Payer: Self-pay | Admitting: Physician Assistant

## 2018-12-18 DIAGNOSIS — F411 Generalized anxiety disorder: Secondary | ICD-10-CM

## 2018-12-19 ENCOUNTER — Other Ambulatory Visit: Payer: Self-pay | Admitting: Internal Medicine

## 2018-12-19 ENCOUNTER — Other Ambulatory Visit: Payer: Self-pay | Admitting: Adult Health

## 2018-12-19 DIAGNOSIS — F5101 Primary insomnia: Secondary | ICD-10-CM

## 2018-12-19 DIAGNOSIS — F411 Generalized anxiety disorder: Secondary | ICD-10-CM

## 2018-12-19 MED ORDER — VITAMIN D (ERGOCALCIFEROL) 1.25 MG (50000 UNIT) PO CAPS: ORAL_CAPSULE | ORAL | 3 refills | Status: DC

## 2018-12-19 MED ORDER — ALPRAZOLAM 0.5 MG PO TABS: ORAL_TABLET | ORAL | 0 refills | Status: DC

## 2018-12-19 MED ORDER — AMITRIPTYLINE HCL 50 MG PO TABS: ORAL_TABLET | ORAL | 1 refills | Status: DC

## 2019-01-27 ENCOUNTER — Encounter: Payer: BC Managed Care – PPO | Admitting: Physician Assistant

## 2019-02-05 ENCOUNTER — Other Ambulatory Visit: Payer: Self-pay | Admitting: Internal Medicine

## 2019-02-05 DIAGNOSIS — F411 Generalized anxiety disorder: Secondary | ICD-10-CM

## 2019-03-01 ENCOUNTER — Encounter: Payer: BC Managed Care – PPO | Admitting: Physician Assistant

## 2019-03-09 ENCOUNTER — Encounter: Payer: Self-pay | Admitting: Adult Health

## 2019-03-09 ENCOUNTER — Other Ambulatory Visit: Payer: Self-pay

## 2019-03-09 ENCOUNTER — Ambulatory Visit (INDEPENDENT_AMBULATORY_CARE_PROVIDER_SITE_OTHER): Payer: 59 | Admitting: Adult Health

## 2019-03-09 VITALS — BP 150/100 | HR 73 | Temp 97.3°F | Wt 265.0 lb

## 2019-03-09 DIAGNOSIS — M25512 Pain in left shoulder: Secondary | ICD-10-CM | POA: Diagnosis not present

## 2019-03-09 MED ORDER — MELOXICAM 15 MG PO TABS: ORAL_TABLET | ORAL | 1 refills | Status: DC

## 2019-03-09 NOTE — Progress Notes (Signed)
Assessment and Plan:  Wendy Levine was seen today for arm pain.  Diagnoses and all orders for this visit:  Acute pain of left shoulder Exam suggestive of impingement/rotator cuff tendonitis; pain but no weakness She has done well with mobic in the past Natural history and expected course discussed. Questions answered. Neurosurgeon distributed. Reduction in offending activity. Gentle ROM exercises. NSAIDs per medication orders. If not improving in 2 weeks she will follow up with Dr. Tamala Julian ([facility]) -     meloxicam (MOBIC) 15 MG tablet; Take one daily with food for 2 weeks, can take with tylenol, can not take with aleve, iburpofen, then as needed daily for pain  Further disposition pending results of labs. Discussed med's effects and SE's.   Over 15 minutes of exam, counseling, chart review, and critical decision making was performed.   Future Appointments  Date Time Provider [place]  04/06/2019 10:00 AM Vicie Mutters, PA-C [facility] None  02/29/2020 10:00 AM Vicie Mutters, PA-C [facility] None    ------------------------------------------------------------------------------------------------------------------   HPI BP (!) 150/100   Pulse 73   Temp (!) 97.3 F (36.3 C)   Wt 265 lb (120.2 kg)   SpO2 98%   BMI 44.10 kg/m   54 y.o.female with hx of htn, prediabetes, hyperlipidemia, obesity, C6 radiculopathy, cervical degenerative disc disease presents for evaluation of new onset lateral left shoulder pain.   She reports she woke up 2 days ago with left shoulder pain and stiffness; she tried some stretches and was feeling somewhat improved yesterday, but then feeling worse again yesterday evening and today. She reports pain is localized (lateral joint, deltoid) sharp, stabbing, positional, non-radiating; no pain in neutral position, worse with external rotation of shoulder, lateral abduction at 90 degrees. She reports any movement, rolling over on shoulder  severely uncomfortable at night and wakes her up. She denies numbness, weakness, paraesthesias. She denies history of pain in joint, injury or trauma. She denies neck pain.   She reports she had walked her husky 2 days prior and was very sore and achy through her neck/chest/shoulder worse on left.   She reports has tried heat application (seems to help), ice (improves pain when applying but worse stiffness following), aleve (minimal improvement), took old percocet last night which did allow her to sleep for 3 hours.   She is established with [facility] and Dr. Tamala Julian (sports med). She reports has taken mobic in the past with good results for her knees.    12/14/2017 neck xray:  IMPRESSION: Straightened alignment with degenerative disc disease at C5-6 and C6-7. Moderate foraminal narrowing bilaterally at these levels.  Past Medical History:  Diagnosis Date  . Allergy   . Anemia   . Anxiety   . ASCUS (atypical squamous cells of undetermined significance) on Pap smear    neg HR HPV 09/2008, ascus 06/2009,10/2009  . HSV-2 infection   . Hypercholesteremia   . Hypertension   . OSA on CPAP   . Pes planus of both feet 06/20/2013  . Prediabetes   . Sleep apnea      Allergies  Allergen Reactions  . Doxycycline Hives and Shortness Of Breath  . Ace Inhibitors     cough  . Adhesive [Tape]   . Augmentin [Amoxicillin-Pot Clavulanate] Nausea Only    diarrhea  . Clindamycin/Lincomycin   . Epinephrine-Lidocaine-Na Metabisulfite [Lidocaine-Epinephrine]     Has heart palpitations from the epi, wants without  . Levaquin [Levofloxacin] Other (See Comments)    Dysphoria  .  Vicodin [Hydrocodone-Acetaminophen] Itching    Current Outpatient Medications on File Prior to Visit  Medication Sig  . ALPRAZolam (XANAX) 0.5 MG tablet TAKE ONE-HALF TO ONE TABLET BY MOUTH TWO TO THREE TIMES DAILY AS NEEDED FOR ANXIETY OR PANIC ATTACKS  . amitriptyline (ELAVIL) 50 MG tablet Take 1 to 2 tablets at Bedtime  if needed for Sleep  . buPROPion (WELLBUTRIN XL) 150 MG 24 hr tablet TAKE ONE TABLET BY MOUTH EVERY MORNING  . Cyanocobalamin 1000 MCG/15ML LIQD Take by mouth daily.  Marland Kitchen escitalopram (LEXAPRO) 20 MG tablet Take 1 tablet Daily for Mood  . etonogestrel (NEXPLANON) 68 MG IMPL implant 1 each by Subdermal route once.  . furosemide (LASIX) 40 MG tablet Take 1 tablet (40 mg total) by mouth daily.  Marland Kitchen MAGNESIUM PO Take by mouth as needed. Reported on 06/27/2015  . promethazine (PHENERGAN) 25 MG tablet TAKE 1 TABLET (25 MG TOTAL) BY MOUTH EVERY 6 (SIX) HOURS AS NEEDED FOR NAUSEA OR VOMITING. MAX: 4 TABLETS PER DAY  . rosuvastatin (CRESTOR) 10 MG tablet Take 1 tablet Daily for Cholesterol  . valACYclovir (VALTREX) 500 MG tablet Take 1 tablet (500 mg total) by mouth 2 (two) times daily. (Patient taking differently: Take 500 mg by mouth as needed. )  . Vitamin D, Ergocalciferol, (DRISDOL) 1.25 MG (50000 UT) CAPS capsule Take 1 capsule 3 x  /week for severe Vitamin D  Deficiency (Patient taking differently: Take 1 capsule 1 x  /week for severe Vitamin D  Deficiency)  . cephALEXin (KEFLEX) 500 MG capsule Take 1 capsule 4 x/day after meals & bedtime for infection (Patient not taking: Reported on 10/03/2018)  . Diclofenac Sodium 2 % SOLN Place 2 g onto the skin 2 (two) times daily.  . fluconazole (DIFLUCAN) 150 MG tablet Take 1 tablet (150 mg total) by mouth daily. (Patient not taking: Reported on 03/09/2019)  . IRON PO Take by mouth daily. Reported on 05/28/2015  . linaclotide (LINZESS) 145 MCG CAPS capsule Take 1 capsule every morning for Constipation (Patient not taking: Reported on 03/09/2019)   No current facility-administered medications on file prior to visit.    ROS: all negative except above.   Physical Exam:  BP (!) 150/100   Pulse 73   Temp (!) 97.3 F (36.3 C)   Wt 265 lb (120.2 kg)   SpO2 98%   BMI 44.10 kg/m   General Appearance: Well nourished, in no apparent distress. Guarded positioning of  left extremity.  Eyes: conjunctiva no swelling or erythema ENT/Mouth: Mask in place; Hearing normal. No cough for duration of exam.  Neck: Supple, thyroid normal.  Respiratory: Respiratory effort normal, BS equal bilaterally without rales, rhonchi, wheezing or stridor.  Cardio: RRR with no MRGs. Brisk symmetrical radial pulses without edema.  Abdomen: Soft, + BS.  Non tender. Lymphatics: Non tender without lymphadenopathy.  Musculoskeletal: No obvious deformity, normal gait. Shoulder right: Full range of motion. Left: pain with external rotation, internal horizontal rotation, lateral abduction past 90 degrees; Neurovascularly intact distally. Good strength with stress of rotator cuff but causes pain. Positive impingement signs - Hawkin's, Neers. No crepitus or effusion at shoulder joint; some tenderness lateral to acromial process. Full ROM of neck, no paraspinal tenderness, no spinous tenderness; negative Spurling's.  Skin: Warm, dry without rashes, lesions, ecchymosis.  Neuro: Normal muscle tone, Sensation intact.  Psych: Awake and oriented X 3, normal affect, Insight and Judgment appropriate.     Izora Ribas, NP 12:14 PM [facility]

## 2019-03-09 NOTE — Patient Instructions (Addendum)
Shoulder Exercises Ask your health care provider which exercises are safe for you. Do exercises exactly as told by your health care provider and adjust them as directed. It is normal to feel mild stretching, pulling, tightness, or discomfort as you do these exercises. Stop right away if you feel sudden pain or your pain gets worse. Do not begin these exercises until told by your health care provider. Stretching exercises External rotation and abduction This exercise is sometimes called corner stretch. This exercise rotates your arm outward (external rotation) and moves your arm out from your body (abduction). 1. Stand in a doorway with one of your feet slightly in front of the other. This is called a staggered stance. If you cannot reach your forearms to the door frame, stand facing a corner of a room. 2. Choose one of the following positions as told by your health care provider: ? Place your hands and forearms on the door frame above your head. ? Place your hands and forearms on the door frame at the height of your head. ? Place your hands on the door frame at the height of your elbows. 3. Slowly move your weight onto your front foot until you feel a stretch across your chest and in the front of your shoulders. Keep your head and chest upright and keep your abdominal muscles tight. 4. Hold for __________ seconds. 5. To release the stretch, shift your weight to your back foot. Repeat __________ times. Complete this exercise __________ times a day. Extension, standing 1. Stand and hold a broomstick, a cane, or a similar object behind your back. ? Your hands should be a little wider than shoulder width apart. ? Your palms should face away from your back. 2. Keeping your elbows straight and your shoulder muscles relaxed, move the stick away from your body until you feel a stretch in your shoulders (extension). ? Avoid shrugging your shoulders while you move the stick. Keep your shoulder  blades tucked down toward the middle of your back. 3. Hold for __________ seconds. 4. Slowly return to the starting position. Repeat __________ times. Complete this exercise __________ times a day. Range-of-motion exercises Pendulum  1. Stand near a wall or a surface that you can hold onto for balance. 2. Bend at the waist and let your left / right arm hang straight down. Use your other arm to support you. Keep your back straight and do not lock your knees. 3. Relax your left / right arm and shoulder muscles, and move your hips and your trunk so your left / right arm swings freely. Your arm should swing because of the motion of your body, not because you are using your arm or shoulder muscles. 4. Keep moving your hips and trunk so your arm swings in the following directions, as told by your health care provider: ? Side to side. ? Forward and backward. ? In clockwise and counterclockwise circles. 5. Continue each motion for __________ seconds, or for as long as told by your health care provider. 6. Slowly return to the starting position. Repeat __________ times. Complete this exercise __________ times a day. Shoulder flexion, standing  1. Stand and hold a broomstick, a cane, or a similar object. Place your hands a little more than shoulder width apart on the object. Your left / right hand should be palm up, and your other hand should be palm down. 2. Keep your elbow straight and your shoulder muscles relaxed. Push the stick up with your  healthy arm to raise your left / right arm in front of your body, and then over your head until you feel a stretch in your shoulder (flexion). ? Avoid shrugging your shoulder while you raise your arm. Keep your shoulder blade tucked down toward the middle of your back. 3. Hold for __________ seconds. 4. Slowly return to the starting position. Repeat __________ times. Complete this exercise __________ times a day. Shoulder abduction, standing 1. Stand and hold  a broomstick, a cane, or a similar object. Place your hands a little more than shoulder width apart on the object. Your left / right hand should be palm up, and your other hand should be palm down. 2. Keep your elbow straight and your shoulder muscles relaxed. Push the object across your body toward your left / right side. Raise your left / right arm to the side of your body (abduction) until you feel a stretch in your shoulder. ? Do not raise your arm above shoulder height unless your health care provider tells you to do that. ? If directed, raise your arm over your head. ? Avoid shrugging your shoulder while you raise your arm. Keep your shoulder blade tucked down toward the middle of your back. 3. Hold for __________ seconds. 4. Slowly return to the starting position. Repeat __________ times. Complete this exercise __________ times a day. Internal rotation  1. Place your left / right hand behind your back, palm up. 2. Use your other hand to dangle an exercise band, a towel, or a similar object over your shoulder. Grasp the band with your left / right hand so you are holding on to both ends. 3. Gently pull up on the band until you feel a stretch in the front of your left / right shoulder. The movement of your arm toward the center of your body is called internal rotation. ? Avoid shrugging your shoulder while you raise your arm. Keep your shoulder blade tucked down toward the middle of your back. 4. Hold for __________ seconds. 5. Release the stretch by letting go of the band and lowering your hands. Repeat __________ times. Complete this exercise __________ times a day. Strengthening exercises External rotation  1. Sit in a stable chair without armrests. 2. Secure an exercise band to a stable object at elbow height on your left / right side. 3. Place a soft object, such as a folded towel or a small pillow, between your left / right upper arm and your body to move your elbow about 4 inches  (10 cm) away from your side. 4. Hold the end of the exercise band so it is tight and there is no slack. 5. Keeping your elbow pressed against the soft object, slowly move your forearm out, away from your abdomen (external rotation). Keep your body steady so only your forearm moves. 6. Hold for __________ seconds. 7. Slowly return to the starting position. Repeat __________ times. Complete this exercise __________ times a day. Shoulder abduction  1. Sit in a stable chair without armrests, or stand up. 2. Hold a __________ weight in your left / right hand, or hold an exercise band with both hands. 3. Start with your arms straight down and your left / right palm facing in, toward your body. 4. Slowly lift your left / right hand out to your side (abduction). Do not lift your hand above shoulder height unless your health care provider tells you that this is safe. ? Keep your arms straight. ? Avoid shrugging your  shoulder while you do this movement. Keep your shoulder blade tucked down toward the middle of your back. 5. Hold for __________ seconds. 6. Slowly lower your arm, and return to the starting position. Repeat __________ times. Complete this exercise __________ times a day. Shoulder extension 1. Sit in a stable chair without armrests, or stand up. 2. Secure an exercise band to a stable object in front of you so it is at shoulder height. 3. Hold one end of the exercise band in each hand. Your palms should face each other. 4. Straighten your elbows and lift your hands up to shoulder height. 5. Step back, away from the secured end of the exercise band, until the band is tight and there is no slack. 6. Squeeze your shoulder blades together as you pull your hands down to the sides of your thighs (extension). Stop when your hands are straight down by your sides. Do not let your hands go behind your body. 7. Hold for __________ seconds. 8. Slowly return to the starting position. Repeat __________  times. Complete this exercise __________ times a day. Shoulder row 1. Sit in a stable chair without armrests, or stand up. 2. Secure an exercise band to a stable object in front of you so it is at waist height. 3. Hold one end of the exercise band in each hand. Position your palms so that your thumbs are facing the ceiling (neutral position). 4. Bend each of your elbows to a 90-degree angle (right angle) and keep your upper arms at your sides. 5. Step back until the band is tight and there is no slack. 6. Slowly pull your elbows back behind you. 7. Hold for __________ seconds. 8. Slowly return to the starting position. Repeat __________ times. Complete this exercise __________ times a day. Shoulder press-ups  1. Sit in a stable chair that has armrests. Sit upright, with your feet flat on the floor. 2. Put your hands on the armrests so your elbows are bent and your fingers are pointing forward. Your hands should be about even with the sides of your body. 3. Push down on the armrests and use your arms to lift yourself off the chair. Straighten your elbows and lift yourself up as much as you comfortably can. ? Move your shoulder blades down, and avoid letting your shoulders move up toward your ears. ? Keep your feet on the ground. As you get stronger, your feet should support less of your body weight as you lift yourself up. 4. Hold for __________ seconds. 5. Slowly lower yourself back into the chair. Repeat __________ times. Complete this exercise __________ times a day. Wall push-ups  1. Stand so you are facing a stable wall. Your feet should be about one arm-length away from the wall. 2. Lean forward and place your palms on the wall at shoulder height. 3. Keep your feet flat on the floor as you bend your elbows and lean forward toward the wall. 4. Hold for __________ seconds. 5. Straighten your elbows to push yourself back to the starting position. Repeat __________ times. Complete this  exercise __________ times a day. This information is not intended to replace advice given to you by your health care provider. Make sure you discuss any questions you have with your health care provider. Document Revised: 06/03/2018 Document Reviewed: 03/11/2018 Elsevier Patient Education  2020 [place].       Shoulder Impingement Syndrome  Shoulder impingement syndrome is a condition that causes pain when connective tissues (tendons) surrounding  the shoulder joint become pinched. These tendons are part of the group of muscles and tissues that help to stabilize the shoulder (rotator cuff). Beneath the rotator cuff is a fluid-filled sac (bursa) that allows the muscles and tendons to glide smoothly. The bursa may become swollen or irritated (bursitis). Bursitis, swelling in the rotator cuff tendons, or both conditions can decrease how much space is under a bone in the shoulder joint (acromion), resulting in impingement. What are the causes? Shoulder impingement syndrome may be caused by bursitis or swelling of the rotator cuff tendons, which may result from:  Repetitive overhead arm movements.  Falling onto the shoulder.  Weakness in the shoulder muscles. What increases the risk? You may be more likely to develop this condition if you:  Play sports that involve throwing, such as baseball.  Participate in sports such as tennis, volleyball, and swimming.  Work as a Curator, Games developer, or Architect. Some people are also more likely to develop impingement syndrome because of the shape of their acromion bone. What are the signs or symptoms? The main symptom of this condition is pain on the front or side of the shoulder. The pain may:  Get worse when lifting or raising the arm.  Get worse at night.  Wake you up from sleeping.  Feel sharp when the shoulder is moved and then fade to an ache. Other symptoms may include:  Tenderness.  Stiffness.  Inability to raise the arm  above shoulder level or behind the body.  Weakness. How is this diagnosed? This condition may be diagnosed based on:  Your symptoms and medical history.  A physical exam.  Imaging tests, such as: ? X-rays. ? MRI. ? Ultrasound. How is this treated? This condition may be treated by:  Resting your shoulder and avoiding all activities that cause pain or put stress on the shoulder.  Icing your shoulder.  NSAIDs to help reduce pain and swelling.  One or more injections of medicines to numb the area and reduce inflammation.  Physical therapy.  Surgery. This may be needed if nonsurgical treatments have not helped. Surgery may involve repairing the rotator cuff, reshaping the acromion, or removing the bursa. Follow these instructions at home: Managing pain, stiffness, and swelling   If directed, put ice on the injured area. ? Put ice in a plastic bag. ? Place a towel between your skin and the bag. ? Leave the ice on for 20 minutes, 2-3 times a day. Activity  Rest and return to your normal activities as told by your health care provider. Ask your health care provider what activities are safe for you.  Do exercises as told by your health care provider. General instructions  Do not use any products that contain nicotine or tobacco, such as cigarettes, e-cigarettes, and chewing tobacco. These can delay healing. If you need help quitting, ask your health care provider.  Ask your health care provider when it is safe for you to drive.  Take over-the-counter and prescription medicines only as told by your health care provider.  Keep all follow-up visits as told by your health care provider. This is important. How is this prevented?  Give your body time to rest between periods of activity.  Be safe and responsible while being active. This will help you avoid falls.  Maintain physical fitness, including strength and flexibility. Contact a health care provider if:  Your  symptoms have not improved after 1-2 months of treatment and rest.  You cannot lift your arm  away from your body. Summary  Shoulder impingement syndrome is a condition that causes pain when connective tissues (tendons) surrounding the shoulder joint become pinched.  The main symptom of this condition is pain on the front or side of the shoulder.  This condition is usually treated with rest, ice, and pain medicines as needed. This information is not intended to replace advice given to you by your health care provider. Make sure you discuss any questions you have with your health care provider. Document Revised: 06/03/2018 Document Reviewed: 08/04/2017 Elsevier Patient Education  2020 [employer].

## 2019-03-23 ENCOUNTER — Other Ambulatory Visit: Payer: Self-pay | Admitting: Internal Medicine

## 2019-03-23 DIAGNOSIS — F411 Generalized anxiety disorder: Secondary | ICD-10-CM

## 2019-04-05 NOTE — Progress Notes (Signed)
COMPLETE PHYSICAL  Assessment and Plan:   Essential hypertension - continue medications, DASH diet, exercise and monitor at home. Call if greater than 130/80.  -     CBC with Differential/Platelet -     BASIC METABOLIC PANEL WITH GFR -     Hepatic function panel -     TSH -     Urinalysis, Routine w reflex microscopic -     Microalbumin / creatinine urine ratio -     EKG 12-Lead  OSA (obstructive sleep apnea) Continue CPAP  Hypercholesteremia -continue medications, check lipids, decrease fatty foods, increase activity.  -     Lipid panel  Class 2 obesity due to excess calories without serious comorbidity with body mass index (BMI) of 39.0 to 39.9 in adult - long discussion about weight loss, diet, and exercise  Medication management -     Magnesium  Vitamin D deficiency -     VITAMIN D 25 Hydroxy (Vit-D Deficiency, Fractures)  Insulin resistance Continue weight loss  Anemia, unspecified type - monitor, continue iron supp with Vitamin C and increase green leafy veggies  HSV-2 infection Continue meds  Insomnia -     TOPAMAX  Anxiety Continue medications  Depression Continue medications  Encounter for general adult medical examination with abnormal findings -     CBC with Differential/Platelet -     COMPLETE METABOLIC PANEL WITH GFR -     TSH -     Lipid panel -     Hemoglobin A1c -     Magnesium -     VITAMIN D 25 Hydroxy (Vit-D Deficiency, Fractures) -     Urinalysis, Routine w reflex microscopic -     Microalbumin / creatinine urine ratio -     Iron,Total/Total Iron Binding Cap -     Vitamin B12 -     EKG 12-Lead -     Sedimentation rate -     topiramate (TOPAMAX) 50 MG tablet; Take 1 tablet (50 mg total) by mouth 2 (two) times daily.  Chronic midline low back pain without sciatica -     topiramate (TOPAMAX) 50 MG tablet; Take 1 tablet (50 mg total) by mouth 2 (two) times daily. - ADD TUMERIC IF NOT BETTER SUGGEST ORTHO/NEUROSURGERY REFERRAL.    Arthralgia, unspecified joint -     Sedimentation rate - WITH HIP AND SHOULD PAIN WILL GET ESR   Continue diet and meds as discussed. Further disposition pending results of labs. Over 40 minutes of exam, counseling, chart review, and critical decision making was performed  Future Appointments  Date Time Provider [place]  04/05/2020 10:30 AM Vicie Mutters, PA-C [facility] None     HPI 54 y.o. female  presents for CPE and 3 month follow up on hypertension, cholesterol, prediabetes, obesity, and vitamin D deficiency.   Her blood pressure has been controlled at home , today their BP is BP: 128/88   She is not drinking around her mom since Jan 20th, states she has not been sleeping well since then and has lower back pain. Lower back pain is 8 out of 10.    She did not tolerate amitriptyline, had red eye and pressure- has stopped. She is not on xanax anymore, her anxiety has improved but she is not sleeping until 4 AM. She has decreased her lexapro to 10 mg. .   She has right shoulder pain, very specific anterior shooting shoulder pain. She follows with [facility], Dr. Tamala Julian. States feels better after good  neck massage. She has not taken the mobic, has it at home. She has tried for her back and right shoulder, advil, tylenol arthritis and 1/2 an expired percocet. She will have lower back pain, pain in bilateral glutes, worse at night with lying on that side. No pain down her legs. Patient denies fever, hematuria, incontinence, numbness, tingling, weakness and saddle anesthesia Normal AB [place] 08/2018 DG lumbar showed L5-S1 disc space narrowing, mild L4-5 facet degenerative charnge with small bowel loop thickened folds.   She will use linzess AS needed for constipation and that helps.    She does not workout. She denies chest pain, dizziness, SOB.  She is on cholesterol medication, crestor 59m 2-3 X A WEEK and denies myalgias. Her cholesterol is at goal. The cholesterol last  visit was:   Lab Results  Component Value Date   CHOL 182 08/17/2018   HDL 50 08/17/2018   LDLCALC 108 (H) 08/17/2018   TRIG 125 08/17/2018   CHOLHDL 3.6 08/17/2018    She has been working on diet and exercise for prediabetes with insulin resistance due to obesity, and denies paresthesia of the feet, polydipsia, polyuria and visual disturbances. Last A1C in the office was:  Lab Results  Component Value Date   HGBA1C 5.4 08/17/2018   Patient is on Vitamin D supplement.   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 FPhineas RealBMI is Body mass index is 43.6 kg/m., she is working on diet and exercise. She has OSA and is on CPAP.  Wt Readings from Last 3 Encounters:  04/06/19 262 lb (118.8 kg)  03/09/19 265 lb (120.2 kg)  10/03/18 263 lb 6.4 oz (119.5 kg)    Current Medications:   Current Outpatient Medications (Endocrine & Metabolic):  .  etonogestrel (NEXPLANON) 68 MG IMPL implant, 1 each by Subdermal route once.  Current Outpatient Medications (Cardiovascular):  .  furosemide (LASIX) 40 MG tablet, Take 1 tablet (40 mg total) by mouth daily. .  rosuvastatin (CRESTOR) 10 MG tablet, Take 1 tablet Daily for Cholesterol   Current Outpatient Medications (Analgesics):  .  meloxicam (MOBIC) 15 MG tablet, Take one daily with food for 2 weeks, can take with tylenol, can not take with aleve, iburpofen, then as needed daily for pain  Current Outpatient Medications (Hematological):  .Marland Kitchen Cyanocobalamin 1000 MCG/15ML LIQD, Take by mouth daily. .  IRON PO, Take by mouth daily. Reported on 05/28/2015  Current Outpatient Medications (Other):  .Marland Kitchen ALPRAZolam (XANAX) 0.5 MG tablet, TAKE ONE-HALF TO ONE TABLET BY MOUTH TWO TO THREE TIMES DAILY AS NEEDED FOR ANXIETY OR PANIC ATTACKS .  escitalopram (LEXAPRO) 20 MG tablet, Take 1 tablet Daily for Mood .  MAGNESIUM PO, Take  by mouth as needed. Reported on 06/27/2015 .  valACYclovir (VALTREX) 500 MG tablet, Take 1 to  2 tablets Daily Prophylaxis for Fever Blisters  / Cold Sores .  Vitamin D, Ergocalciferol, (DRISDOL) 1.25 MG (50000 UT) CAPS capsule, Take 1 capsule 3 x  /week for severe Vitamin D  Deficiency (Patient taking differently: Take 1 capsule 1 x  /week for severe Vitamin D  Deficiency) .  fluconazole (DIFLUCAN) 150 MG tablet, Take 1 tablet (150 mg total) by mouth daily. .Marland Kitchen topiramate (TOPAMAX) 50 MG tablet, Take 1 tablet (50 mg total) by mouth 2 (two) times daily.  Medical History:  Past Medical History:  Diagnosis Date  . Allergy   . Anemia   . Anxiety   . ASCUS (atypical squamous cells of undetermined significance) on Pap smear    neg HR HPV 09/2008, ascus 06/2009,10/2009  . HSV-2 infection   . Hypercholesteremia   . Hypertension   . OSA on CPAP   . Pes planus of both feet 06/20/2013  . Prediabetes   . Sleep apnea    Health Maintenance Immunization History  Administered Date(s) Administered  . Influenza Inj Mdck Quad With Preservative 12/01/2017  . Influenza Split 02/02/2011  . Influenza,inj,Quad PF,6+ Mos 11/21/2012, 11/10/2013, 11/14/2014  . Td 06/23/2005  . Tdap 07/09/2015    Tetanus: 2017 Pneumovax: N/A Prevnar 13: N/A Flu vaccine: 2020 today Zostavax: N/A  Pap: 09/2016 normal, Dr. Marvel Plan MGM: 01/2018 DEXA: 08/2014 Colonoscopy: 06/2016 Normal Dr. Havery Moros due 5 years Hpylori breath test 2016 negative EGD:N/A Echo 2007 PFT 06/2015 Last Eye Exam: Dr. Clydene Laming Dentist: Dr. Jake Seats Orthodontist- got braces Dr. Ulyess Blossom  Patient Care Team: Unk Pinto, MD as PCP - General (Internal Medicine) Berenice Primas, MD as Referring Physician (Orthopedic Surgery) Lelon Perla, MD as Consulting Physician (Cardiology) Phineas Real, Belinda Block, MD as Consulting Physician (Gynecology) Rolm Bookbinder, MD as Consulting Physician (Dermatology)   Allergies:  Allergies  Allergen  Reactions  . Doxycycline Hives and Shortness Of Breath  . Ace Inhibitors     cough  . Adhesive [Tape]   . Augmentin [Amoxicillin-Pot Clavulanate] Nausea Only    diarrhea  . Clindamycin/Lincomycin   . Epinephrine-Lidocaine-Na Metabisulfite [Lidocaine-Epinephrine]     Has heart palpitations from the epi, wants without  . Levaquin [Levofloxacin] Other (See Comments)    Dysphoria  . Vicodin [Hydrocodone-Acetaminophen] Itching    SURGICAL HISTORY She  has a past surgical history that includes Cesarean section; remove moles; Colposcopy; Knee surgery; Mouth surgery; Nexplanon insertion (05/22/2015); and Colonoscopy (2005?). FAMILY HISTORY Her family history includes Breast cancer (age of onset: 23) in her maternal grandmother; Cancer in her mother; Emphysema in her maternal grandmother; Hyperlipidemia in her mother; Hypertension in her maternal grandmother and mother. SOCIAL HISTORY She  reports that she quit smoking about 19 years ago. Her smoking use included cigarettes. She has a 3.00 pack-year smoking history. She has never used smokeless tobacco. She reports current alcohol use of about 11.0 - 15.0 standard drinks of alcohol per week. She reports that she does not use drugs.  Review of Systems:  Review of Systems  Constitutional: Positive for malaise/fatigue. Negative for chills, diaphoresis, fever and weight loss.  HENT: Negative.   Eyes: Negative.   Respiratory: Negative for cough, hemoptysis, sputum production, shortness of breath and wheezing.   Cardiovascular: Negative.  Negative for chest pain, palpitations, orthopnea, claudication, leg swelling and PND.  Gastrointestinal: Positive for constipation.  Genitourinary: Negative.   Musculoskeletal: Positive for back pain, joint pain and neck pain. Negative for falls and myalgias.  Skin: Negative.   Neurological: Negative.  Negative for weakness.  Psychiatric/Behavioral: Negative for depression, hallucinations, memory loss, substance  abuse and suicidal ideas. The patient has insomnia. The patient is not nervous/anxious.    Physical Exam: BP 128/88   Pulse 86   Temp 97.6 F (36.4 C)   Ht '5\' 5"'  (1.651 m)   Wt 262 lb (118.8 kg)   SpO2 99%   BMI 43.60 kg/m  Wt Readings from Last 3 Encounters:  04/06/19 262 lb (118.8 kg)  03/09/19 265 lb (120.2 kg)  10/03/18 263 lb 6.4  oz (119.5 kg)   General Appearance: Well nourished, in no apparent distress. Eyes: PERRLA, EOMs, conjunctiva no swelling or erythema Sinuses: No Frontal/maxillary tenderness ENT/Mouth: Ext aud canals clear, TMs without erythema, bulging. Mouth and nose not examined- patient wearing a facemask. Hearing normal.  Neck: Supple, thyroid normal.  Respiratory: Respiratory effort normal, BS equal bilaterally without rales, rhonchi, wheezing or stridor.  Cardio: RRR with no MRGs. Brisk peripheral pulses without edema.  Abdomen: Soft, + BS,  + epigastric tenderness, no guarding, rebound, hernias, masses. Lymphatics: Non tender without lymphadenopathy.  Musculoskeletal: Full ROM, 5/5 strength, Normal gait, Patient is able to ambulate well. Gait is not  Antalgic. Straight leg raising with dorsiflexion negative bilaterally for radicular symptoms. Sensory exam in the legs are normal. Knee reflexes are UNABLE TO GET BILATERALLY Ankle reflexes are normal Strength is normal and symmetric in arms and legs. There is SI tenderness on the left to palpation.  There is paraspinal muscle spasm.  There is midline tenderness.  ROM of spine with  limited in all spheres due to pain.  Skin: Warm, dry without rashes, lesions, ecchymosis. + left hip greater trocanteric tenderness Neuro: Cranial nerves intact. Normal muscle tone, no cerebellar symptoms. Psych: Awake and oriented X 3, normal affect, Insight and Judgment appropriate.    Vicie Mutters, PA-C 10:57 AM [facility]

## 2019-04-06 ENCOUNTER — Other Ambulatory Visit: Payer: Self-pay

## 2019-04-06 ENCOUNTER — Ambulatory Visit (INDEPENDENT_AMBULATORY_CARE_PROVIDER_SITE_OTHER): Payer: 59 | Admitting: Physician Assistant

## 2019-04-06 ENCOUNTER — Encounter: Payer: Self-pay | Admitting: Physician Assistant

## 2019-04-06 VITALS — BP 128/88 | HR 86 | Temp 97.6°F | Ht 65.0 in | Wt 262.0 lb

## 2019-04-06 DIAGNOSIS — Z13220 Encounter for screening for lipoid disorders: Secondary | ICD-10-CM | POA: Diagnosis not present

## 2019-04-06 DIAGNOSIS — G4733 Obstructive sleep apnea (adult) (pediatric): Secondary | ICD-10-CM

## 2019-04-06 DIAGNOSIS — Z130 Encounter for screening for diseases of the blood and blood-forming organs and certain disorders involving the immune mechanism: Secondary | ICD-10-CM

## 2019-04-06 DIAGNOSIS — Z136 Encounter for screening for cardiovascular disorders: Secondary | ICD-10-CM

## 2019-04-06 DIAGNOSIS — I10 Essential (primary) hypertension: Secondary | ICD-10-CM

## 2019-04-06 DIAGNOSIS — Z0001 Encounter for general adult medical examination with abnormal findings: Secondary | ICD-10-CM

## 2019-04-06 DIAGNOSIS — Z79899 Other long term (current) drug therapy: Secondary | ICD-10-CM

## 2019-04-06 DIAGNOSIS — E559 Vitamin D deficiency, unspecified: Secondary | ICD-10-CM

## 2019-04-06 DIAGNOSIS — Z1389 Encounter for screening for other disorder: Secondary | ICD-10-CM | POA: Diagnosis not present

## 2019-04-06 DIAGNOSIS — Z1329 Encounter for screening for other suspected endocrine disorder: Secondary | ICD-10-CM | POA: Diagnosis not present

## 2019-04-06 DIAGNOSIS — D649 Anemia, unspecified: Secondary | ICD-10-CM

## 2019-04-06 DIAGNOSIS — E7800 Pure hypercholesterolemia, unspecified: Secondary | ICD-10-CM

## 2019-04-06 DIAGNOSIS — Z131 Encounter for screening for diabetes mellitus: Secondary | ICD-10-CM | POA: Diagnosis not present

## 2019-04-06 DIAGNOSIS — E8881 Metabolic syndrome: Secondary | ICD-10-CM

## 2019-04-06 DIAGNOSIS — E6609 Other obesity due to excess calories: Secondary | ICD-10-CM

## 2019-04-06 DIAGNOSIS — G8929 Other chronic pain: Secondary | ICD-10-CM

## 2019-04-06 DIAGNOSIS — M2550 Pain in unspecified joint: Secondary | ICD-10-CM

## 2019-04-06 DIAGNOSIS — B009 Herpesviral infection, unspecified: Secondary | ICD-10-CM

## 2019-04-06 DIAGNOSIS — Z0000 Encounter for general adult medical examination without abnormal findings: Secondary | ICD-10-CM | POA: Diagnosis not present

## 2019-04-06 DIAGNOSIS — F3341 Major depressive disorder, recurrent, in partial remission: Secondary | ICD-10-CM

## 2019-04-06 MED ORDER — TOPIRAMATE 50 MG PO TABS: 50.0000 mg | ORAL_TABLET | Freq: Two times a day (BID) | ORAL | 2 refills | Status: DC

## 2019-04-06 MED ORDER — FLUCONAZOLE 150 MG PO TABS: 150.0000 mg | ORAL_TABLET | Freq: Every day | ORAL | 3 refills | Status: DC

## 2019-04-06 NOTE — Patient Instructions (Addendum)
TUMERIC FOR INFLAMMATION  Tumeric with black pepper extract is a great natural antiinflammatory that helps with arthritis and aches and pain. Can get from [employer] or any health food store. Need to take at least 800mg  twice a day with food TO [place].   TOPAMAX Going to start you on topamax, start on 1/2 pill for 3-5 nights, can increase to a whole pill for 1-2 weeks.  Can increase to 1 pill at night and 1/2 pill in the AM.  This medication is good for weight loss, headaches, pain This medication can cause numbness, tingling and can cause brain fog- stop if you get these If you get blurry vision or have a history of glaucoma please stop this medication.  Let me know how you are doing with this.   Topiramate tablets What is this medicine? TOPIRAMATE (toe PYRE a mate) is used to treat seizures in adults or children with epilepsy. It is also used for the prevention of migraine headaches. This medicine may be used for other purposes; ask your health care provider or pharmacist if you have questions. COMMON BRAND NAME(S): Topamax, Topiragen What should I tell my health care provider before I take this medicine? They need to know if you have any of these conditions: -bleeding disorders -cirrhosis of the liver or liver disease -diarrhea -glaucoma -kidney stones or kidney disease -low blood counts, like low white cell, platelet, or red cell counts -lung disease like asthma, obstructive pulmonary disease, emphysema -metabolic acidosis -on a ketogenic diet -schedule for surgery or a procedure -suicidal thoughts, plans, or attempt; a previous suicide attempt by you or a family member -an unusual or allergic reaction to topiramate, other medicines, foods, dyes, or preservatives -pregnant or trying to get pregnant -breast-feeding How should I use this medicine? Take this medicine by mouth with a glass of water. Follow the directions on the prescription label. Do not crush or chew. You may  take this medicine with meals. Take your medicine at regular intervals. Do not take it more often than directed. Talk to your pediatrician regarding the use of this medicine in children. Special care may be needed. While this drug may be prescribed for children as young as 10 years of age for selected conditions, precautions do apply. Overdosage: If you think you have taken too much of this medicine contact a poison control center or emergency room at once. NOTE: This medicine is only for you. Do not share this medicine with others. What if I miss a dose? If you miss a dose, take it as soon as you can. If your next dose is to be taken in less than 6 hours, then do not take the missed dose. Take the next dose at your regular time. Do not take double or extra doses. What may interact with this medicine? Do not take this medicine with any of the following medications: -probenecid This medicine may also interact with the following medications: -acetazolamide -alcohol -amitriptyline -aspirin and aspirin-like medicines -birth control pills -certain medicines for depression -certain medicines for seizures -certain medicines that treat or prevent blood clots like warfarin, enoxaparin, dalteparin, apixaban, dabigatran, and rivaroxaban -digoxin -hydrochlorothiazide -lithium -medicines for pain, sleep, or muscle relaxation -metformin -methazolamide -NSAIDS, medicines for pain and inflammation, like ibuprofen or naproxen -pioglitazone -risperidone This list may not describe all possible interactions. Give your health care provider a list of all the medicines, herbs, non-prescription drugs, or dietary supplements you use. Also tell them if you smoke, drink alcohol, or use  illegal drugs. Some items may interact with your medicine. What should I watch for while using this medicine? Visit your doctor or health care professional for regular checks on your progress. Do not stop taking this medicine  suddenly. This increases the risk of seizures if you are using this medicine to control epilepsy. Wear a medical identification bracelet or chain to say you have epilepsy or seizures, and carry a card that lists all your medicines. This medicine can decrease sweating and increase your body temperature. Watch for signs of deceased sweating or fever, especially in children. Avoid extreme heat, hot baths, and saunas. Be careful about exercising, especially in hot weather. Contact your health care provider right away if you notice a fever or decrease in sweating. You should drink plenty of fluids while taking this medicine. If you have had kidney stones in the past, this will help to reduce your chances of forming kidney stones. If you have stomach pain, with nausea or vomiting and yellowing of your eyes or skin, call your doctor immediately. You may get drowsy, dizzy, or have blurred vision. Do not drive, use machinery, or do anything that needs mental alertness until you know how this medicine affects you. To reduce dizziness, do not sit or stand up quickly, especially if you are an older patient. Alcohol can increase drowsiness and dizziness. Avoid alcoholic drinks. If you notice blurred vision, eye pain, or other eye problems, seek medical attention at once for an eye exam. The use of this medicine may increase the chance of suicidal thoughts or actions. Pay special attention to how you are responding while on this medicine. Any worsening of mood, or thoughts of suicide or dying should be reported to your health care professional right away. This medicine may increase the chance of developing metabolic acidosis. If left untreated, this can cause kidney stones, bone disease, or slowed growth in children. Symptoms include breathing fast, fatigue, loss of appetite, irregular heartbeat, or loss of consciousness. Call your doctor immediately if you experience any of these side effects. Also, tell your doctor about  any surgery you plan on having while taking this medicine since this may increase your risk for metabolic acidosis. Birth control pills may not work properly while you are taking this medicine. Talk to your doctor about using an extra method of birth control. Women who become pregnant while using this medicine may enroll in the [place] Pregnancy Registry by calling [phone]. This registry collects information about the safety of antiepileptic drug use during pregnancy. What side effects may I notice from receiving this medicine? Side effects that you should report to your doctor or health care professional as soon as possible: -allergic reactions like skin rash, itching or hives, swelling of the face, lips, or tongue -decreased sweating and/or rise in body temperature -depression -difficulty breathing, fast or irregular breathing patterns -difficulty speaking -difficulty walking or controlling muscle movements -hearing impairment -redness, blistering, peeling or loosening of the skin, including inside the mouth -tingling, pain or numbness in the hands or feet -unusual bleeding or bruising -unusually weak or tired -worsening of mood, thoughts or actions of suicide or dying Side effects that usually do not require medical attention (report to your doctor or health care professional if they continue or are bothersome): -altered taste -back pain, joint or muscle aches and pains -diarrhea, or constipation -headache -loss of appetite -nausea -stomach upset, indigestion -tremors This list may not describe all possible side effects. Call your doctor for medical  advice about side effects. You may report side effects to FDA at 1-800-FDA-1088. Where should I keep my medicine? Keep out of the reach of children. Store at room temperature between 15 and 30 degrees C (59 and 86 degrees F) in a tightly closed container. Protect from moisture. Throw away any unused  medicine after the expiration date. NOTE: This sheet is a summary. It may not cover all possible information. If you have questions about this medicine, talk to your doctor, pharmacist, or health care provider.  2018 Elsevier/Gold Standard (2013-02-13 23:17:57)  Silent reflux: Not all heartburn burns...Marland KitchenMarland KitchenMarland Kitchen  What is LPR? Laryngopharyngeal reflux (LPR) or silent reflux is a condition in which acid that is made in the stomach travels up the esophagus (swallowing tube) and gets to the throat. Not everyone with reflux has a lot of heartburn or indigestion. In fact, many people with LPR never have heartburn. This is why LPR is called SILENT REFLUX, and the terms "Silent reflux" and "LPR" are often used interchangeably. Because LPR is silent, it is sometimes difficult to diagnose.  How can you tell if you have LPR?  Marland Kitchen Chronic hoarseness- Some people have hoarseness that comes and goes . throat clearing  . Cough . It can cause shortness of breath and cause asthma like symptoms. Marland Kitchen a feeling of a lump in the throat  . difficulty swallowing . a problem with too much nose and throat drainage.  . Some people will feel their esophagus spasm which feels like their heart beating hard and fast, this will usually be after a meal, at rest, or lying down at night.    How do I treat this? Treatment for LPR should be individualized, and your doctor will suggest the best treatment for you. Generally there are several treatments for LPR: . changing habits and diet to reduce reflux,  . medications to reduce stomach acid, and  . surgery to prevent reflux. Most people with LPR need to modify how and when they eat, as well as take some medication, to get well. Sometimes, nonprescription liquid antacids, such as Maalox, Gelucil and Mylanta are recommended. When used, these antacids should be taken four times each day - one tablespoon one hour after each meal and before bedtime. Dietary and lifestyle changes alone  are not often enough to control LPR - medications that reduce stomach acid are also usually needed. These must be prescribed by our doctor.   TIPS FOR REDUCING REFLUX AND LPR Control your LIFE-STYLE and your DIET! Marland Kitchen If you use tobacco, QUIT.  Marland Kitchen Smoking makes you reflux. After every cigarette you have some LPR.  . Don't wear clothing that is too tight, especially around the waist (trousers, corsets, belts).  . Do not lie down just after eating...in fact, do not eat within three hours of bedtime.  . You should be on a low-fat diet.  . Limit your intake of red meat.  . Limit your intake of butter.  Marland Kitchen Avoid fried foods.  . Avoid chocolate  . Avoid cheese.  Marland Kitchen Avoid eggs. Marland Kitchen Specifically avoid caffeine (especially coffee and tea), soda pop (especially cola) and mints.  . Avoid alcoholic beverages, particularly in the evening.

## 2019-04-07 ENCOUNTER — Other Ambulatory Visit: Payer: Self-pay | Admitting: Physician Assistant

## 2019-04-07 DIAGNOSIS — F411 Generalized anxiety disorder: Secondary | ICD-10-CM

## 2019-04-07 LAB — URINALYSIS, ROUTINE W REFLEX MICROSCOPIC
Bilirubin Urine: NEGATIVE
Glucose, UA: NEGATIVE
Hgb urine dipstick: NEGATIVE
Ketones, ur: NEGATIVE
Leukocytes,Ua: NEGATIVE
Nitrite: NEGATIVE
Protein, ur: NEGATIVE
Specific Gravity, Urine: 1.008 (ref 1.001–1.03)
pH: 7.5 (ref 5.0–8.0)

## 2019-04-07 LAB — LIPID PANEL
Cholesterol: 170 mg/dL (ref <200)
HDL: 52 mg/dL (ref >=50)
LDL Cholesterol (Calc): 92 mg/dL (calc)
Non-HDL Cholesterol (Calc): 118 mg/dL (calc) (ref <130)
Total CHOL/HDL Ratio: 3.3 (calc) (ref <5.0)
Triglycerides: 162 mg/dL — ABNORMAL HIGH (ref <150)

## 2019-04-07 LAB — CBC WITH DIFFERENTIAL/PLATELET
Absolute Monocytes: 399 cells/uL (ref 200–950)
Basophils Absolute: 49 cells/uL (ref 0–200)
Basophils Relative: 0.7 %
Eosinophils Absolute: 70 cells/uL (ref 15–500)
Eosinophils Relative: 1 %
HCT: 40.3 % (ref 35.0–45.0)
Hemoglobin: 13.3 g/dL (ref 11.7–15.5)
Lymphs Abs: 1365 cells/uL (ref 850–3900)
MCH: 28.6 pg (ref 27.0–33.0)
MCHC: 33 g/dL (ref 32.0–36.0)
MCV: 86.7 fL (ref 80.0–100.0)
MPV: 11.5 fL (ref 7.5–12.5)
Monocytes Relative: 5.7 %
Neutro Abs: 5117 cells/uL (ref 1500–7800)
Neutrophils Relative %: 73.1 %
Platelets: 244 10*3/uL (ref 140–400)
RBC: 4.65 10*6/uL (ref 3.80–5.10)
RDW: 13.4 % (ref 11.0–15.0)
Total Lymphocyte: 19.5 %
WBC: 7 10*3/uL (ref 3.8–10.8)

## 2019-04-07 LAB — HEMOGLOBIN A1C
Hgb A1c MFr Bld: 5.4 % of total Hgb (ref <5.7)
Mean Plasma Glucose: 108 (calc)
eAG (mmol/L): 6 (calc)

## 2019-04-07 LAB — COMPLETE METABOLIC PANEL WITH GFR
AG Ratio: 1.7 (calc) (ref 1.0–2.5)
ALT: 17 U/L (ref 6–29)
AST: 14 U/L (ref 10–35)
Albumin: 4.5 g/dL (ref 3.6–5.1)
Alkaline phosphatase (APISO): 71 U/L (ref 37–153)
BUN: 16 mg/dL (ref 7–25)
CO2: 28 mmol/L (ref 20–32)
Calcium: 9.3 mg/dL (ref 8.6–10.4)
Chloride: 104 mmol/L (ref 98–110)
Creat: 0.96 mg/dL (ref 0.50–1.05)
GFR, Est African American: 78 mL/min/{1.73_m2} (ref >=60)
GFR, Est Non African American: 68 mL/min/{1.73_m2} (ref >=60)
Globulin: 2.6 g/dL (calc) (ref 1.9–3.7)
Glucose, Bld: 106 mg/dL — ABNORMAL HIGH (ref 65–99)
Potassium: 4.4 mmol/L (ref 3.5–5.3)
Sodium: 142 mmol/L (ref 135–146)
Total Bilirubin: 0.4 mg/dL (ref 0.2–1.2)
Total Protein: 7.1 g/dL (ref 6.1–8.1)

## 2019-04-07 LAB — VITAMIN D 25 HYDROXY (VIT D DEFICIENCY, FRACTURES): Vit D, 25-Hydroxy: 83 ng/mL (ref 30–100)

## 2019-04-07 LAB — MICROALBUMIN / CREATININE URINE RATIO
Creatinine, Urine: 7 mg/dL — ABNORMAL LOW (ref 20–275)
Microalb, Ur: <0.2 mg/dL

## 2019-04-07 LAB — MAGNESIUM: Magnesium: 2.1 mg/dL (ref 1.5–2.5)

## 2019-04-07 LAB — IRON, TOTAL/TOTAL IRON BINDING CAP
%SAT: 14 % (calc) — ABNORMAL LOW (ref 16–45)
Iron: 55 ug/dL (ref 45–160)
TIBC: 398 mcg/dL (calc) (ref 250–450)

## 2019-04-07 LAB — TSH: TSH: 2.33 mIU/L

## 2019-04-07 LAB — VITAMIN B12: Vitamin B-12: 497 pg/mL (ref 200–1100)

## 2019-04-10 MED ORDER — CYCLOBENZAPRINE HCL 10 MG PO TABS: 10.0000 mg | ORAL_TABLET | Freq: Two times a day (BID) | ORAL | 0 refills | Status: DC | PRN

## 2019-04-23 MED ORDER — AZITHROMYCIN 250 MG PO TABS: ORAL_TABLET | ORAL | 1 refills | Status: AC

## 2019-04-24 ENCOUNTER — Ambulatory Visit: Payer: 59 | Attending: Internal Medicine

## 2019-04-24 DIAGNOSIS — Z20822 Contact with and (suspected) exposure to covid-19: Secondary | ICD-10-CM

## 2019-04-26 ENCOUNTER — Other Ambulatory Visit: Payer: Self-pay | Admitting: Internal Medicine

## 2019-04-26 DIAGNOSIS — E782 Mixed hyperlipidemia: Secondary | ICD-10-CM

## 2019-04-26 LAB — NOVEL CORONAVIRUS, NAA: SARS-CoV-2, NAA: NOT DETECTED

## 2019-04-27 ENCOUNTER — Other Ambulatory Visit: Payer: Self-pay | Admitting: Physician Assistant

## 2019-04-27 DIAGNOSIS — E782 Mixed hyperlipidemia: Secondary | ICD-10-CM

## 2019-04-27 MED ORDER — ROSUVASTATIN CALCIUM 10 MG PO TABS: ORAL_TABLET | ORAL | 0 refills | Status: DC

## 2019-04-30 ENCOUNTER — Ambulatory Visit: Payer: 59 | Attending: Internal Medicine

## 2019-04-30 DIAGNOSIS — Z23 Encounter for immunization: Secondary | ICD-10-CM

## 2019-04-30 NOTE — Progress Notes (Signed)
   Covid-[facility] Vaccination Clinic  Name:  Wendy Levine    MRN: [id number] DOB: 1965/06/20  04/30/2019  Wendy Levine was observed post Covid-19 immunization for 15 minutes without incident. She was provided with Vaccine Information Sheet and instruction to access the V-Safe system.   Wendy Levine was instructed to call 911 with any severe reactions post vaccine: Marland Kitchen Difficulty breathing  . Swelling of face and throat  . A fast heartbeat  . A bad rash all over body  . Dizziness and weakness   Immunizations Administered    Name Date Dose VIS Date Route   Pfizer COVID-19 Vaccine 04/30/2019 10:30 AM 0.3 mL 02/03/2019 Intramuscular   Manufacturer: [place]   Lot: [id number]   [place]: [id number]

## 2019-05-08 DIAGNOSIS — M25512 Pain in left shoulder: Secondary | ICD-10-CM

## 2019-05-11 ENCOUNTER — Other Ambulatory Visit: Payer: Self-pay

## 2019-05-11 ENCOUNTER — Ambulatory Visit (INDEPENDENT_AMBULATORY_CARE_PROVIDER_SITE_OTHER): Payer: 59

## 2019-05-11 ENCOUNTER — Encounter: Payer: Self-pay | Admitting: Orthopaedic Surgery

## 2019-05-11 ENCOUNTER — Ambulatory Visit: Payer: 59 | Admitting: Orthopaedic Surgery

## 2019-05-11 DIAGNOSIS — M25562 Pain in left knee: Secondary | ICD-10-CM

## 2019-05-11 DIAGNOSIS — M25511 Pain in right shoulder: Secondary | ICD-10-CM

## 2019-05-11 DIAGNOSIS — G8929 Other chronic pain: Secondary | ICD-10-CM

## 2019-05-11 DIAGNOSIS — M25561 Pain in right knee: Secondary | ICD-10-CM

## 2019-05-11 MED ORDER — BUPIVACAINE HCL 0.25 % IJ SOLN
2.0000 mL | INTRAMUSCULAR | Status: AC | PRN
  Administered 2019-05-11: 2 mL via INTRA_ARTICULAR

## 2019-05-11 MED ORDER — METHYLPREDNISOLONE ACETATE 40 MG/ML IJ SUSP
40.0000 mg | INTRAMUSCULAR | Status: AC | PRN
  Administered 2019-05-11: 11:00:00 40 mg via INTRA_ARTICULAR

## 2019-05-11 MED ORDER — LIDOCAINE HCL 2 % IJ SOLN
2.0000 mL | INTRAMUSCULAR | Status: AC | PRN
  Administered 2019-05-11: 2 mL

## 2019-05-11 MED ORDER — METHYLPREDNISOLONE ACETATE 40 MG/ML IJ SUSP
40.0000 mg | INTRAMUSCULAR | Status: AC | PRN
  Administered 2019-05-11: 40 mg via INTRA_ARTICULAR

## 2019-05-11 MED ORDER — BUPIVACAINE HCL 0.25 % IJ SOLN
2.0000 mL | INTRAMUSCULAR | Status: AC | PRN
  Administered 2019-05-11: 11:00:00 2 mL via INTRA_ARTICULAR

## 2019-05-11 MED ORDER — LIDOCAINE HCL 1 % IJ SOLN
2.0000 mL | INTRAMUSCULAR | Status: AC | PRN
  Administered 2019-05-11: 2 mL

## 2019-05-11 NOTE — Progress Notes (Signed)
Office Visit Note   Patient: Wendy Levine           Date of Birth: 02/27/1965           MRN: [id number] Visit Date: 05/11/2019              Requested by: Vicie Mutters, PA-C [address] [place],  [place] [address] PCP: Unk Pinto, MD   Assessment & Plan: Visit Diagnoses:  1. Acute pain of right shoulder   2. Chronic pain of right knee   3. Chronic pain of left knee     Plan: Impression is right shoulder subacromial bursitis and left knee degenerative medial meniscus tear.  We will inject both the right subacromial space and left knee joint with cortisone today.  She will follow up with [place] as needed.  Follow-Up Instructions: Return if symptoms worsen or fail to improve.   Orders:  Orders Placed This Encounter  Procedures  . Large Joint Inj: L knee  . Large Joint Inj: R subacromial bursa  . XR Shoulder Right  . XR KNEE 3 VIEW LEFT   No orders of the defined types were placed in this encounter.     Procedures: Large Joint Inj: L knee on 05/11/2019 11:04 AM Indications: pain Details: 22 G needle, anterolateral approach Medications: 2 mL lidocaine 1 %; 2 mL bupivacaine 0.25 %; 40 mg methylPREDNISolone acetate 40 MG/ML  Large Joint Inj: R subacromial bursa on 05/11/2019 11:05 AM Indications: pain Details: 22 G needle Medications: 2 mL lidocaine 2 %; 2 mL bupivacaine 0.25 %; 40 mg methylPREDNISolone acetate 40 MG/ML Outcome: tolerated well, no immediate complications Patient was prepped and draped in the usual sterile fashion.       Clinical Data: No additional findings.   Subjective: Chief Complaint  Patient presents with  . Left Shoulder - Pain    HPI patient is a pleasant 54 year old female who comes in today with left knee and right shoulder pain.  She notes that she had several trees cut down approximately 1 week ago with scrambling to on plant and replant bushes and flowers during the course of the day.  She did a lot of  heavy lifting at that time.  Since then, she has noticed pain to the right shoulder.  The pain is primarily to the anterior aspect.  She does note a clicking sensation with rotation of the shoulder.  She has been using ice packs without significant relief of symptoms.  No previous cortisone injection.  In regards to the left knee, the pain she has is to the medial aspect.  No mechanical symptoms.  No specific aggravators.  When she does have the pain, however she does note a sharp shooting pain to the medial aspect.  She has mild relief walking pigeon toed.  She does note a left knee arthroscopic debridement for meniscal tear approximately 7 years ago.  She was doing well until last week.  Review of Systems as detailed in HPI.  All others reviewed and are negative.   Objective: Vital Signs: There were no vitals taken for this visit.  Physical Exam well-developed well-nourished female no acute distress.  Alert and oriented x3.  Ortho Exam examination of the right shoulder revealed near full active range of motion.  Negative empty can.  Positive cross body adduction.  No focal weakness.  Moderate tenderness to the [facility] joint.  Negative O'Brien's and speeds test.  She is neurovascularly intact distally.  Left knee exam shows a  trace effusion.  Range of motion 0 to 100 degrees.  Marked tenderness to the medial joint line with a positive medial McMurray.  Mild patellofemoral crepitus.  Ligaments are stable.  She is neurovascular intact distally.  Specialty Comments:  No specialty comments available.  Imaging: XR KNEE 3 VIEW LEFT  Result Date: 05/11/2019 Moderate tricompartmental degenerative changes  XR Shoulder Right  Result Date: 05/11/2019 Moderate degenerative changes to the [facility] joint.  Mild glenohumeral changes.  Questionable old fracture to the glenoid.    PMFS History: Patient Active Problem List   Diagnosis Date Noted  . Hip flexor tightness 01/11/2018  . Cervical radiculopathy at C6  12/14/2017  . Depression, major, recurrent, in partial remission ([place]) 11/29/2017  . Obesity 06/19/2014  . Insulin resistance 06/19/2014  . OSA (obstructive sleep apnea) 06/19/2014  . Vitamin D deficiency 06/19/2014  . Medication management 06/19/2014  . Anemia 06/19/2014  . Primary localized osteoarthrosis, lower leg 10/06/2013  . Hypercholesteremia   . Hypertension   . HSV-2 infection    Past Medical History:  Diagnosis Date  . Allergy   . Anemia   . Anxiety   . ASCUS (atypical squamous cells of undetermined significance) on Pap smear    neg HR HPV 09/2008, ascus 06/2009,10/2009  . HSV-2 infection   . Hypercholesteremia   . Hypertension   . OSA on CPAP   . Pes planus of both feet 06/20/2013  . Prediabetes   . Sleep apnea     Family History  Problem Relation Age of Onset  . Hypertension Maternal Grandmother   . Breast cancer Maternal Grandmother 80  . Emphysema Maternal Grandmother   . Hypertension Mother   . Hyperlipidemia Mother   . Cancer Mother        Skin  . Colon cancer Neg Hx     Past Surgical History:  Procedure Laterality Date  . CESAREAN SECTION    . COLONOSCOPY  2005?   in [place] -"normal"  . COLPOSCOPY    . KNEE SURGERY     Arthroscopic  . MOUTH SURGERY    . Nexplanon insertion  05/22/2015  . remove moles     Social History   Occupational History  . 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 Smoking status: Former Smoker    Packs/day: 0.30    Years: 10.00    Pack years: 3.00    Types: Cigarettes    Quit date: 06/08/1999    Years since quitting: 19.9  . Smokeless tobacco: Never Used  Substance and Sexual Activity  . Alcohol use: Yes    Alcohol/week: 11.0 - 15.0 standard drinks    Types: 7 Standard drinks or equivalent, 4 - 8 Glasses of wine per week  . Drug use: No  . Sexual activity: Yes    Partners: Male    Birth control/protection: Condom    Comment: Nexplanon 05/22/2015 1st intercourse 16 yo-5 partners

## 2019-05-15 NOTE — Progress Notes (Addendum)
Subjective:    Patient ID: Wendy Levine, female    DOB: 08-03-1965, 54 y.o.   MRN: [id number]  HPI 54 y.o. obese 54 year old female presents for follow up. She was just recently seen for a CPE in Feb. She was complaining of shoulder, knee and back pain. She was sent to Dr. Erlinda Hong, saw him on 03/18 and got a steroid injection right shoulder and left knee.   She came in with a sling on her right shoulder, states her shoulder hurts more with just resting/or having her arm hang, worse at night. She had an Xray 05/11/19 that showed questionable old fracture to the glenoid and the patient is very concerned about this. She is afraid that this is the cause of her symptoms. No known injury, has been helping take care of her mother who has been living with her,  first complained about right shoulder pain 03/2018.   Has stopped topamax, not helping back or shoulder pain. She is requesting injections in bilateral back, continuing back pain.  Ibuprofen does help but she has been trying to avoid it due to kidney function.    Blood pressure 132/76, pulse 72, temperature (!) 97.5 F (36.4 C), weight 255 lb (115.7 kg), SpO2 98 %.  Medications  Current Outpatient Medications (Endocrine & Metabolic):  .  etonogestrel (NEXPLANON) 68 MG IMPL implant, 1 each by Subdermal route once.  Current Outpatient Medications (Cardiovascular):  .  furosemide (LASIX) 40 MG tablet, Take 1 tablet (40 mg total) by mouth daily. .  rosuvastatin (CRESTOR) 10 MG tablet, Take 1 tablet Daily for Cholesterol   Current Outpatient Medications (Analgesics):  .  meloxicam (MOBIC) 15 MG tablet, Take one daily with food for 2 weeks, can take with tylenol, can not take with aleve, iburpofen, then as needed daily for pain  Current Outpatient Medications (Hematological):  Marland Kitchen  Cyanocobalamin 1000 MCG/15ML LIQD, Take by mouth daily. .  IRON PO, Take by mouth daily. Reported on 05/28/2015  Current Outpatient Medications (Other):  Marland Kitchen   ALPRAZolam (XANAX) 0.5 MG tablet, TAKE 1/2 TO 1 TABLET BY MOUTH TWO TO THREE TIMES A DAY AS NEEDED FOR ANXIETY OR PANIC ATTACK .  cyclobenzaprine (FLEXERIL) 10 MG tablet, Take 1 tablet (10 mg total) by mouth 2 (two) times daily as needed for muscle spasms. Marland Kitchen  escitalopram (LEXAPRO) 20 MG tablet, Take 1 tablet Daily for Mood .  fluconazole (DIFLUCAN) 150 MG tablet, Take 1 tablet (150 mg total) by mouth daily. Marland Kitchen  MAGNESIUM PO, Take by mouth as needed. Reported on 06/27/2015 .  valACYclovir (VALTREX) 500 MG tablet, Take 1 to  2 tablets Daily Prophylaxis for Fever Blisters  / Cold Sores .  Vitamin D, Ergocalciferol, (DRISDOL) 1.25 MG (50000 UT) CAPS capsule, Take 1 capsule 3 x  /week for severe Vitamin D  Deficiency (Patient taking differently: Take 1 capsule 1 x  /week for severe Vitamin D  Deficiency)  Problem list She has Hypercholesteremia; Hypertension; HSV-2 infection; Primary localized osteoarthrosis, lower leg; Obesity; Insulin resistance; OSA (obstructive sleep apnea); Vitamin D deficiency; Medication management; Anemia; Depression, major, recurrent, in partial remission ([place]); Cervical radiculopathy at C6; and Hip flexor tightness on their problem list.   Review of Systems  Constitutional: Negative for chills, fatigue and fever.  HENT: Negative.   Respiratory: Negative.   Cardiovascular: Negative.   Gastrointestinal: Negative for nausea and vomiting.  Genitourinary: Negative for difficulty urinating, dysuria, flank pain, frequency, hematuria and urgency.  Musculoskeletal: Positive for arthralgias, back pain,  gait problem and joint swelling. Negative for myalgias, neck pain and neck stiffness.       Objective:   Physical Exam  Exam not done today. See exam from CPE Patient has right arm in a sling.  Non antalgic gait.      Assessment & Plan:    Acute pain of right shoulder Had recent steroid injection, in shoulder and knee- some improvement in her left knee Patient is concerned  that the pain may be from possible old glenoid fracture seen on Xray- she is requesting second opinion, her mother has seen Dr. Tamera Punt and she would like to see him if possible.  Okay to take ibuprofen intermittently which does help with the pain May need CT/MRI but would prefer for ortho to make the evaluation imaging.   Acute bilateral low back pain without sciatica ? Left hip bursitis versus lumbar DDD Can take ibuprofen, I do not want to do an injection today since the patient just had 2 injections for her shoulder and knee, will wait.   Suggest follow up with ortho for second opinion.

## 2019-05-17 ENCOUNTER — Ambulatory Visit: Payer: 59 | Admitting: Physician Assistant

## 2019-05-17 ENCOUNTER — Other Ambulatory Visit: Payer: Self-pay

## 2019-05-17 ENCOUNTER — Encounter: Payer: Self-pay | Admitting: Physician Assistant

## 2019-05-17 VITALS — BP 132/76 | HR 72 | Temp 97.5°F | Wt 255.0 lb

## 2019-05-17 DIAGNOSIS — M545 Low back pain, unspecified: Secondary | ICD-10-CM

## 2019-05-17 DIAGNOSIS — M25511 Pain in right shoulder: Secondary | ICD-10-CM | POA: Diagnosis not present

## 2019-05-17 NOTE — Patient Instructions (Signed)
Aleve, ibuprofen is an antiinflammatory You can take tylenol (500mg ) or tylenol arthritis (650mg ) with the meloxicam/antiinflammatories. The max you can take of tylenol a day is 3000mg  daily, this is a max of 6 pills a day of the regular tyelnol (500mg ) or a max of 4 a day of the tylenol arthritis (650mg ) as long as no other medications you are taking contain tylenol.   this can cause inflammation in your stomach and can cause ulcers or bleeding, this will look like black tarry stools Make sure you taking it with food Try not to take it daily, take AS needed Can take with pepcid   SLAP Lesions Rehab Ask your health care provider which exercises are safe for you. Do exercises exactly as told by your health care provider and adjust them as directed. It is normal to feel mild stretching, pulling, tightness, or discomfort as you do these exercises. Stop right away if you feel sudden pain or your pain gets worse. Do not begin these exercises until told by your health care provider. Stretching and range-of-motion exercise This exercise warms up your muscles and joints and improves the movement and flexibility of your shoulder. This exercise also helps to relieve pain and stiffness. Passive shoulder horizontal adduction In passive adduction, you use your other hand to move the injured arm toward your body. The injured arm does not move on its own (passive). In this movement, your arm is moved across your body in the horizontal plane (horizontal adduction). Sit or stand and pull your left / right elbow across your chest, toward your other shoulder. Stop when you feel a gentle stretch in the back of your shoulder and upper arm. Keep your arm at shoulder height. Keep your arm as close to your body as you comfortably can. Hold for __________ seconds. Slowly return to the starting position. Repeat __________ times. Complete this exercise __________ times a day. Strengthening exercises These exercises build  strength and endurance in your shoulder. Endurance is the ability to use your muscles for a long time, even after they get tired. Scapular protraction, supine  Lie on your back on a firm surface (supine position). Hold a __________ lb / kg weight in your left / right hand. Raise your left / right arm straight into the air so your hand is directly above your shoulder joint. Push the weight into the air so your shoulder (scapula) lifts off the surface that you are lying on. The scapula will push up or forward (protraction). Do not move your head, neck, or back. Hold for __________ seconds. Slowly return to the starting position. Let your muscles relax completely before you repeat this exercise. Repeat __________ times. Complete this exercise __________ times a day. Scapular retraction  Sit in a stable chair without armrests, or stand up. Secure an exercise band to a stable object in front of you so the band is at shoulder height. Hold one end of the exercise band in each hand. Your palms should face down. Squeeze your shoulder blades (scapulae) together and move your elbows slightly behind you (retraction). Do not shrug your shoulders. Hold for __________ seconds. Slowly return to the starting position. Repeat __________ times. Complete this exercise __________ times a day. Shoulder external rotation Lie down on your uninjured side. Place a soft object, such as a small folded towel, between your left / right arm (your top arm) and your body. Bend your left / right elbow to a 90-degree angle (right angle). Place your left /  right hand palm-down on your abdomen. Squeeze your shoulder blade back. Keeping your elbow bent to a 90-degree angle, move your left / right forearm away from your abdomen (external rotation). Your upper arm should not move off the folded towel. Keep your shoulder blade back. Hold for __________ seconds. Slowly return to the starting position. Repeat __________ times.  Complete this exercise __________ times a day. Shoulder extension, prone  Lie on your abdomen (prone position) on a firm surface so your left / right arm hangs over the edge. Hold a __________ lb / kg weight in your hand so your palm faces in toward your body. Your arm should be straight. Squeeze your shoulder blade down toward the middle of your back. Slowly raise your arm behind you, up to the height of the surface that you are lying on (extension). Keep your arm straight. Hold for __________ seconds. Slowly return to the starting position and relax your muscles. Repeat __________ times. Complete this exercise __________ times a day. This information is not intended to replace advice given to you by your health care provider. Make sure you discuss any questions you have with your health care provider. Document Revised: 06/07/2018 Document Reviewed: 06/07/2018 Elsevier Patient Education  [place].

## 2019-05-19 ENCOUNTER — Other Ambulatory Visit: Payer: Self-pay | Admitting: Physician Assistant

## 2019-05-19 DIAGNOSIS — F411 Generalized anxiety disorder: Secondary | ICD-10-CM

## 2019-05-30 ENCOUNTER — Ambulatory Visit: Payer: 59 | Attending: Internal Medicine

## 2019-05-30 DIAGNOSIS — Z23 Encounter for immunization: Secondary | ICD-10-CM

## 2019-05-30 NOTE — Progress Notes (Signed)
   Covid-19 Vaccination Clinic  Name:  Wendy Levine    MRN: [id number] DOB: 1966/02/06  05/30/2019  [name] was observed post Covid-19 immunization for 15 minutes without incident. She was provided with Vaccine Information Sheet and instruction to access the V-Safe system.   [name] was instructed to call 911 with any severe reactions post vaccine: Marland Kitchen Difficulty breathing  . Swelling of face and throat  . A fast heartbeat  . A bad rash all over body  . Dizziness and weakness   Immunizations Administered    Name Date Dose VIS Date Route   Pfizer COVID-19 Vaccine 05/30/2019 11:04 AM 0.3 mL 02/03/2019 Intramuscular   Manufacturer: [place]   Lot: [id number]   [place]: [id number]

## 2019-06-18 ENCOUNTER — Other Ambulatory Visit: Payer: Self-pay | Admitting: Physician Assistant

## 2019-06-22 ENCOUNTER — Other Ambulatory Visit: Payer: Self-pay | Admitting: Physician Assistant

## 2019-06-22 MED ORDER — ONDANSETRON HCL 4 MG PO TABS: 4.0000 mg | ORAL_TABLET | Freq: Every day | ORAL | 1 refills | Status: DC | PRN

## 2019-06-29 ENCOUNTER — Other Ambulatory Visit: Payer: Self-pay | Admitting: Physician Assistant

## 2019-06-29 DIAGNOSIS — F411 Generalized anxiety disorder: Secondary | ICD-10-CM

## 2019-07-05 ENCOUNTER — Ambulatory Visit: Payer: 59 | Admitting: Physician Assistant

## 2019-08-07 MED ORDER — FUROSEMIDE 40 MG PO TABS: ORAL_TABLET | ORAL | 0 refills | Status: DC

## 2019-08-23 ENCOUNTER — Other Ambulatory Visit: Payer: Self-pay | Admitting: Physician Assistant

## 2019-08-23 DIAGNOSIS — F411 Generalized anxiety disorder: Secondary | ICD-10-CM

## 2019-09-01 DIAGNOSIS — M5440 Lumbago with sciatica, unspecified side: Secondary | ICD-10-CM

## 2019-09-01 MED ORDER — GABAPENTIN 300 MG PO CAPS: 300.0000 mg | ORAL_CAPSULE | Freq: Three times a day (TID) | ORAL | 2 refills | Status: DC

## 2019-09-01 MED ORDER — PREDNISONE 20 MG PO TABS: ORAL_TABLET | ORAL | 0 refills | Status: DC

## 2019-09-20 NOTE — Addendum Note (Signed)
Addended by: Vicie Mutters R on: 09/20/2019 02:09 PM   Modules accepted: Orders

## 2019-09-21 ENCOUNTER — Other Ambulatory Visit: Payer: Self-pay

## 2019-09-21 ENCOUNTER — Ambulatory Visit
Admission: RE | Admit: 2019-09-21 | Discharge: 2019-09-21 | Disposition: A | Discharge status: Home / Self Care | Payer: 59 | Source: Ambulatory Visit | Attending: Physician Assistant | Admitting: Physician Assistant

## 2019-09-21 DIAGNOSIS — M5440 Lumbago with sciatica, unspecified side: Secondary | ICD-10-CM
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
## 2019-09-22 ENCOUNTER — Encounter: Payer: Self-pay | Admitting: Physician Assistant

## 2019-09-22 ENCOUNTER — Ambulatory Visit: Payer: 59 | Admitting: Physician Assistant

## 2019-09-22 ENCOUNTER — Other Ambulatory Visit: Payer: Self-pay

## 2019-09-22 VITALS — BP 132/74 | HR 94 | Temp 97.3°F | Wt 263.0 lb

## 2019-09-22 DIAGNOSIS — M545 Low back pain, unspecified: Secondary | ICD-10-CM

## 2019-09-22 DIAGNOSIS — G8929 Other chronic pain: Secondary | ICD-10-CM | POA: Diagnosis not present

## 2019-09-22 MED ORDER — DEXAMETHASONE SODIUM PHOSPHATE 100 MG/10ML IJ SOLN: 10.0000 mg | Freq: Once | INTRAMUSCULAR | Status: DC

## 2019-09-22 MED ORDER — TRAMADOL HCL 50 MG PO TABS: ORAL_TABLET | ORAL | 0 refills | Status: DC

## 2019-09-22 NOTE — Patient Instructions (Signed)
Go to the ER if you have any new weakness in your legs, have trouble controlling your urine or bowels, or have worsening pain.   BACK PAIN  Try the exercises and other information in the back care manual.   You can take meloxicam once during the day as needed (avoid taking other NSAIDS like Alleve or Ibuprofen while taking this)   You can take flexeril if needed at bedtime for muscle spasm. This can be taken up to every 8 hours, but causes sedation, so should not drive or operate heavy machinery while taking this medicine.   Go to the ER if you have any new weakness in your legs, have trouble controlling your urine or bowels, or have worsening pain.   If you are not better in 1-3 month we will refer you to ortho   Back pain Rehab Ask your health care provider which exercises are safe for you. Do exercises exactly as told by your health care provider and adjust them as directed. It is normal to feel mild stretching, pulling, tightness, or discomfort as you do these exercises, but you should stop right away if you feel sudden pain or your pain gets worse. Do not begin these exercises until told by your health care provider. Stretching and range of motion exercises These exercises warm up your muscles and joints and improve the movement and flexibility of your hips and your back. These exercises also help to relieve pain, numbness, and tingling. Exercise A: Sciatic nerve glide 1. Sit in a chair with your head facing down toward your chest. Place your hands behind your back. Let your shoulders slump forward. 2. Slowly straighten one of your knees while you tilt your head back as if you are looking toward the ceiling. Only straighten your leg as far as you can without making your symptoms worse. 3. Hold for __________ seconds. 4. Slowly return to the starting position. 5. Repeat with your other leg. Repeat __________ times. Complete this exercise __________ times a day. Exercise B: Knee to chest  with hip adduction and internal rotation  1. Lie on your back on a firm surface with both legs straight. 2. Bend one of your knees and move it up toward your chest until you feel a gentle stretch in your lower back and buttock. Then, move your knee toward the shoulder that is on the opposite side from your leg. ? Hold your leg in this position by holding onto the front of your knee. 3. Hold for __________ seconds. 4. Slowly return to the starting position. 5. Repeat with your other leg. Repeat __________ times. Complete this exercise __________ times a day. Exercise C: Prone extension on elbows  1. Lie on your abdomen on a firm surface. A bed may be too soft for this exercise. 2. Prop yourself up on your elbows. 3. Use your arms to help lift your chest up until you feel a gentle stretch in your abdomen and your lower back. ? This will place some of your body weight on your elbows. If this is uncomfortable, try stacking pillows under your chest. ? Your hips should stay down, against the surface that you are lying on. Keep your hip and back muscles relaxed. 4. Hold for __________ seconds. 5. Slowly relax your upper body and return to the starting position. Repeat __________ times. Complete this exercise __________ times a day. Strengthening exercises These exercises build strength and endurance in your back. Endurance is the ability to use your muscles for  a long time, even after they get tired. Exercise D: Pelvic tilt 1. Lie on your back on a firm surface. Bend your knees and keep your feet flat. 2. Tense your abdominal muscles. Tip your pelvis up toward the ceiling and flatten your lower back into the floor. ? To help with this exercise, you may place a small towel under your lower back and try to push your back into the towel. 3. Hold for __________ seconds. 4. Let your muscles relax completely before you repeat this exercise. Repeat __________ times. Complete this exercise __________  times a day. Exercise E: Alternating arm and leg raises  1. Get on your hands and knees on a firm surface. If you are on a hard floor, you may want to use padding to cushion your knees, such as an exercise mat. 2. Line up your arms and legs. Your hands should be below your shoulders, and your knees should be below your hips. 3. Lift your left leg behind you. At the same time, raise your right arm and straighten it in front of you. ? Do not lift your leg higher than your hip. ? Do not lift your arm higher than your shoulder. ? Keep your abdominal and back muscles tight. ? Keep your hips facing the ground. ? Do not arch your back. ? Keep your balance carefully, and do not hold your breath. 4. Hold for __________ seconds. 5. Slowly return to the starting position and repeat with your right leg and your left arm. Repeat __________ times. Complete this exercise __________ times a day. Posture and body mechanics  Body mechanics refers to the movements and positions of your body while you do your daily activities. Posture is part of body mechanics. Good posture and healthy body mechanics can help to relieve stress in your body's tissues and joints. Good posture means that your spine is in its natural S-curve position (your spine is neutral), your shoulders are pulled back slightly, and your head is not tipped forward. The following are general guidelines for applying improved posture and body mechanics to your everyday activities. Standing   When standing, keep your spine neutral and your feet about hip-width apart. Keep a slight bend in your knees. Your ears, shoulders, and hips should line up.  When you do a task in which you stand in one place for a long time, place one foot up on a stable object that is 2-4 inches (5-10 cm) high, such as a footstool. This helps keep your spine neutral. Sitting   When sitting, keep your spine neutral and keep your feet flat on the floor. Use a footrest, if  necessary, and keep your thighs parallel to the floor. Avoid rounding your shoulders, and avoid tilting your head forward.  When working at a desk or a computer, keep your desk at a height where your hands are slightly lower than your elbows. Slide your chair under your desk so you are close enough to maintain good posture.  When working at a computer, place your monitor at a height where you are looking straight ahead and you do not have to tilt your head forward or downward to look at the screen. Resting   When lying down and resting, avoid positions that are most painful for you.  If you have pain with activities such as sitting, bending, stooping, or squatting (flexion-based activities), lie in a position in which your body does not bend very much. For example, avoid curling up on your side  with your arms and knees near your chest (fetal position).  If you have pain with activities such as standing for a long time or reaching with your arms (extension-based activities), lie with your spine in a neutral position and bend your knees slightly. Try the following positions: ? Lying on your side with a pillow between your knees. ? Lying on your back with a pillow under your knees. Lifting   When lifting objects, keep your feet at least shoulder-width apart and tighten your abdominal muscles.  Bend your knees and hips and keep your spine neutral. It is important to lift using the strength of your legs, not your back. Do not lock your knees straight out.  Always ask for help to lift heavy or awkward objects. This information is not intended to replace advice given to you by your health care provider. Make sure you discuss any questions you have with your health care provider. Document Released: 02/09/2005 Document Revised: 10/17/2015 Document Reviewed: 10/26/2014 Elsevier Interactive Patient Education  [employer].

## 2019-10-03 ENCOUNTER — Other Ambulatory Visit: Payer: Self-pay | Admitting: Physician Assistant

## 2019-10-03 DIAGNOSIS — G8929 Other chronic pain: Secondary | ICD-10-CM

## 2019-10-10 ENCOUNTER — Other Ambulatory Visit: Payer: Self-pay | Admitting: Adult Health Nurse Practitioner

## 2019-10-10 ENCOUNTER — Other Ambulatory Visit: Payer: Self-pay | Admitting: Physician Assistant

## 2019-10-10 DIAGNOSIS — F411 Generalized anxiety disorder: Secondary | ICD-10-CM

## 2019-10-12 ENCOUNTER — Other Ambulatory Visit: Payer: Self-pay

## 2019-10-12 ENCOUNTER — Ambulatory Visit
Admission: RE | Admit: 2019-10-12 | Discharge: 2019-10-12 | Disposition: A | Discharge status: Home / Self Care | Payer: 59 | Source: Ambulatory Visit | Attending: Physician Assistant | Admitting: Physician Assistant

## 2019-10-12 DIAGNOSIS — G8929 Other chronic pain: Secondary | ICD-10-CM

## 2019-10-12 DIAGNOSIS — M545 Low back pain, unspecified: Secondary | ICD-10-CM

## 2019-10-19 MED ORDER — AMITRIPTYLINE HCL 10 MG PO TABS: ORAL_TABLET | ORAL | 1 refills | Status: DC

## 2019-10-24 ENCOUNTER — Encounter: Payer: Self-pay | Admitting: Orthopaedic Surgery

## 2019-10-24 NOTE — Telephone Encounter (Signed)
I think the referral was sent to the wrong person.

## 2019-10-25 ENCOUNTER — Other Ambulatory Visit: Payer: Self-pay | Admitting: Internal Medicine

## 2019-10-25 ENCOUNTER — Telehealth: Payer: Self-pay | Admitting: Physician Assistant

## 2019-10-25 DIAGNOSIS — F411 Generalized anxiety disorder: Secondary | ICD-10-CM

## 2019-10-25 NOTE — Telephone Encounter (Signed)
Patient called to request lexapro ran out today, please refill

## 2019-11-29 ENCOUNTER — Other Ambulatory Visit: Payer: Self-pay | Admitting: Internal Medicine

## 2019-11-30 ENCOUNTER — Encounter: Payer: Self-pay | Admitting: Family Medicine

## 2019-11-30 ENCOUNTER — Ambulatory Visit (INDEPENDENT_AMBULATORY_CARE_PROVIDER_SITE_OTHER): Payer: 59 | Admitting: Family Medicine

## 2019-11-30 ENCOUNTER — Other Ambulatory Visit: Payer: Self-pay

## 2019-11-30 ENCOUNTER — Ambulatory Visit: Payer: Self-pay

## 2019-11-30 VITALS — BP 140/90 | HR 71 | Ht 65.0 in | Wt 265.0 lb

## 2019-11-30 DIAGNOSIS — G8929 Other chronic pain: Secondary | ICD-10-CM

## 2019-11-30 DIAGNOSIS — M25562 Pain in left knee: Secondary | ICD-10-CM | POA: Diagnosis not present

## 2019-11-30 DIAGNOSIS — M7050 Other bursitis of knee, unspecified knee: Secondary | ICD-10-CM | POA: Diagnosis not present

## 2019-11-30 NOTE — Progress Notes (Signed)
Wendy Levine [address] [place] Phone: [phone] Subjective:   I Wendy Levine am serving as a Education administrator for Dr. Hulan Saas.   This visit occurred during the SARS-CoV-2 public health emergency.  Safety protocols were in place, including screening questions prior to the visit, additional usage of staff PPE, and extensive cleaning of exam room while observing appropriate contact time as indicated for disinfecting solutions.   I'm seeing this patient by the request  of:  Unk Pinto, MD  CC: Left knee pain  [id number]   01/11/2018 Patient has had more of the hip flexor tightness.  We discussed with patient at great length about stretching, core strengthening, hip abductor strengthening.  Patient work with Product/process development scientist.  We will get x-rays to rule out any other arthritic changes that could be potentially contributing.  Follow-up again in 4 to 8 weeks  Update 11/30/2019 Wendy Levine is a 54 y.o. female coming in with complaint of left knee and back pain. Patient states she has sharp medial knee pain. Most painful with going down stairs and twisting. Has radiating pain from the back to the gluts down the legs. Has tried ice, heat, topical and oral medications and none of them have helped the knee. States the pain is sharp. Weight bearing can also be painful as well. 10/10 at its worse. MRI of the back about a month ago.  Patient did have the MRI of the back.  This was independently visualized by me.  Patient does have facet arthropathy fairly severe at the L4-L5 which may be some mild nerve impingement in the foraminal area.  Otherwise fairly unremarkable.       Past Medical History:  Diagnosis Date  . Allergy   . Anemia   . Anxiety   . ASCUS (atypical squamous cells of undetermined significance) on Pap smear    neg HR HPV 09/2008, ascus 06/2009,10/2009  . Fatty liver 08/2018   ultrasound  . HSV-2 infection   .  Hypercholesteremia   . Hypertension   . OSA on CPAP   . Pes planus of both feet 06/20/2013  . Prediabetes   . Sleep apnea    Past Surgical History:  Procedure Laterality Date  . CESAREAN SECTION    . COLONOSCOPY  2005?   in [place] -"normal"  . COLPOSCOPY    . KNEE SURGERY     Arthroscopic  . MOUTH SURGERY    . Nexplanon insertion  05/22/2015  . remove moles     Social History   Socioeconomic History  . Marital status: Divorced    Spouse name: Not on file  . Number of children: Not on file  . Years of education: Not on file  . Highest education level: Not on file  Occupational History  . 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 Smoking status: Former Smoker    Packs/day: 0.30    Years: 10.00    Pack years: 3.00    Types: Cigarettes    Quit date: 06/08/1999    Years since quitting: 20.4  . Smokeless tobacco: Never Used  Vaping Use  . Vaping Use: Never used  Substance and Sexual Activity  . Alcohol use: Yes    Alcohol/week: 11.0 - 15.0 standard drinks    Types: 7 Standard drinks or equivalent, 4 - 8 Glasses of wine per week  . Drug use: No  . Sexual activity: Yes    Partners: Male    Birth control/protection:  Condom    Comment: Nexplanon 05/22/2015 1st intercourse 16 yo-5 partners  Other Topics Concern  . Not on file  Social History Narrative   Originally from [place]. Has lived in [place] as well. Previously has traveled to most of the 21 states as well as [place]. Has prior travel to [place]. Previously has worked in Personal assistant and also with veterinarians. Has dogs and cats at home. No bird exposure. No mold exposure. Does have a hot tub/pool combination.    Social Determinants of Health   Financial Resource Strain:   . Difficulty of Paying Living Expenses: Not on file  Food Insecurity:   . Worried About Charity fundraiser in the Last Year: Not on file  . Ran Out of Food in the Last Year: Not on file  Transportation Needs:   . Lack of  Transportation (Medical): Not on file  . Lack of Transportation (Non-Medical): Not on file  Physical Activity:   . Days of Exercise per Week: Not on file  . Minutes of Exercise per Session: Not on file  Stress:   . Feeling of Stress : Not on file  Social Connections:   . Frequency of Communication with Friends and Family: Not on file  . Frequency of Social Gatherings with Friends and Family: Not on file  . Attends Religious Services: Not on file  . Active Member of Clubs or Organizations: Not on file  . Attends Archivist Meetings: Not on file  . Marital Status: Not on file   Allergies  Allergen Reactions  . Doxycycline Hives and Shortness Of Breath  . Ace Inhibitors     cough  . Adhesive [Tape]   . Augmentin [Amoxicillin-Pot Clavulanate] Nausea Only    diarrhea  . Clindamycin/Lincomycin   . Epinephrine-Lidocaine-Na Metabisulfite [Lidocaine-Epinephrine]     Has heart palpitations from the epi, wants without  . Levaquin [Levofloxacin] Other (See Comments)    Dysphoria  . Vicodin [Hydrocodone-Acetaminophen] Itching   Family History  Problem Relation Age of Onset  . Hypertension Maternal Grandmother   . Breast cancer Maternal Grandmother 80  . Emphysema Maternal Grandmother   . Hypertension Mother   . Hyperlipidemia Mother   . Cancer Mother        Skin  . Colon cancer Neg Hx     Current Outpatient Medications (Endocrine & Metabolic):  .  etonogestrel (NEXPLANON) 68 MG IMPL implant, 1 each by Subdermal route once.  Current Facility-Administered Medications (Endocrine & Metabolic):  .  dexamethasone (DECADRON) injection 10 mg  Current Outpatient Medications (Cardiovascular):  .  furosemide (LASIX) 40 MG tablet, Take 1 tablet Daily for BP & Fluid Retention / Ankle Swelling .  rosuvastatin (CRESTOR) 10 MG tablet, Take 1 tablet Daily for Cholesterol     Current Outpatient Medications (Analgesics):  .  meloxicam (MOBIC) 15 MG tablet, Take one daily with food  for 2 weeks, can take with tylenol, can not take with aleve, iburpofen, then as needed daily for pain .  traMADol (ULTRAM) 50 MG tablet, TAKE 1 TO 2 TABLETS BY MOUTH TWICE DAILY AS NEEDED FOR MODERATE PAIN   Current Outpatient Medications (Hematological):  Marland Kitchen  Cyanocobalamin 1000 MCG/15ML LIQD, Take by mouth daily. .  IRON PO, Take by mouth daily. Reported on 05/28/2015   Current Outpatient Medications (Other):  Marland Kitchen  ALPRAZolam (XANAX) 0.5 MG tablet, TAKE 1/2 TO 1 TABLET BY MOUTH 2 TO 3 TIMES A DAY AS NEEDED FOR ANXIETY OR PANIC .  amitriptyline (ELAVIL) 10 MG tablet, TAKE ONE OR TWO TABLETS BY MOUTH EVERY NIGHT AT BEDTIME AS NEEDED FOR SLEEP .  cyclobenzaprine (FLEXERIL) 10 MG tablet, TAKE ONE TABLET BY MOUTH TWICE A DAY AS NEEDED FOR MUSCLE SPASMS .  escitalopram (LEXAPRO) 20 MG tablet, TAKE ONE TABLET BY MOUTH DAILY FOR MOOD .  fluconazole (DIFLUCAN) 150 MG tablet, TAKE ONE TABLET BY MOUTH DAILY .  gabapentin (NEURONTIN) 300 MG capsule, Take 1 capsule (300 mg total) by mouth 3 (three) times daily. Marland Kitchen  MAGNESIUM PO, Take by mouth as needed. Reported on 06/27/2015 .  ondansetron (ZOFRAN) 4 MG tablet, Take 1 tablet (4 mg total) by mouth daily as needed for nausea or vomiting (Not sedating). Marland Kitchen  OVER THE COUNTER MEDICATION,  .  valACYclovir (VALTREX) 500 MG tablet, Take 1 to  2 tablets Daily Prophylaxis for Fever Blisters  / Cold Sores .  Vitamin D, Ergocalciferol, (DRISDOL) 1.25 MG (50000 UT) CAPS capsule, Take 1 capsule 3 x  /week for severe Vitamin D  Deficiency (Patient taking differently: Take 1 capsule 1 x  /week for severe Vitamin D  Deficiency)    Reviewed prior external information including notes and imaging from  primary care provider As well as notes that were available from care everywhere and other healthcare systems.  Past medical history, social, surgical and family history all reviewed in electronic medical record.  No pertanent information unless stated regarding to the chief  complaint.   Review of Systems:  No headache, visual changes, nausea, vomiting, diarrhea, constipation, dizziness, abdominal pain, skin rash, fevers, chills, night sweats, weight loss, swollen lymph nodes, body aches, joint swelling, chest pain, shortness of breath, mood changes. POSITIVE muscle aches  Objective  Blood pressure 140/90, pulse 71, height 5\' 5"  (1.651 m), weight 265 lb (120.2 kg), SpO2 98 %.   General: No apparent distress alert and oriented x3 mood and affect normal, dressed appropriately.  HEENT: Pupils equal, extraocular movements intact  Respiratory: Patient's speak in full sentences and does not appear short of breath  Cardiovascular: No lower extremity edema, non tender, no erythema  Neuro: Cranial nerves II through XII are intact, neurovascularly intact in all extremities with 2+ DTRs and 2+ pulses.  Gait normal with good balance and coordination.  MSK left knee exam shows the patient does have some mild varus deformity noted of the knee.  Mild instability with valgus and varus force.  More pain over the Pez anserine area.  No significant swelling.  Full flexion extension noted. Patient's back no does have significant tightness with any straight leg test.  Limited musculoskeletal ultrasound was performed and interpreted by Lyndal Pulley  Limited ultrasound of patient's left knee shows that there has been some progression with some moderate arthritic changes of the medial joint space.  Meniscus appears to be fairly unremarkable.  Moderate narrowing of the patellofemoral joint.  Patient does have swelling noted of the Pes anserine area. Impression: Pez anserine bursitis   Impression and Recommendations:     The above documentation has been reviewed and is accurate and complete Lyndal Pulley, DO

## 2019-11-30 NOTE — Assessment & Plan Note (Signed)
Chronic problem with exacerbation.  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  I believe the right is more secondary to the hamstring, discussed compression, icing, topical anti-inflammatories.  Patient is having also a back issue in seeing another provider and encouraged her to continue to follow-up with that.  Patient will increase activity slowly.  Follow-up with me again 6 weeks.

## 2019-11-30 NOTE — Patient Instructions (Addendum)
Voltaren 2x a day Ice 20 minutes 2x a day Body Helix I agree with the back See me again in 6 weeks

## 2019-12-03 ENCOUNTER — Encounter: Payer: Self-pay | Admitting: Family Medicine

## 2019-12-04 ENCOUNTER — Ambulatory Visit (INDEPENDENT_AMBULATORY_CARE_PROVIDER_SITE_OTHER): Payer: 59

## 2019-12-04 ENCOUNTER — Other Ambulatory Visit: Payer: Self-pay

## 2019-12-04 DIAGNOSIS — M25562 Pain in left knee: Secondary | ICD-10-CM

## 2019-12-04 DIAGNOSIS — G8929 Other chronic pain: Secondary | ICD-10-CM

## 2019-12-06 ENCOUNTER — Ambulatory Visit: Payer: 59 | Admitting: Gastroenterology

## 2019-12-12 ENCOUNTER — Other Ambulatory Visit: Payer: Self-pay | Admitting: Adult Health Nurse Practitioner

## 2019-12-12 ENCOUNTER — Encounter: Payer: Self-pay | Admitting: Adult Health Nurse Practitioner

## 2019-12-12 DIAGNOSIS — F411 Generalized anxiety disorder: Secondary | ICD-10-CM

## 2019-12-12 MED ORDER — ALPRAZOLAM 0.5 MG PO TABS: ORAL_TABLET | ORAL | 0 refills | Status: DC

## 2019-12-29 ENCOUNTER — Other Ambulatory Visit: Payer: Self-pay | Admitting: Adult Health

## 2019-12-29 ENCOUNTER — Other Ambulatory Visit: Payer: Self-pay | Admitting: Internal Medicine

## 2019-12-29 DIAGNOSIS — E782 Mixed hyperlipidemia: Secondary | ICD-10-CM

## 2020-01-11 ENCOUNTER — Ambulatory Visit: Payer: 59 | Admitting: Family Medicine

## 2020-02-01 ENCOUNTER — Ambulatory Visit: Payer: 59 | Admitting: Gastroenterology

## 2020-02-14 ENCOUNTER — Other Ambulatory Visit: Payer: Self-pay | Admitting: Adult Health Nurse Practitioner

## 2020-02-14 DIAGNOSIS — F411 Generalized anxiety disorder: Secondary | ICD-10-CM

## 2020-02-26 ENCOUNTER — Other Ambulatory Visit: Payer: Self-pay | Admitting: Adult Health Nurse Practitioner

## 2020-02-26 ENCOUNTER — Other Ambulatory Visit: Payer: Self-pay | Admitting: Internal Medicine

## 2020-02-26 DIAGNOSIS — F411 Generalized anxiety disorder: Secondary | ICD-10-CM

## 2020-02-28 ENCOUNTER — Other Ambulatory Visit: Payer: Self-pay

## 2020-02-28 ENCOUNTER — Encounter: Payer: Self-pay | Admitting: Adult Health Nurse Practitioner

## 2020-02-28 ENCOUNTER — Ambulatory Visit (INDEPENDENT_AMBULATORY_CARE_PROVIDER_SITE_OTHER): Payer: 59 | Admitting: Adult Health Nurse Practitioner

## 2020-02-28 VITALS — BP 130/80 | HR 103 | Temp 97.7°F | Wt 271.0 lb

## 2020-02-28 DIAGNOSIS — E559 Vitamin D deficiency, unspecified: Secondary | ICD-10-CM

## 2020-02-28 DIAGNOSIS — I10 Essential (primary) hypertension: Secondary | ICD-10-CM

## 2020-02-28 DIAGNOSIS — B373 Candidiasis of vulva and vagina: Secondary | ICD-10-CM | POA: Diagnosis not present

## 2020-02-28 DIAGNOSIS — Z6841 Body Mass Index (BMI) 40.0 and over, adult: Secondary | ICD-10-CM

## 2020-02-28 DIAGNOSIS — E782 Mixed hyperlipidemia: Secondary | ICD-10-CM | POA: Diagnosis not present

## 2020-02-28 DIAGNOSIS — F3341 Major depressive disorder, recurrent, in partial remission: Secondary | ICD-10-CM

## 2020-02-28 DIAGNOSIS — B3731 Acute candidiasis of vulva and vagina: Secondary | ICD-10-CM

## 2020-02-28 DIAGNOSIS — H8113 Benign paroxysmal vertigo, bilateral: Secondary | ICD-10-CM

## 2020-02-28 DIAGNOSIS — F411 Generalized anxiety disorder: Secondary | ICD-10-CM

## 2020-02-28 LAB — CBC WITH DIFFERENTIAL/PLATELET
Absolute Monocytes: 544 cells/uL (ref 200–950)
Basophils Absolute: 68 cells/uL (ref 0–200)
Basophils Relative: 0.8 %
Eosinophils Absolute: 119 cells/uL (ref 15–500)
Eosinophils Relative: 1.4 %
HCT: 40.2 % (ref 35.0–45.0)
Hemoglobin: 13.3 g/dL (ref 11.7–15.5)
Lymphs Abs: 1573 cells/uL (ref 850–3900)
MCH: 29 pg (ref 27.0–33.0)
MCHC: 33.1 g/dL (ref 32.0–36.0)
MCV: 87.6 fL (ref 80.0–100.0)
MPV: 11.3 fL (ref 7.5–12.5)
Monocytes Relative: 6.4 %
Neutro Abs: 6197 cells/uL (ref 1500–7800)
Neutrophils Relative %: 72.9 %
Platelets: 266 10*3/uL (ref 140–400)
RBC: 4.59 10*6/uL (ref 3.80–5.10)
RDW: 13 % (ref 11.0–15.0)
Total Lymphocyte: 18.5 %
WBC: 8.5 10*3/uL (ref 3.8–10.8)

## 2020-02-28 LAB — COMPLETE METABOLIC PANEL WITH GFR
AG Ratio: 1.6 (calc) (ref 1.0–2.5)
ALT: 18 U/L (ref 6–29)
AST: 16 U/L (ref 10–35)
Albumin: 4.6 g/dL (ref 3.6–5.1)
Alkaline phosphatase (APISO): 92 U/L (ref 37–153)
BUN: 13 mg/dL (ref 7–25)
CO2: 26 mmol/L (ref 20–32)
Calcium: 9.4 mg/dL (ref 8.6–10.4)
Chloride: 106 mmol/L (ref 98–110)
Creat: 0.94 mg/dL (ref 0.50–1.05)
GFR, Est African American: 80 mL/min/{1.73_m2} (ref >=60)
GFR, Est Non African American: 69 mL/min/{1.73_m2} (ref >=60)
Globulin: 2.8 g/dL (calc) (ref 1.9–3.7)
Glucose, Bld: 94 mg/dL (ref 65–99)
Potassium: 4.6 mmol/L (ref 3.5–5.3)
Sodium: 140 mmol/L (ref 135–146)
Total Bilirubin: 0.6 mg/dL (ref 0.2–1.2)
Total Protein: 7.4 g/dL (ref 6.1–8.1)

## 2020-02-28 LAB — LIPID PANEL
Cholesterol: 185 mg/dL (ref <200)
HDL: 56 mg/dL (ref >=50)
LDL Cholesterol (Calc): 98 mg/dL (calc)
Non-HDL Cholesterol (Calc): 129 mg/dL (calc) (ref <130)
Total CHOL/HDL Ratio: 3.3 (calc) (ref <5.0)
Triglycerides: 211 mg/dL — ABNORMAL HIGH (ref <150)

## 2020-02-28 LAB — VITAMIN D 25 HYDROXY (VIT D DEFICIENCY, FRACTURES): Vit D, 25-Hydroxy: 73 ng/mL (ref 30–100)

## 2020-02-28 MED ORDER — AMLODIPINE BESYLATE 5 MG PO TABS: 5.0000 mg | ORAL_TABLET | Freq: Every day | ORAL | 11 refills | Status: DC

## 2020-02-28 MED ORDER — FLUCONAZOLE 150 MG PO TABS: ORAL_TABLET | ORAL | 0 refills | Status: DC

## 2020-02-28 NOTE — Progress Notes (Signed)
FOLLOW UP 3 MONTH  Assessment and Plan:  Wendy Levine was seen today for follow-up and hypertension.  Diagnoses and all orders for this visit:  Essential hypertension Continue to monitor blood pressure at home discussed goal for patient -     CBC with Differential/Platelet -     COMPLETE METABOLIC PANEL WITH GFR -     amLODipine (NORVASC) 5 MG tablet; Take 1 tablet (5 mg total) by mouth daily.  Hyperlipidemia, mixed Continue medications: Rosuvastatin Discussed dietary and exercise modifications Low fat diet -     Lipid panel  Vitamin D deficiency Continue supplementation to maintain goal of 70-100 Taking Vitamin D50,000IU three times a week IU daily -     VITAMIN D 25 Hydroxy (Vit-D Deficiency, Fractures)  Vaginal candida -     fluconazole (DIFLUCAN) 150 MG tablet; Take one tablet at onset of symptoms and second tablet next day.  Use as needed.  Benign paroxysmal positional vertigo due to bilateral vestibular disorder Discussed Epley manuver, Pt instructions provided -add Zyrtec?  Anxiety state Doing well on current regiment Continue  Discussed stress management techniques   Discussed good sleep hygiene Discussed increasing physical activity and exercise Increase water intake  Depression, major, recurrent, in partial remission (HCC) Continue medications:  Discussed stress management techniques  Discussed, increase water,intake & good sleep hygiene  Discussed increasing exercise & vegetables in diet  Morbid Obesity with BMI 45.0-49.9 Discussed dietary and exercise modifications Rx Wegovy 0.25mg  first month Discussed medication and side effects, titration of medication. Patient has tied Phentermine, unable to tolerate palpitations also tried topamax with out weight loss.   Continue diet and meds as discussed. Further disposition pending results of labs.  Further disposition pending results if labs check today. Discussed med's effects and SE's.   Over 30 minutes of face  to face interview, exam, counseling, chart review, and critical decision making was performed.    Future Appointments  Date Time Provider [place]  02/28/2020 11:30 AM Garnet Sierras, NP [facility] None  03/27/2020  9:20 AM Armbruster, Carlota Raspberry, MD [facility]  05/06/2020 10:00 AM Garnet Sierras, NP [facility] None     HPI 55 y.o. female  presents for CPE and 3 month follow up on hypertension, cholesterol, prediabetes, obesity, and vitamin D deficiency.   Her blood pressure has been controlled at home , today their BP is       She is not on xanax anymore, her anxiety has improved but she is not sleeping until 4 AM. She has decreased her lexapro to 10 mg.   She has right shoulder pain, very specific anterior shooting shoulder pain. She follows with [facility], Dr. Tamala Julian. States feels better after good neck massage. She has not taken the mobic, has it at home. She has tried for her back and right shoulder, advil, tylenol arthritis and 1/2 an expired percocet. She will have lower back pain, pain in bilateral glutes, worse at night with lying on that side. No pain down her legs. Patient denies fever, hematuria, incontinence, numbness, tingling, weakness and saddle anesthesia Follows with [facility]  Normal AB [place] 08/2018 DG lumbar showed L5-S1 disc space narrowing, mild L4-5 facet degenerative charnge with small bowel loop thickened folds.   She will use linzess AS needed for constipation and that helps.    She does not workout. She denies chest pain, dizziness, SOB.  She is on cholesterol medication, crestor 10mg  2-3 X A WEEK and denies myalgias. Her cholesterol is at goal. The cholesterol  last visit was:   Lab Results  Component Value Date   CHOL 170 04/06/2019   HDL 52 04/06/2019   LDLCALC 92 04/06/2019   TRIG 162 (H) 04/06/2019   CHOLHDL 3.3 04/06/2019    She has been working on diet and exercise for prediabetes with insulin resistance due to obesity, and  denies paresthesia of the feet, polydipsia, polyuria and visual disturbances. Last A1C in the office was:  Lab Results  Component Value Date   HGBA1C 5.4 04/06/2019   Patient is on Vitamin D supplement.   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 Audie Box BMI is There is no height or weight on file to calculate BMI., she is working on diet and exercise. She has OSA and is on CPAP.  Wt Readings from Last 3 Encounters:  11/30/19 265 lb (120.2 kg)  09/22/19 (!) 263 lb (119.3 kg)  05/17/19 255 lb (115.7 kg)    Current Medications:   Current Outpatient Medications (Endocrine & Metabolic):  .  etonogestrel (NEXPLANON) 68 MG IMPL implant, 1 each by Subdermal route once.  Current Facility-Administered Medications (Endocrine & Metabolic):  .  dexamethasone (DECADRON) injection 10 mg  Current Outpatient Medications (Cardiovascular):  .  furosemide (LASIX) 40 MG tablet, Take 1 tablet Daily for BP & Fluid Retention / Ankle Swelling .  rosuvastatin (CRESTOR) 10 MG tablet, TAKE ONE TABLET BY MOUTH DAILY FOR CHOLESTEROL     Current Outpatient Medications (Analgesics):  .  meloxicam (MOBIC) 15 MG tablet, Take one daily with food for 2 weeks, can take with tylenol, can not take with aleve, iburpofen, then as needed daily for pain .  traMADol (ULTRAM) 50 MG tablet, TAKE 1 TO 2 TABLETS BY MOUTH TWICE DAILY AS NEEDED FOR MODERATE PAIN   Current Outpatient Medications (Hematological):  Marland Kitchen  Cyanocobalamin 1000 MCG/15ML LIQD, Take by mouth daily. .  IRON PO, Take by mouth daily. Reported on 05/28/2015   Current Outpatient Medications (Other):  Marland Kitchen  ALPRAZolam (XANAX) 0.5 MG tablet, TAKE 1/2 TO 1 TABLET BY MOUTH TWO TO THREE TIMES A DAY AS NEEDED FOR ANXIETY OR PANIC .  amitriptyline (ELAVIL) 10 MG tablet, TAKE ONE OR TWO TABLETS BY MOUTH EVERY NIGHT AT BEDTIME AS NEEDED  FOR SLEEP .  cyclobenzaprine (FLEXERIL) 10 MG tablet, TAKE ONE TABLET BY MOUTH TWICE A DAY AS NEEDED FOR MUSCLE SPASMS .  escitalopram (LEXAPRO) 20 MG tablet, TAKE ONE TABLET BY MOUTH DAILY FOR MOOD .  fluconazole (DIFLUCAN) 150 MG tablet, TAKE ONE TABLET BY MOUTH DAILY .  gabapentin (NEURONTIN) 300 MG capsule, Take 1 capsule (300 mg total) by mouth 3 (three) times daily. Marland Kitchen  MAGNESIUM PO, Take by mouth as needed. Reported on 06/27/2015 .  ondansetron (ZOFRAN) 4 MG tablet, Take 1 tablet (4 mg total) by mouth daily as needed for nausea or vomiting (Not sedating). Marland Kitchen  OVER THE COUNTER MEDICATION,  .  valACYclovir (VALTREX) 500 MG tablet, TAKE ONE TO TWO TABLETS BY MOUTH DAILY FOR FEVER BLISTER PROPHYLAXIS .  Vitamin D, Ergocalciferol, (DRISDOL) 1.25 MG (50000 UT) CAPS capsule, Take 1 capsule 3 x  /week for severe Vitamin D  Deficiency (Patient taking differently: Take 1 capsule 1 x  /week for severe Vitamin D  Deficiency)   Medical History:  Past Medical History:  Diagnosis Date  . Allergy   .  Anemia   . Anxiety   . ASCUS (atypical squamous cells of undetermined significance) on Pap smear    neg HR HPV 09/2008, ascus 06/2009,10/2009  . Fatty liver 08/2018   ultrasound  . HSV-2 infection   . Hypercholesteremia   . Hypertension   . OSA on CPAP   . Pes planus of both feet 06/20/2013  . Prediabetes   . Sleep apnea    Health Maintenance Immunization History  Administered Date(s) Administered  . Influenza Inj Mdck Quad With Preservative 12/01/2017  . Influenza Split 02/02/2011  . Influenza,inj,Quad PF,6+ Mos 11/21/2012, 11/10/2013, 11/14/2014  . PFIZER SARS-COV-2 Vaccination 04/30/2019, 05/30/2019  . Td 06/23/2005  . Tdap 07/09/2015    Tetanus: 2017 Pneumovax: N/A Prevnar 13: N/A Flu vaccine: 2020 today Zostavax: N/A  Pap: 09/2016 normal, Dr. Marvel Plan MGM: 01/2018 DEXA: 08/2014 Colonoscopy: 06/2016 Normal Dr. Havery Moros due 5 years Hpylori breath test 2016  negative EGD:N/A Echo 2007 PFT 06/2015 Last Eye Exam: Dr. Clydene Laming Dentist: Dr. Jake Seats Orthodontist- got braces Dr. Ulyess Blossom  Patient Care Team: Unk Pinto, MD as PCP - General (Internal Medicine) Berenice Primas, MD as Referring Physician (Orthopedic Surgery) Lelon Perla, MD as Consulting Physician (Cardiology) Fontaine, Belinda Block, MD (Inactive) as Consulting Physician (Gynecology) Rolm Bookbinder, MD as Consulting Physician (Dermatology) Tania Ade, MD as Consulting Physician (Orthopedic Surgery)   Allergies:  Allergies  Allergen Reactions  . Doxycycline Hives and Shortness Of Breath  . Ace Inhibitors     cough  . Adhesive [Tape]   . Augmentin [Amoxicillin-Pot Clavulanate] Nausea Only    diarrhea  . Clindamycin/Lincomycin   . Epinephrine-Lidocaine-Na Metabisulfite [Lidocaine-Epinephrine]     Has heart palpitations from the epi, wants without  . Levaquin [Levofloxacin] Other (See Comments)    Dysphoria  . Vicodin [Hydrocodone-Acetaminophen] Itching    SURGICAL HISTORY She  has a past surgical history that includes Cesarean section; remove moles; Colposcopy; Knee surgery; Mouth surgery; Nexplanon insertion (05/22/2015); and Colonoscopy (2005?). FAMILY HISTORY Her family history includes Breast cancer (age of onset: 30) in her maternal grandmother; Cancer in her mother; Emphysema in her maternal grandmother; Hyperlipidemia in her mother; Hypertension in her maternal grandmother and mother. SOCIAL HISTORY She  reports that she quit smoking about 20 years ago. Her smoking use included cigarettes. She has a 3.00 pack-year smoking history. She has never used smokeless tobacco. She reports current alcohol use of about 11.0 - 15.0 standard drinks of alcohol per week. She reports that she does not use drugs.  Review of Systems:  Review of Systems  Constitutional: Positive for malaise/fatigue. Negative for chills, diaphoresis, fever and weight loss.  HENT: Negative.    Eyes: Negative.   Respiratory: Negative for cough, hemoptysis, sputum production, shortness of breath and wheezing.   Cardiovascular: Negative.  Negative for chest pain, palpitations, orthopnea, claudication, leg swelling and PND.  Gastrointestinal: Positive for constipation.  Genitourinary: Negative.   Musculoskeletal: Positive for back pain, joint pain and neck pain. Negative for falls and myalgias.  Skin: Negative.   Neurological: Negative.  Negative for weakness.  Psychiatric/Behavioral: Negative for depression, hallucinations, memory loss, substance abuse and suicidal ideas. The patient has insomnia. The patient is not nervous/anxious.    Physical Exam: There were no vitals taken for this visit. Wt Readings from Last 3 Encounters:  11/30/19 265 lb (120.2 kg)  09/22/19 (!) 263 lb (119.3 kg)  05/17/19 255 lb (115.7 kg)   General Appearance: Well nourished, in no apparent distress. Eyes: PERRLA, EOMs, conjunctiva no swelling or erythema  Sinuses: No Frontal/maxillary tenderness ENT/Mouth: Ext aud canals clear, TMs without erythema, bulging. Mouth and nose not examined- patient wearing a facemask. Hearing normal.  Neck: Supple, thyroid normal.  Respiratory: Respiratory effort normal, BS equal bilaterally without rales, rhonchi, wheezing or stridor.  Cardio: RRR with no MRGs. Brisk peripheral pulses without edema.  Abdomen: Soft, + BS,  + epigastric tenderness, no guarding, rebound, hernias, masses. Lymphatics: Non tender without lymphadenopathy.  Musculoskeletal: Full ROM, 5/5 strength, Normal gait, Patient is able to ambulate well. Gait is not  Antalgic. Straight leg raising with dorsiflexion negative bilaterally for radicular symptoms. Sensory exam in the legs are normal. Knee reflexes are UNABLE TO GET BILATERALLY Ankle reflexes are normal Strength is normal and symmetric in arms and legs. There is SI tenderness on the left to palpation.  There is paraspinal muscle spasm.  There is  midline tenderness.  ROM of spine with  limited in all spheres due to pain.  Skin: Warm, dry without rashes, lesions, ecchymosis. + left hip greater trocanteric tenderness Neuro: Cranial nerves intact. Normal muscle tone, no cerebellar symptoms. Psych: Awake and oriented X 3, normal affect, Insight and Judgment appropriate.    Garnet Sierras, NP 8:41 AM [facility]

## 2020-02-29 ENCOUNTER — Encounter: Payer: BC Managed Care – PPO | Admitting: Physician Assistant

## 2020-03-04 NOTE — Patient Instructions (Signed)
Benign Paroxysmal Positional Vertigo (BPPV)  General Information In Benign Paroxysmal Positional Vertigo (BPPV) dizziness is generally thought to be due to debris which has collected within a part of the inner ear. This debris can be thought of as "ear rocks", although the formal name is "otoconia". Ear rocks are small crystals of calcium carbonate derived from a structure in the ear called the "utricle" (figure to the right ). The symptoms of BPPV include dizziness or vertigo, lightheadedness, imbalance, and nausea. Activities which bring on symptoms will vary among persons, but symptoms are usually followed by a change of position of the head like getting out of bed or rolling over in bed are common "problem" motions .  However if you have worsening HA, changes vision/speech, weakness go to the ER     What can be done? 1) Use two or more pillows at night. Avoid sleeping on the "bad" side. In the morning, get up slowly and sit on the edge of the bed for a minute. 2) Medication prescribed by your doctor.  3) The exercises below, you can do at home to help you prevent the sensation later in the day.  4) We can refer you to physical therapy at [facility], # [phone]  Home treatments: The Brandt-Daroff Exercises are a home method of treating BPPV,and are effective 95% of the time.  These exercises are performed in three sets per day for two weeks. In each set, one performs the maneuver as shown five times. Start sitting upright (position 1). Then move into the side-lying position (position 2), with the head angled upward about halfway. An easy way to remember this is to imagine someone standing about 6 feet in front of you, and just keep looking at their head at all times. Stay in the side-lying position for 30 seconds, or until the dizziness subsides if this is longer, then go back to the sitting position (position 3). Stay there for 30 seconds, and then go to the opposite side  (position 4) and follow the same routine.  At home Epley Maneuver This procedure seems to be even more effective than the in-office procedure, perhaps because it is repeated every night for a week.  The method (for the left side) is performed as shown on the figure below.  1) One stays in each of the supine (lying down) positions for 30 seconds, and in the sitting upright position (top) for 1 minute.  2) Thus, once cycle takes 2 1/2 minutes.  3) Typically 3 cycles are performed just prior to going to sleep.  4) It is best to do them at night rather than in the morning or midday, as if one becomes dizzy following the exercises, then it can resolve while one is sleeping.  The mirror image of this procedure is used for the right ear.  

## 2020-03-09 MED ORDER — WEGOVY 0.5 MG/0.5ML ~~LOC~~ SOAJ: 0.5000 mg | SUBCUTANEOUS | 0 refills | Status: DC

## 2020-03-09 MED ORDER — WEGOVY 0.25 MG/0.5ML ~~LOC~~ SOAJ: 0.2500 mg | SUBCUTANEOUS | 0 refills | Status: DC

## 2020-03-10 DIAGNOSIS — Z6841 Body Mass Index (BMI) 40.0 and over, adult: Secondary | ICD-10-CM

## 2020-03-10 MED ORDER — WEGOVY 0.25 MG/0.5ML ~~LOC~~ SOAJ: 0.2500 mg | SUBCUTANEOUS | 0 refills | Status: DC

## 2020-03-10 MED ORDER — WEGOVY 0.5 MG/0.5ML ~~LOC~~ SOAJ
0.5000 mg | SUBCUTANEOUS | 0 refills | Status: DC
Start: 2020-03-10 — End: 2020-04-30

## 2020-03-12 ENCOUNTER — Ambulatory Visit: Payer: 59 | Admitting: Adult Health Nurse Practitioner

## 2020-03-15 ENCOUNTER — Other Ambulatory Visit: Payer: Self-pay | Admitting: Adult Health Nurse Practitioner

## 2020-03-15 MED ORDER — TRAZODONE HCL 100 MG PO TABS: 100.0000 mg | ORAL_TABLET | Freq: Every day | ORAL | 1 refills | Status: DC

## 2020-03-16 ENCOUNTER — Other Ambulatory Visit: Payer: Self-pay | Admitting: Internal Medicine

## 2020-03-16 DIAGNOSIS — E782 Mixed hyperlipidemia: Secondary | ICD-10-CM

## 2020-03-16 MED ORDER — ROSUVASTATIN CALCIUM 10 MG PO TABS: ORAL_TABLET | ORAL | 0 refills | Status: DC

## 2020-03-26 ENCOUNTER — Encounter: Payer: Self-pay | Admitting: Adult Health Nurse Practitioner

## 2020-03-26 ENCOUNTER — Ambulatory Visit: Payer: 59 | Admitting: Adult Health Nurse Practitioner

## 2020-03-26 ENCOUNTER — Other Ambulatory Visit: Payer: Self-pay

## 2020-03-26 VITALS — BP 136/80 | HR 63 | Temp 97.3°F | Wt 273.0 lb

## 2020-03-26 DIAGNOSIS — Z6841 Body Mass Index (BMI) 40.0 and over, adult: Secondary | ICD-10-CM

## 2020-03-26 DIAGNOSIS — B373 Candidiasis of vulva and vagina: Secondary | ICD-10-CM

## 2020-03-26 DIAGNOSIS — E782 Mixed hyperlipidemia: Secondary | ICD-10-CM | POA: Diagnosis not present

## 2020-03-26 DIAGNOSIS — F3341 Major depressive disorder, recurrent, in partial remission: Secondary | ICD-10-CM

## 2020-03-26 DIAGNOSIS — F411 Generalized anxiety disorder: Secondary | ICD-10-CM | POA: Diagnosis not present

## 2020-03-26 DIAGNOSIS — E559 Vitamin D deficiency, unspecified: Secondary | ICD-10-CM

## 2020-03-26 DIAGNOSIS — I10 Essential (primary) hypertension: Secondary | ICD-10-CM

## 2020-03-26 DIAGNOSIS — B3731 Acute candidiasis of vulva and vagina: Secondary | ICD-10-CM

## 2020-03-27 ENCOUNTER — Ambulatory Visit: Payer: 59 | Admitting: Gastroenterology

## 2020-03-27 ENCOUNTER — Encounter: Payer: Self-pay | Admitting: Gastroenterology

## 2020-03-27 VITALS — BP 130/70 | HR 85 | Ht 65.0 in | Wt 276.0 lb

## 2020-03-27 DIAGNOSIS — K760 Fatty (change of) liver, not elsewhere classified: Secondary | ICD-10-CM

## 2020-03-27 DIAGNOSIS — R0789 Other chest pain: Secondary | ICD-10-CM | POA: Diagnosis not present

## 2020-03-27 NOTE — Patient Instructions (Addendum)
If you are age 54 or older, your body mass index should be between 23-30. Your Body mass index is 45.93 kg/m. If this is out of the aforementioned range listed, please consider follow up with your Primary Care Provider.  If you are age 72 or younger, your body mass index should be between 19-25. Your Body mass index is 45.93 kg/m. If this is out of the aformentioned range listed, please consider follow up with your Primary Care Provider.   Discontinue probiotic.  Please let me know if you have a recurrence of your symptoms.   Thank you for entrusting me with your care and for choosing [facility], Dr. [place] Cellar

## 2020-03-27 NOTE — Progress Notes (Signed)
HPI :  55 year old female with history of colon polyps, fatty liver, hypertension, here for a new patient visit to reestablish care, for symptoms of atypical chest pain.  I last saw her for colonoscopy in May 2018 at which time she had 2 sessile serrated polyps/adenomas removed.  She presents today with symptoms of chest discomfort that of been ongoing for the past 3 years or so.  She states it happened about 9 times total where she will sporadically feel discomfort in her mid chest that can radiate into her jaw and rarely up into her ear.  This tends to last a few minutes on its own before it passes.  She tries to take a deep breath to relax.  She states it feels as though his air bubble is stuck in her esophagus when this occurs.  She states she has tried to belch when this happens and sometimes can and sometimes cannot.  She denies any shortness of breath of this or radiation to her arms at all.  She generally denies any reflux symptoms or pyrosis that bothered her.  She was given a trial of Prilosec empirically by her primary care and states she continued to have episodes while on this.  She denies any nausea or vomiting otherwise.  No postprandial abdominal pains.  No dysphagia.  No clear triggers in regards to when the symptoms can occur.  She states about a year ago she thought she had an episode of food poisoning and had some altered bowels for a while.  A friend advised her to take a probiotic and she has been taking a pre-/probiotic for the past year.  She states since being on this regimen she has not had any further episodes.  She wonders why that would happen.  She states she was having some gas and bloating and loose stools at the time she started the probiotic and that appears to be better.  She denies any coronary artery disease in her family, no history of heart attacks.  She has never had a prior stress test.  She denies any exertional symptoms as is quite active.  Of note on review of  her imaging she does have a history of fatty liver.  She does drink alcohol about 3 alcoholic beverages per day.  No known history of cirrhosis.  Her LFTs have historically been normal.  She is on Wegovy right now to help with weight loss. BMI 45.93 today.   She has never had a prior EGD.  Colonoscopy 07/08/2016 - The perianal and digital rectal examinations were normal. - A 8 mm polyp was found in the ascending colon. The polyp was flat. The polyp was removed with a cold snare. Resection and retrieval were complete. - A 5 mm polyp was found in the hepatic flexure. The polyp was sessile. The polyp was removed with a cold snare. Resection and retrieval were complete. - Internal hemorrhoids were found during retroflexion. - There was significant spasm at the hepatic flexure and in the ascending colon during polypectomy, which prolonged the procedure. - The exam was otherwise without abnormality.  Surgical [P], hepatic flexure x1 and ascending x1, polyps (2) - SESSILE SERRATED POLYP/ADENOMA. - NO DYSPLASIA OR MALIGNANCY.   US abdomen 09/01/18: IMPRESSION: 1. Increase in liver echogenicity, a finding indicative of hepatic steatosis. While no focal liver lesions are evident on this study, it must be cautioned that the sensitivity of ultrasound for detection of focal liver lesions is diminished in this circumstance.  2.  Focal scar mid right kidney.  3.  Study otherwise unremarkable.   Past Medical History:  Diagnosis Date  . Allergy   . Anemia   . Anxiety   . ASCUS (atypical squamous cells of undetermined significance) on Pap smear    neg HR HPV 09/2008, ascus 06/2009,10/2009  . Fatty liver 08/2018   ultrasound  . Fatty liver   . HSV-2 infection   . Hypercholesteremia   . Hypertension   . OSA on CPAP   . Pes planus of both feet 06/20/2013  . Prediabetes   . Sleep apnea      Past Surgical History:  Procedure Laterality Date  . CESAREAN SECTION    . COLONOSCOPY  2005?   in  [place] -"normal"  . COLPOSCOPY    . KNEE SURGERY     Arthroscopic  . MOUTH SURGERY    . Nexplanon insertion  05/22/2015  . remove moles     Family History  Problem Relation Age of Onset  . Hypertension Maternal Grandmother   . Breast cancer Maternal Grandmother 80  . Emphysema Maternal Grandmother   . Hypertension Mother   . Hyperlipidemia Mother   . Cancer Mother        Skin  . Other Father        this is her step-dad  . Colon cancer Neg Hx    Social History   Tobacco Use  . Smoking status: Former Smoker    Packs/day: 0.30    Years: 10.00    Pack years: 3.00    Types: Cigarettes    Quit date: 06/08/1999    Years since quitting: 20.8  . Smokeless tobacco: Never Used  Vaping Use  . Vaping Use: Never used  Substance Use Topics  . Alcohol use: Yes    Alcohol/week: 11.0 - 15.0 standard drinks    Types: 4 - 8 Glasses of wine, 7 Standard drinks or equivalent per week  . Drug use: No   Current Outpatient Medications  Medication Sig Dispense Refill  . ALPRAZolam (XANAX) 0.5 MG tablet TAKE 1/2 TO 1 TABLET BY MOUTH TWO TO THREE TIMES A DAY AS NEEDED FOR ANXIETY OR PANIC 30 tablet 0  . amLODipine (NORVASC) 5 MG tablet Take 1 tablet (5 mg total) by mouth daily. 30 tablet 11  . escitalopram (LEXAPRO) 20 MG tablet TAKE ONE TABLET BY MOUTH DAILY FOR MOOD 90 tablet 1  . etonogestrel (NEXPLANON) 68 MG IMPL implant 1 each by Subdermal route once.    . fluconazole (DIFLUCAN) 150 MG tablet Take one tablet at onset of symptoms and second tablet next day.  Use as needed. 10 tablet 0  . MAGNESIUM PO Take by mouth as needed. Reported on 06/27/2015    . rosuvastatin (CRESTOR) 10 MG tablet Take   1 tablet   Daily for Cholesterol 90 tablet 0  . Semaglutide-Weight Management (WEGOVY) 0.25 MG/0.5ML SOAJ Inject 0.25 mg into the skin once a week. 2 mL 0  . Semaglutide-Weight Management (WEGOVY) 0.5 MG/0.5ML SOAJ Inject 0.5 mg into the skin once a week. To be filled after 0.25mg  2 mL 0  .  traZODone (DESYREL) 100 MG tablet Take 1 tablet (100 mg total) by mouth at bedtime. 30 tablet 1  . valACYclovir (VALTREX) 500 MG tablet TAKE ONE TO TWO TABLETS BY MOUTH DAILY FOR FEVER BLISTER PROPHYLAXIS 180 tablet 0  . Vitamin D, Ergocalciferol, (DRISDOL) 1.25 MG (50000 UT) CAPS capsule Take 1 capsule 3 x  /week for severe Vitamin D  Deficiency (Patient taking differently: Take 1 capsule 1 x  /week for severe Vitamin D  Deficiency) 36 capsule 3   No current facility-administered medications for this visit.   Allergies  Allergen Reactions  . Doxycycline Hives and Shortness Of Breath  . Ace Inhibitors     cough  . Adhesive [Tape]   . Augmentin [Amoxicillin-Pot Clavulanate] Nausea Only    diarrhea  . Clindamycin/Lincomycin   . Epinephrine-Lidocaine-Na Metabisulfite [Lidocaine-Epinephrine]     Has heart palpitations from the epi, wants without  . Levaquin [Levofloxacin] Other (See Comments)    Dysphoria  . Vicodin [Hydrocodone-Acetaminophen] Itching     Review of Systems: All systems reviewed and negative except where noted in HPI.   Lab Results  Component Value Date   WBC 8.5 02/28/2020   HGB 13.3 02/28/2020   HCT 40.2 02/28/2020   MCV 87.6 02/28/2020   PLT 266 02/28/2020    Lab Results  Component Value Date   CREATININE 0.94 02/28/2020   BUN 13 02/28/2020   NA 140 02/28/2020   K 4.6 02/28/2020   CL 106 02/28/2020   CO2 26 02/28/2020    Lab Results  Component Value Date   ALT 18 02/28/2020   AST 16 02/28/2020   ALKPHOS 70 03/23/2016   BILITOT 0.6 02/28/2020     Physical Exam: BP 130/70   Pulse 85   Ht 5\' 5"  (1.651 m)   Wt 276 lb (125.2 kg)   BMI 45.93 kg/m  Constitutional: Pleasant,well-developed, female in no acute distress. HEENT: Normocephalic and atraumatic. Conjunctivae are normal. No scleral icterus. Neck supple.  Cardiovascular: Normal rate, regular rhythm.  Pulmonary/chest: Effort normal and breath sounds normal.  Abdominal: Soft, nondistended,  protuberant, nontender.  There are no masses palpable. Extremities: no edema Lymphadenopathy: No cervical adenopathy noted. Neurological: Alert and oriented to person place and time. Skin: Skin is warm and dry. No rashes noted. Psychiatric: Normal mood and affect. Behavior is normal.   ASSESSMENT AND PLAN: 55 year old female here to reestablish care regarding the following:  Atypical chest pain  Fatty liver  We discussed her history of chest pain as outlined above.  She does not have any baseline reflux symptoms, no clear precipitants that tend to cause this.  She does feel what seems like an air bubble in her chest which then lasts a few minutes and resolves.  We discussed differential diagnosis.  GERD would seem to be less likely cause of this as she describes it today, and PPI did not help prevent episodes.  Esophageal spasm certainly possible and we discussed that.  She has not had an episode in over a year now, she attributes that to a probiotic that she has been taking, although it is unclear to me how this would help resolve the symptoms unless it decreased a significant amount of bloating and gas she has been having, although that is mostly in her lower abdomen before and would be very unusual.  She does not have any exertional symptoms and symptoms are also atypical for cardiac etiology but should touch base with her primary care to see if stress testing is warranted.  At this point time I would be interested to see how she does when she stops a probiotic and monitor for recurrent symptoms.  If she does have a recurrent episode she could try taking peppermint deltoids to relax her esophagus to see if that helps.  I think given she is not had an episode in over a year that the  likelihood of EGD showing clear etiology for this is low.  We will hold off on that for now unless she has recurrent symptoms that are otherwise unexplained.  She agreed with this.  Otherwise we discussed fatty liver  disease, risks for cirrhosis long-term.  Her LFTs are normal at this time.  She does consume 3 alcoholic beverages per day on average so that could be contributing to the fatty liver, recommend she reduce her intake if possible.  She will also work on weight loss and is currently on medication for that.  She should have her LFTs checked yearly for this issue. She agreed:  Plan: - stop probiotic, monitor for recurrent symptoms - trial of peppermint supplement if recurrent symptoms to try and abort an episode (can use peppermint althoids) - if she has recurrent symptoms moving forward she can contact me but I think yield of EGD at this time is low.  If recurrent symptoms consider cardiology evaluation. - counseled on fatty liver, would recommend reduction of alcohol use and continue efforts of weight loss.  Check LFTs yearly.  Total time spent 45 minutes including review of chart, time with patient, documenting this encounter.  Gap Cellar, MD [facility]  CC: Unk Pinto, MD

## 2020-04-02 ENCOUNTER — Other Ambulatory Visit: Payer: Self-pay | Admitting: Adult Health Nurse Practitioner

## 2020-04-02 ENCOUNTER — Encounter: Payer: Self-pay | Admitting: Adult Health Nurse Practitioner

## 2020-04-02 DIAGNOSIS — F411 Generalized anxiety disorder: Secondary | ICD-10-CM

## 2020-04-02 NOTE — Progress Notes (Signed)
FOLLOW UP 3 MONTH  Assessment and Plan:  Wendy Levine was seen today for follow-up and hypertension.  Diagnoses and all orders for this visit:  Essential hypertension Continue to monitor blood pressure at home discussed goal for patient -     CBC with Differential/Platelet -     COMPLETE METABOLIC PANEL WITH GFR -     amLODipine (NORVASC) 5 MG tablet; Take 1 tablet (5 mg total) by mouth daily.  Hyperlipidemia, mixed Continue medications: Rosuvastatin Discussed dietary and exercise modifications Low fat diet -     Lipid panel  Vitamin D deficiency Continue supplementation to maintain goal of 70-100 Taking Vitamin D50,000IU three times a week IU daily -     VITAMIN D 25 Hydroxy (Vit-D Deficiency, Fractures)  Vaginal candida -     fluconazole (DIFLUCAN) 150 MG tablet; Take one tablet at onset of symptoms and second tablet next day.  Use as needed.  Benign paroxysmal positional vertigo due to bilateral vestibular disorder Discussed Epley manuver, Pt instructions provided -add Zyrtec?  Anxiety state Doing well on current regiment Continue  Discussed stress management techniques   Discussed good sleep hygiene Discussed increasing physical activity and exercise Increase water intake  Depression, major, recurrent, in partial remission (HCC) Continue medications: escitalopram Discussed stress management techniques  Discussed, increase water,intake & good sleep hygiene  Discussed increasing exercise & vegetables in diet  Morbid Obesity with BMI 45.0-49.9 Discussed dietary and exercise modifications Rx Wegovy 0.25mg  first month Discussed medication and side effects, titration of medication. Patient has tied Phentermine, unable to tolerate palpitations also tried topamax with out weight loss. Follow up in one month for weight management   Continue diet and meds as discussed. Further disposition pending results of labs.  Further disposition pending results if labs check today.  Discussed med's effects and SE's.   Over 30 minutes of face to face interview, exam, counseling, chart review, and critical decision making was performed.    Future Appointments  Date Time Provider [place]  04/30/2020 11:30 AM Wendy Sierras, NP [facility] None  05/06/2020 10:00 AM Wendy Levine, Wendy Sewer, NP [facility] None     HPI 55 y.o. female  presents for two week follow up on mood and blood pressure.  She has been checking her blood pressure at home and brought a log with her today.  Blood pressure diastolic ranges from 732-202 over 66-80 with pulse range 60's-70's.  She is taking amlodapine 5mg  daily.  She denies any headaches, dizziness, chest pain or shortness of breath. She has neem taking escitalopram 20mg  daily for mood.  Reports she has noticed an improvement in her mood.  Reports she is sleeping through the night and it has improved.  She also has alprazolam PRN, but has not used this.   Her blood pressure has been controlled at home , today their BP is BP: 136/80    She does not workout. She denies chest pain, dizziness, SOB.  She is on cholesterol medication, crestor 10mg  2-3 X A WEEK and denies myalgias. Her cholesterol is at goal. The cholesterol last visit was:   Lab Results  Component Value Date   CHOL 185 02/28/2020   HDL 56 02/28/2020   LDLCALC 98 02/28/2020   TRIG 211 (H) 02/28/2020   CHOLHDL 3.3 02/28/2020    She has been working on diet and exercise for prediabetes with insulin resistance due to obesity, and denies paresthesia of the feet, polydipsia, polyuria and visual disturbances. Last A1C in the office was:  Lab Results  Component Value Date   HGBA1C 5.4 04/06/2019   Patient is on Vitamin D supplement.   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 Phineas Real BMI is Body mass index is 45.43 kg/m., she is working on diet and  exercise. She has OSA and is on CPAP.  Wt Readings from Last 3 Encounters:  03/27/20 276 lb (125.2 kg)  03/26/20 273 lb (123.8 kg)  02/28/20 271 lb (122.9 kg)    Current Medications:   Current Outpatient Medications (Endocrine & Metabolic):  .  etonogestrel (NEXPLANON) 68 MG IMPL implant, 1 each by Subdermal route once.  Current Outpatient Medications (Cardiovascular):  .  amLODipine (NORVASC) 5 MG tablet, Take 1 tablet (5 mg total) by mouth daily. .  rosuvastatin (CRESTOR) 10 MG tablet, Take   1 tablet   Daily for Cholesterol     Current Outpatient Medications (Other):  Marland Kitchen  ALPRAZolam (XANAX) 0.5 MG tablet, TAKE 1/2 TO 1 TABLET BY MOUTH TWO TO THREE TIMES A DAY AS NEEDED FOR ANXIETY OR PANIC .  escitalopram (LEXAPRO) 20 MG tablet, TAKE ONE TABLET BY MOUTH DAILY FOR MOOD .  fluconazole (DIFLUCAN) 150 MG tablet, Take one tablet at onset of symptoms and second tablet next day.  Use as needed. Marland Kitchen  MAGNESIUM PO, Take by mouth as needed. Reported on 06/27/2015 .  Semaglutide-Weight Management (WEGOVY) 0.25 MG/0.5ML SOAJ, Inject 0.25 mg into the skin once a week. .  Semaglutide-Weight Management (WEGOVY) 0.5 MG/0.5ML SOAJ, Inject 0.5 mg into the skin once a week. To be filled after 0.25mg  .  traZODone (DESYREL) 100 MG tablet, Take 1 tablet (100 mg total) by mouth at bedtime. .  valACYclovir (VALTREX) 500 MG tablet, TAKE ONE TO TWO TABLETS BY MOUTH DAILY FOR FEVER BLISTER PROPHYLAXIS .  Vitamin D, Ergocalciferol, (DRISDOL) 1.25 MG (50000 UT) CAPS capsule, Take 1 capsule 3 x  /week for severe Vitamin D  Deficiency (Patient taking differently: Take 1 capsule 1 x  /week for severe Vitamin D  Deficiency)  Medical History:  Past Medical History:  Diagnosis Date  . Allergy   . Anemia   . Anxiety   . ASCUS (atypical squamous cells of undetermined significance) on Pap smear    neg HR HPV 09/2008, ascus 06/2009,10/2009  . Fatty liver 08/2018   ultrasound  . Fatty liver   . HSV-2 infection   .  Hypercholesteremia   . Hypertension   . OSA on CPAP   . Pes planus of both feet 06/20/2013  . Prediabetes   . Sleep apnea    Health Maintenance Immunization History  Administered Date(s) Administered  . Influenza Inj Mdck Quad With Preservative 12/01/2017  . Influenza Split 02/02/2011  . Influenza,inj,Quad PF,6+ Mos 11/21/2012, 11/10/2013, 11/14/2014  . PFIZER(Purple Top)SARS-COV-2 Vaccination 04/30/2019, 05/30/2019  . Td 06/23/2005  . Tdap 07/09/2015    Tetanus: 2017 Pneumovax: N/A Prevnar 13: N/A Flu vaccine: 2020 today Zostavax: N/A  Pap: 09/2016 normal, Dr. Marvel Plan, DUE MGM: 01/2018 DEXA: 08/2014 Colonoscopy: 06/2016 Normal Dr. Havery Moros due 5 years Hpylori breath test 2016 negative EGD:N/A Echo 2007 PFT 06/2015 Last Eye Exam: Dr. Clydene Laming Dentist: Dr. Jake Seats Orthodontist- got braces Dr. Ulyess Blossom  Patient Care Team: Unk Pinto, MD as PCP - General (Internal Medicine) Berenice Primas, MD as Referring Physician (Orthopedic Surgery) Lelon Perla, MD as Consulting Physician (Cardiology) Fontaine,  Belinda Block, MD (Inactive) as Consulting Physician (Gynecology) Rolm Bookbinder, MD as Consulting Physician (Dermatology) Tania Ade, MD as Consulting Physician (Orthopedic Surgery)   Allergies:  Allergies  Allergen Reactions  . Doxycycline Hives and Shortness Of Breath  . Ace Inhibitors     cough  . Adhesive [Tape]   . Augmentin [Amoxicillin-Pot Clavulanate] Nausea Only    diarrhea  . Clindamycin/Lincomycin   . Epinephrine-Lidocaine-Na Metabisulfite [Lidocaine-Epinephrine]     Has heart palpitations from the epi, wants without  . Levaquin [Levofloxacin] Other (See Comments)    Dysphoria  . Vicodin [Hydrocodone-Acetaminophen] Itching    SURGICAL HISTORY She  has a past surgical history that includes Cesarean section; remove moles; Colposcopy; Knee surgery; Mouth surgery; Nexplanon insertion (05/22/2015); and Colonoscopy (2005?). FAMILY HISTORY Her  family history includes Breast cancer (age of onset: 19) in her maternal grandmother; Cancer in her mother; Emphysema in her maternal grandmother; Hyperlipidemia in her mother; Hypertension in her maternal grandmother and mother; Other in her father. SOCIAL HISTORY She  reports that she quit smoking about 20 years ago. Her smoking use included cigarettes. She has a 3.00 pack-year smoking history. She has never used smokeless tobacco. She reports current alcohol use of about 11.0 - 15.0 standard drinks of alcohol per week. She reports that she does not use drugs.  Review of Systems:  Review of Systems  Constitutional: Negative for chills, diaphoresis, fever, malaise/fatigue and weight loss.  HENT: Negative.   Eyes: Negative.   Respiratory: Negative for cough, hemoptysis, sputum production, shortness of breath and wheezing.   Cardiovascular: Negative.  Negative for chest pain, palpitations, orthopnea, claudication, leg swelling and PND.  Gastrointestinal: Negative for constipation.  Genitourinary: Negative.   Musculoskeletal: Negative for back pain, falls, joint pain, myalgias and neck pain.  Skin: Negative.   Neurological: Negative.  Negative for weakness.  Psychiatric/Behavioral: Negative for depression, hallucinations, memory loss, substance abuse and suicidal ideas. The patient is not nervous/anxious and does not have insomnia.    Physical Exam: BP 136/80   Pulse 63   Temp (!) 97.3 F (36.3 C)   Wt 273 lb (123.8 kg)   SpO2 95%   BMI 45.43 kg/m  Wt Readings from Last 3 Encounters:  03/27/20 276 lb (125.2 kg)  03/26/20 273 lb (123.8 kg)  02/28/20 271 lb (122.9 kg)   General Appearance: Well nourished, in no apparent distress. Eyes: PERRLA, EOMs, conjunctiva no swelling or erythema Sinuses: No Frontal/maxillary tenderness ENT/Mouth: Ext aud canals clear, TMs without erythema, bulging. Mouth and nose not examined- patient wearing a facemask. Hearing normal.  Neck: Supple, thyroid  normal.  Respiratory: Respiratory effort normal, BS equal bilaterally without rales, rhonchi, wheezing or stridor.  Cardio: RRR with no MRGs. Brisk peripheral pulses without edema.  Abdomen: Soft, + BS,  + epigastric tenderness, no guarding, rebound, hernias, masses. Lymphatics: Non tender without lymphadenopathy.  Musculoskeletal: Full ROM, 5/5 strength, Normal gait, Skin: Warm, dry without rashes, lesions, ecchymosis. + left hip greater trocanteric tenderness Neuro: Cranial nerves intact. Normal muscle tone, no cerebellar symptoms. Psych: Awake and oriented X 3, normal affect, Insight and Judgment appropriate.    Wendy Sierras, NP 12:07 PM [facility]

## 2020-04-05 ENCOUNTER — Encounter: Payer: 59 | Admitting: Physician Assistant

## 2020-04-29 NOTE — Progress Notes (Signed)
FOLLOW UP 3 MONTH  Assessment and Levine:  Wendy Levine was seen today for follow-up and hypertension.  Diagnoses and all orders for this visit:  Morbid Obesity with BMI 45.0-49.9 Discussed dietary and exercise modifications Will check on PA next Rx Wendy Levine 0.5mg   Discussed medication and side effects, titration of medication. Patient has tied Phentermine, unable to tolerate palpitations also tried topamax with out weight loss. -     phentermine (ADIPEX-P) 37.5 MG tablet; Take 1 tablet (37.5 mg total) by mouth daily before breakfast.  Essential hypertension Continue to monitor blood pressure at home discussed goal for patient -     CBC with Differential/Platelet -     COMPLETE METABOLIC PANEL WITH GFR -     amLODipine (NORVASC) 5 MG tablet; Take 1 tablet (5 mg total) by mouth daily.  Depression, major, recurrent, in partial remission (Wendy Levine) Continue medications: escitalopram Discussed stress management techniques  Discussed, increase water,intake & good sleep hygiene  Discussed increasing exercise & vegetables in diet  Acute bilateral low back pain with sciatica, sciatica laterality unspecified Has appointment with Dr Wendy Levine 05/31/20   Follow up in one month for weight management   Continue diet and meds as discussed. Further disposition pending results of labs.  Further disposition pending results if labs check today. Discussed med's effects and SE's.   Over 30 minutes of face to face interview, exam, counseling, chart review, and critical decision making was performed.    Future Appointments  Date Time Provider Wendy Levine  05/06/2020 10:00 AM Wendy Sierras, NP [facility] None     HPI 55 y.o. female  presents for Weight management.  Last OV we did shots of Wendy Levine, reports this is going well.  Used her last shot one week ago. She would like to keep using this.  Next dose would be 0.5mg  daily for one month.   Saw Dr Wendy Levine, epidural that was helpful.  After two days this  wore off.  She is now having sharp pain to cental low back.  Has appointment Dr Wendy Levine on April 6th for valuation of this low back pain.  She reports her mood is doing well, she is taking escitalopram.20mg  daily. She has beem taking escitalopram 20mg  daily for mood.  Reports she has noticed an improvement in her mood.  Reports she is sleeping through Wendy night and it has improved.  She also has alprazolam PRN, but has not used this.   Her blood pressure has been controlled at home , today their BP is BP: 126/88    She does not workout. She denies chest pain, dizziness, SOB.  She is on cholesterol medication, crestor 10mg  2-3 X A WEEK and denies myalgias. Her cholesterol is at goal. Wendy cholesterol last visit was:   Lab Results  Component Value Date   CHOL 185 02/28/2020   HDL 56 02/28/2020   LDLCALC 98 02/28/2020   TRIG 211 (H) 02/28/2020   CHOLHDL 3.3 02/28/2020    She has been working on diet and exercise for prediabetes with insulin resistance due to obesity, and denies paresthesia of Wendy feet, polydipsia, polyuria and visual disturbances. Last A1C in Wendy office was:  Lab Results  Component Value Date   HGBA1C 5.4 04/06/2019   Patient is on Vitamin D supplement.   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 Phineas Levine  BMI is Body mass index is 45.36 kg/m., she is working on diet and exercise. She has OSA and is on Wendy Levine.  Wt Readings from Last 3 Encounters:  04/30/20 272 lb 9.6 oz (123.7 kg)  03/27/20 276 lb (125.2 kg)  03/26/20 273 lb (123.8 kg)    Current Medications:   Current Outpatient Medications (Endocrine & Metabolic):    etonogestrel (NEXPLANON) 68 MG IMPL implant, 1 each by Subdermal route once.  Current Outpatient Medications (Cardiovascular):    amLODipine (NORVASC) 5 MG tablet, Take 1 tablet (5 mg total) by mouth daily.    rosuvastatin (CRESTOR) 10 MG tablet, Take   1 tablet   Daily for Cholesterol     Current Outpatient Medications (Other):    ALPRAZolam (XANAX) 0.5 MG tablet, TAKE 1/2 TO 1 TABLET BY MOUTH TWO TO THREE TIMES A DAY AS NEEDED FOR ANXIETY OR PANIC   escitalopram (LEXAPRO) 20 MG tablet, TAKE ONE TABLET BY MOUTH DAILY FOR MOOD   MAGNESIUM PO, Take by mouth as needed. Reported on 06/27/2015   phentermine (ADIPEX-P) 37.5 MG tablet, Take 1 tablet (37.5 mg total) by mouth daily before breakfast.   traZODone (DESYREL) 100 MG tablet, Take 1 tablet (100 mg total) by mouth at bedtime.   valACYclovir (VALTREX) 500 MG tablet, TAKE ONE TO TWO TABLETS BY MOUTH DAILY FOR FEVER BLISTER PROPHYLAXIS   Vitamin D, Ergocalciferol, (DRISDOL) 1.25 MG (50000 UT) CAPS capsule, Take 1 capsule 3 x  /week for severe Vitamin D  Deficiency (Patient taking differently: Take 1 capsule 1 x  /week for severe Vitamin D  Deficiency)  Medical History:  Past Medical History:  Diagnosis Date   Allergy    Anemia    Anxiety    ASCUS (atypical squamous cells of undetermined significance) on Pap smear    neg HR HPV 09/2008, ascus 06/2009,10/2009   Fatty liver 08/2018   ultrasound   Fatty liver    HSV-2 infection    Hypercholesteremia    Hypertension    OSA on Wendy Levine    Pes planus of both feet 06/20/2013   Prediabetes    Sleep apnea    Health Maintenance Immunization History  Administered Date(s) Administered   Influenza Inj Mdck Quad With Preservative 12/01/2017   Influenza Split 02/02/2011   Influenza,inj,Quad PF,6+ Mos 11/21/2012, 11/10/2013, 11/14/2014   PFIZER(Purple Top)SARS-COV-2 Vaccination 04/30/2019, 05/30/2019   Td 06/23/2005   Tdap 07/09/2015    Tetanus: 2017 Pneumovax: N/A Prevnar 13: N/A Flu vaccine: 2020 today Zostavax: N/A  Pap: 09/2016 normal, Dr. Marvel Levine, DUE MGM: 01/2018 DEXA: 08/2014 Colonoscopy: 06/2016 Normal Dr. Havery Levine due 5 years Hpylori breath test 2016  negative EGD:N/A Echo 2007 PFT 06/2015 Last Eye Exam: Dr. Clydene Levine Dentist: Dr. Jake Levine   Patient Care Team: Wendy Pinto, MD as PCP - General (Internal Medicine) Wendy Primas, MD as Referring Physician (Orthopedic Surgery) Lelon Perla, MD as Consulting Physician (Cardiology) Fontaine, Belinda Block, MD (Inactive) as Consulting Physician (Gynecology) Rolm Bookbinder, MD as Consulting Physician (Dermatology) Tania Ade, MD as Consulting Physician (Orthopedic Surgery)   Allergies:  Allergies  Allergen Reactions   Doxycycline Hives and Shortness Of Breath   Ace Inhibitors     cough   Adhesive [Tape]    Augmentin [Amoxicillin-Pot Clavulanate] Nausea Only    diarrhea   Clindamycin/Lincomycin    Epinephrine-Lidocaine-Na Metabisulfite [Lidocaine-Epinephrine]     Has heart palpitations from Wendy epi, wants without   Levaquin [Levofloxacin] Other (See Comments)    Dysphoria   Vicodin [Hydrocodone-Acetaminophen] Itching  SURGICAL HISTORY She  has a past surgical history that includes Cesarean section; remove moles; Colposcopy; Knee surgery; Mouth surgery; Nexplanon insertion (05/22/2015); and Colonoscopy (2005?). FAMILY HISTORY Her family history includes Breast cancer (age of onset: 51) in her maternal grandmother; Cancer in her mother; Emphysema in her maternal grandmother; Hyperlipidemia in her mother; Hypertension in her maternal grandmother and mother; Other in her father. SOCIAL HISTORY She  reports that she quit smoking about 20 years ago. Her smoking use included cigarettes. She has a 3.00 pack-year smoking history. She has never used smokeless tobacco. She reports current alcohol use of about 11.0 - 15.0 standard drinks of alcohol per week. She reports that she does not use drugs.  Review of Systems:  Review of Systems  Constitutional: Negative for chills, diaphoresis, fever, malaise/fatigue and weight loss.  HENT: Negative.   Eyes: Negative.    Respiratory: Negative for cough, hemoptysis, sputum production, shortness of breath and wheezing.   Cardiovascular: Negative.  Negative for chest pain, palpitations, orthopnea, claudication, leg swelling and PND.  Gastrointestinal: Negative for constipation.  Genitourinary: Negative.   Musculoskeletal: Negative for back pain, falls, joint pain, myalgias and neck pain.  Skin: Negative.   Neurological: Negative.  Negative for weakness.  Psychiatric/Behavioral: Negative for depression, hallucinations, memory loss, substance abuse and suicidal ideas. Wendy patient is not nervous/anxious and does not have insomnia.    Physical Exam: BP 126/88    Pulse 83    Temp 97.7 F (36.5 C)    Wt 272 lb 9.6 oz (123.7 kg)    SpO2 96%    BMI 45.36 kg/m  Wt Readings from Last 3 Encounters:  04/30/20 272 lb 9.6 oz (123.7 kg)  03/27/20 276 lb (125.2 kg)  03/26/20 273 lb (123.8 kg)   General Appearance: Well nourished, in no apparent distress. Eyes: PERRLA, EOMs, conjunctiva no swelling or erythema Sinuses: No Frontal/maxillary tenderness ENT/Mouth: Ext aud canals clear, TMs without erythema, bulging. Mouth and nose not examined- patient wearing a facemask. Hearing normal.  Neck: Supple, thyroid normal.  Respiratory: Respiratory effort normal, BS equal bilaterally without rales, rhonchi, wheezing or stridor.  Cardio: RRR with no MRGs. Brisk peripheral pulses without edema.  Abdomen: Soft, + BS,  + epigastric tenderness, no guarding, rebound, hernias, masses. Lymphatics: Non tender without lymphadenopathy.  Musculoskeletal: Full ROM, 5/5 strength, Normal gait, Skin: Warm, dry without rashes, lesions, ecchymosis. + sacral point tenederness, left Neuro: Cranial nerves intact. Normal muscle tone, no cerebellar symptoms. Psych: Awake and oriented X 3, normal affect, Insight and Judgment appropriate.    Wendy Sierras, NP 12:07 PM [facility]

## 2020-04-30 ENCOUNTER — Other Ambulatory Visit: Payer: Self-pay

## 2020-04-30 ENCOUNTER — Ambulatory Visit (INDEPENDENT_AMBULATORY_CARE_PROVIDER_SITE_OTHER): Payer: 59 | Admitting: Adult Health Nurse Practitioner

## 2020-04-30 ENCOUNTER — Encounter: Payer: Self-pay | Admitting: Adult Health Nurse Practitioner

## 2020-04-30 VITALS — BP 126/88 | HR 83 | Temp 97.7°F | Wt 272.6 lb

## 2020-04-30 DIAGNOSIS — I10 Essential (primary) hypertension: Secondary | ICD-10-CM

## 2020-04-30 DIAGNOSIS — M5440 Lumbago with sciatica, unspecified side: Secondary | ICD-10-CM | POA: Diagnosis not present

## 2020-04-30 DIAGNOSIS — F3341 Major depressive disorder, recurrent, in partial remission: Secondary | ICD-10-CM | POA: Diagnosis not present

## 2020-04-30 DIAGNOSIS — Z6841 Body Mass Index (BMI) 40.0 and over, adult: Secondary | ICD-10-CM

## 2020-04-30 MED ORDER — PHENTERMINE HCL 37.5 MG PO TABS: 37.5000 mg | ORAL_TABLET | Freq: Every day | ORAL | 2 refills | Status: DC

## 2020-05-06 ENCOUNTER — Encounter: Payer: 59 | Admitting: Adult Health Nurse Practitioner

## 2020-05-07 DIAGNOSIS — F411 Generalized anxiety disorder: Secondary | ICD-10-CM

## 2020-05-07 MED ORDER — ESCITALOPRAM OXALATE 20 MG PO TABS
ORAL_TABLET | ORAL | 3 refills | Status: DC
Start: 2020-05-07 — End: 2021-05-03

## 2020-05-07 MED ORDER — ALPRAZOLAM 0.5 MG PO TABS: 0.5000 mg | ORAL_TABLET | Freq: Three times a day (TID) | ORAL | 0 refills | Status: DC | PRN

## 2020-05-17 ENCOUNTER — Other Ambulatory Visit: Payer: Self-pay | Admitting: Adult Health Nurse Practitioner

## 2020-05-23 ENCOUNTER — Ambulatory Visit (INDEPENDENT_AMBULATORY_CARE_PROVIDER_SITE_OTHER): Payer: 59 | Admitting: Adult Health Nurse Practitioner

## 2020-05-23 ENCOUNTER — Other Ambulatory Visit: Payer: Self-pay

## 2020-05-23 ENCOUNTER — Encounter: Payer: Self-pay | Admitting: Adult Health Nurse Practitioner

## 2020-05-23 VITALS — BP 126/88 | HR 68 | Temp 98.1°F | Ht 65.0 in | Wt 274.0 lb

## 2020-05-23 DIAGNOSIS — G8929 Other chronic pain: Secondary | ICD-10-CM

## 2020-05-23 DIAGNOSIS — R7309 Other abnormal glucose: Secondary | ICD-10-CM

## 2020-05-23 DIAGNOSIS — Z0001 Encounter for general adult medical examination with abnormal findings: Secondary | ICD-10-CM

## 2020-05-23 DIAGNOSIS — F411 Generalized anxiety disorder: Secondary | ICD-10-CM

## 2020-05-23 DIAGNOSIS — E611 Iron deficiency: Secondary | ICD-10-CM

## 2020-05-23 DIAGNOSIS — E8881 Metabolic syndrome: Secondary | ICD-10-CM

## 2020-05-23 DIAGNOSIS — G4733 Obstructive sleep apnea (adult) (pediatric): Secondary | ICD-10-CM

## 2020-05-23 DIAGNOSIS — M545 Low back pain, unspecified: Secondary | ICD-10-CM

## 2020-05-23 DIAGNOSIS — Z1389 Encounter for screening for other disorder: Secondary | ICD-10-CM | POA: Diagnosis not present

## 2020-05-23 DIAGNOSIS — I10 Essential (primary) hypertension: Secondary | ICD-10-CM | POA: Diagnosis not present

## 2020-05-23 DIAGNOSIS — Z136 Encounter for screening for cardiovascular disorders: Secondary | ICD-10-CM

## 2020-05-23 DIAGNOSIS — E782 Mixed hyperlipidemia: Secondary | ICD-10-CM

## 2020-05-23 DIAGNOSIS — Z0000 Encounter for general adult medical examination without abnormal findings: Secondary | ICD-10-CM

## 2020-05-23 DIAGNOSIS — Z1321 Encounter for screening for nutritional disorder: Secondary | ICD-10-CM

## 2020-05-23 DIAGNOSIS — B009 Herpesviral infection, unspecified: Secondary | ICD-10-CM

## 2020-05-23 DIAGNOSIS — F3341 Major depressive disorder, recurrent, in partial remission: Secondary | ICD-10-CM

## 2020-05-23 DIAGNOSIS — E88819 Insulin resistance, unspecified: Secondary | ICD-10-CM

## 2020-05-23 DIAGNOSIS — E559 Vitamin D deficiency, unspecified: Secondary | ICD-10-CM

## 2020-05-23 DIAGNOSIS — F5101 Primary insomnia: Secondary | ICD-10-CM

## 2020-05-23 MED ORDER — WEGOVY 0.25 MG/0.5ML ~~LOC~~ SOAJ: 0.2500 mg | SUBCUTANEOUS | 1 refills | Status: DC

## 2020-05-23 NOTE — Progress Notes (Signed)
COMPLETE PHYSICAL   Assessment and Plan:   Encounter for general adult medical examination with abnormal findings Yearly  Essential hypertension Continue current medications: Norvasc 10mg  Monitor blood pressure at home; call if consistently over 130/80 Continue DASH diet.   Reminder to go to the ER if any CP, SOB, nausea, dizziness, severe HA, changes vision/speech, left arm numbness and tingling and jaw pain. -     CBC with Differential/Platelet -     BASIC METABOLIC PANEL WITH GFR -     Hepatic function panel -     TSH -     EKG 12-Lead  OSA (obstructive sleep apnea) Continue CPAP  Hypercholesteremia Continue medications: Rosuvastatin 10mg  Discussed dietary and exercise modifications Low fat diet -     Lipid panel  Class 2 obesity due to excess calories without serious comorbidity with body mass index (BMI) of 39.0 to 39.9 in adult - long discussion about weight loss, diet, and exercise Working toward approved al for [employer] injections, weekly.  Medication management -     Magnesium  Vitamin D deficiency -     VITAMIN D 25 Hydroxy (Vit-D Deficiency, Fractures)  Insulin resistance Continue weight loss  Anemia, unspecified type - monitor, continue iron supp with Vitamin C and increase green leafy veggies  HSV-2 infection Continue meds PRN  Insomnia Improved Using alprazolam 0.5mg  PRN  Depression / Anxiety Doing well Continue medications: escitalopram 20mg  daily Discussed stress management techniques  Discussed, increase water,intake & good sleep hygiene  Discussed increasing exercise & vegetables in diet   Chronic midline low back pain without sciatica Consider tumerick for this Follows with Dr Mina Marble   Abnormal glucose Discussed dietary and exercise modifications -     Hemoglobin A1c; Future  Vitamin D deficiency -     Cancel: VITAMIN D 25 Hydroxy (Vit-D Deficiency, Fractures) -     VITAMIN D 25 Hydroxy (Vit-D Deficiency, Fractures);  Future  Screening for blood or protein in urine -     Urinalysis w microscopic + reflex cultur  Encounter for vitamin deficiency screening -     Cancel: Vitamin B12; Future  Screening, ischemic heart disease -     EKG 12-Lead  Patient to return in one day for labs related to time constraints, left her mother home alone.  Continue diet and meds as discussed. Further disposition pending results of labs. Over 40 minutes of face to face interview, exam, counseling, chart review, and critical decision making was performed  Future Appointments  Date Time Provider [place]  05/26/2021  2:00 PM Yassine Brunsman, [name], NP [facility] None     HPI 55 y.o. female  presents for CPE and 3 month follow up on HTN, HLD, prediabetes, obesity, and vitamin D deficiency.   Her blood pressure has been controlled at home , today their BP is BP: 126/88   She live with mother and moved in with her from [place].  She monitors her mother mood and reports some mild cognitive decline, can complete all self ADL's. She has been working to help her mother decrease her ETOH intake.  She did not tolerate amitriptyline, had red eye and pressure- has stopped. She is taking escitlopram, her anxiety has improved.  She has alprazolam prn and uses this typically at bedtime to help with sleep.  DG lumbar, 09/21/19 showed L5-S1 disc space narrowing, mild L4-5 facet degenerative change with small bowel loop thickened folds.  Recent reached out to Dr Mina Marble regarding increase in lower back pain and received an  injection within the past month.  She has used linzess in the past for constipation.  Now diet and lifestyle controlled. She works to drink 64-80 ounces of water a day.   She does not workout. She denies chest pain, dizziness, SOB.  She is on cholesterol medication, crestor 10mg  2-3 X A WEEK and denies myalgias. Her cholesterol is at goal. The cholesterol last visit was:   Lab Results  Component Value Date   CHOL  185 02/28/2020   HDL 56 02/28/2020   LDLCALC 98 02/28/2020   TRIG 211 (H) 02/28/2020   CHOLHDL 3.3 02/28/2020    She has been working on diet and exercise for prediabetes with insulin resistance due to obesity, and denies paresthesia of the feet, polydipsia, polyuria and visual disturbances. Last A1C in the office was:  Lab Results  Component Value Date   HGBA1C 5.4 04/06/2019   Patient is on Vitamin D supplement.   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 Erin Fulling.  She now has Nexplanon.  BMI is Body mass index is 45.6 kg/m., she is working on diet and exercise. She has OSA and is on CPAP.  Which she wears every night and reports benefit, feeling well resting in the morning.  Wt Readings from Last 3 Encounters:  05/23/20 274 lb (124.3 kg)  04/30/20 272 lb 9.6 oz (123.7 kg)  03/27/20 276 lb (125.2 kg)    Current Medications:   Current Outpatient Medications (Endocrine & Metabolic):  .  etonogestrel (NEXPLANON) 68 MG IMPL implant, 1 each by Subdermal route once.  Current Outpatient Medications (Cardiovascular):  .  amLODipine (NORVASC) 5 MG tablet, Take 1 tablet (5 mg total) by mouth daily. .  rosuvastatin (CRESTOR) 10 MG tablet, Take   1 tablet   Daily for Cholesterol     Current Outpatient Medications (Other):  Marland Kitchen  ALPRAZolam (XANAX) 0.5 MG tablet, Take 1 tablet (0.5 mg total) by mouth 3 (three) times daily as needed for sleep or anxiety. Marland Kitchen  escitalopram (LEXAPRO) 20 MG tablet, TAKE ONE TABLET BY MOUTH DAILY FOR MOOD .  MAGNESIUM PO, Take by mouth as needed. Reported on 06/27/2015 .  Semaglutide-Weight Management (WEGOVY) 0.25 MG/0.5ML SOAJ, Inject 0.25 mg into the skin once a week. .  valACYclovir (VALTREX) 500 MG tablet, TAKE ONE TO TWO TABLETS BY MOUTH DAILY FOR FEVER BLISTER PROPHYLAXIS .  Vitamin D, Ergocalciferol, (DRISDOL) 1.25  MG (50000 UT) CAPS capsule, Take 1 capsule 3 x  /week for severe Vitamin D  Deficiency (Patient taking differently: Take 1 capsule 1 x  /week for severe Vitamin D  Deficiency)  Medical History:  Past Medical History:  Diagnosis Date  . Allergy   . Anemia   . Anxiety   . ASCUS (atypical squamous cells of undetermined significance) on Pap smear    neg HR HPV 09/2008, ascus 06/2009,10/2009  . Fatty liver 08/2018   ultrasound  . Fatty liver   . HSV-2 infection   . Hypercholesteremia   . Hypertension   . OSA on CPAP   . Pes planus of both feet 06/20/2013  . Prediabetes   . Sleep apnea    Health Maintenance Immunization History  Administered Date(s) Administered  . Influenza Inj Mdck Quad With Preservative 12/01/2017  . Influenza Split 02/02/2011  . Influenza,inj,Quad PF,6+ Mos 11/21/2012, 11/10/2013, 11/14/2014  .  PFIZER(Purple Top)SARS-COV-2 Vaccination 04/30/2019, 05/30/2019  . Td 06/23/2005  . Tdap 07/09/2015    Tetanus: 2017 Pneumovax: N/A Prevnar 13: N/A Flu vaccine: Due for 2022 Zostavax: N/A  Pap: 09/2016 normal, Dr. Marvel Plan MGM: 01/2018, OVERDUE, family history of breast cancer DEXA: 08/2014 Colonoscopy: 06/2016 Normal Dr. Havery Moros due 5 years Hpylori breath test 2016 negative EGD:N/A Echo 2007 PFT 06/2015 Last Eye Exam: Dr. Clydene Laming Dentist: Dr. Jake Seats Orthodontist- got braces Dr. Ulyess Blossom  Patient Care Team: Unk Pinto, MD as PCP - General (Internal Medicine) Berenice Primas, MD as Referring Physician (Orthopedic Surgery) Lelon Perla, MD as Consulting Physician (Cardiology) Fontaine, Belinda Block, MD (Inactive) as Consulting Physician (Gynecology) Rolm Bookbinder, MD as Consulting Physician (Dermatology) Tania Ade, MD as Consulting Physician (Orthopedic Surgery)   Allergies:  Allergies  Allergen Reactions  . Doxycycline Hives and Shortness Of Breath  . Ace Inhibitors     cough  . Adhesive [Tape]   . Augmentin [Amoxicillin-Pot  Clavulanate] Nausea Only    diarrhea  . Clindamycin/Lincomycin   . Epinephrine-Lidocaine-Na Metabisulfite [Lidocaine-Epinephrine]     Has heart palpitations from the epi, wants without  . Levaquin [Levofloxacin] Other (See Comments)    Dysphoria  . Vicodin [Hydrocodone-Acetaminophen] Itching    SURGICAL HISTORY She  has a past surgical history that includes Cesarean section; remove moles; Colposcopy; Knee surgery; Mouth surgery; Nexplanon insertion (05/22/2015); and Colonoscopy (2005?). FAMILY HISTORY Her family history includes Breast cancer (age of onset: 27) in her maternal grandmother; Cancer in her mother; Emphysema in her maternal grandmother; Hyperlipidemia in her mother; Hypertension in her maternal grandmother and mother; Other in her father. SOCIAL HISTORY She  reports that she quit smoking about 20 years ago. Her smoking use included cigarettes. She has a 3.00 pack-year smoking history. She has never used smokeless tobacco. She reports current alcohol use of about 11.0 - 15.0 standard drinks of alcohol per week. She reports that she does not use drugs.  Review of Systems:  ROS   Physical Exam: BP 126/88   Pulse 68   Temp 98.1 F (36.7 C)   Ht 5\' 5"  (1.651 m)   Wt 274 lb (124.3 kg)   SpO2 98%   BMI 45.60 kg/m  Wt Readings from Last 3 Encounters:  05/23/20 274 lb (124.3 kg)  04/30/20 272 lb 9.6 oz (123.7 kg)  03/27/20 276 lb (125.2 kg)   General Appearance: Well nourished, in no apparent distress. Eyes: PERRLA, EOMs, conjunctiva no swelling or erythema Sinuses: No Frontal/maxillary tenderness ENT/Mouth: Ext aud canals clear, TMs without erythema, bulging. Mouth and nose not examined- patient wearing a facemask. Hearing normal.  Neck: Supple, thyroid normal.  Respiratory: Respiratory effort normal, BS equal bilaterally without rales, rhonchi, wheezing or stridor.  Cardio: RRR with no MRGs. Brisk peripheral pulses without edema.  Abdomen: Soft, obese + BS,  +  epigastric tenderness, no guarding, rebound, hernias, masses. Breast: Defer GYN Lymphatics: Non tender without lymphadenopathy.  Musculoskeletal: Full ROM, 5/5 strength, Normal gait, Patient is able to ambulate well. Gait is not  Antalgic.  Genitourinary: defer Skin: Warm, dry without rashes, lesions, ecchymosis. + left hip greater trocanteric tenderness Neuro: Cranial nerves intact. Normal muscle tone, no cerebellar symptoms. Psych: Awake and oriented X 3, normal affect, Insight and Judgment appropriate.    Garnet Sierras, NP 2:46 PM [facility]

## 2020-05-24 LAB — URINALYSIS W MICROSCOPIC + REFLEX CULTURE
Bacteria, UA: NONE SEEN /HPF
Bilirubin Urine: NEGATIVE
Glucose, UA: NEGATIVE
Hgb urine dipstick: NEGATIVE
Hyaline Cast: NONE SEEN /LPF
Ketones, ur: NEGATIVE
Leukocyte Esterase: NEGATIVE
Nitrites, Initial: NEGATIVE
Protein, ur: NEGATIVE
RBC / HPF: NONE SEEN /HPF (ref 0–2)
Specific Gravity, Urine: 1.011 (ref 1.001–1.03)
Squamous Epithelial / HPF: NONE SEEN /HPF (ref <=5)
WBC, UA: NONE SEEN /HPF (ref 0–5)
pH: 5.5 (ref 5.0–8.0)

## 2020-05-24 LAB — NO CULTURE INDICATED

## 2020-05-25 ENCOUNTER — Other Ambulatory Visit: Payer: Self-pay | Admitting: Adult Health Nurse Practitioner

## 2020-05-25 DIAGNOSIS — R7309 Other abnormal glucose: Secondary | ICD-10-CM

## 2020-05-25 MED ORDER — OZEMPIC (0.25 OR 0.5 MG/DOSE) 2 MG/1.5ML ~~LOC~~ SOPN: PEN_INJECTOR | SUBCUTANEOUS | 0 refills | Status: DC

## 2020-05-25 NOTE — Progress Notes (Signed)
Patient contact, insurance covered [phone], too expensive at this time.  Rx for Ozempic 0.25mg  weekly. Next Rx 0.5mg  if tolerates well.  Patient notified.  Garnet Sierras, Laqueta Jean, DNP [facility] 05/25/2020  2:00 PM

## 2020-05-27 ENCOUNTER — Other Ambulatory Visit: Payer: 59

## 2020-05-27 DIAGNOSIS — Z131 Encounter for screening for diabetes mellitus: Secondary | ICD-10-CM

## 2020-05-27 DIAGNOSIS — E559 Vitamin D deficiency, unspecified: Secondary | ICD-10-CM

## 2020-05-27 DIAGNOSIS — Z79899 Other long term (current) drug therapy: Secondary | ICD-10-CM

## 2020-05-27 DIAGNOSIS — Z130 Encounter for screening for diseases of the blood and blood-forming organs and certain disorders involving the immune mechanism: Secondary | ICD-10-CM

## 2020-05-27 DIAGNOSIS — E611 Iron deficiency: Secondary | ICD-10-CM

## 2020-05-27 DIAGNOSIS — Z13220 Encounter for screening for lipoid disorders: Secondary | ICD-10-CM

## 2020-05-28 LAB — LIPID PANEL
Cholesterol: 177 mg/dL (ref <200)
HDL: 58 mg/dL (ref >=50)
LDL Cholesterol (Calc): 97 mg/dL (calc)
Non-HDL Cholesterol (Calc): 119 mg/dL (calc) (ref <130)
Total CHOL/HDL Ratio: 3.1 (calc) (ref <5.0)
Triglycerides: 123 mg/dL (ref <150)

## 2020-05-28 LAB — VITAMIN D 25 HYDROXY (VIT D DEFICIENCY, FRACTURES): Vit D, 25-Hydroxy: 73 ng/mL (ref 30–100)

## 2020-05-28 LAB — CBC WITH DIFFERENTIAL/PLATELET
Absolute Monocytes: 486 cells/uL (ref 200–950)
Basophils Absolute: 51 cells/uL (ref 0–200)
Basophils Relative: 0.8 %
Eosinophils Absolute: 141 cells/uL (ref 15–500)
Eosinophils Relative: 2.2 %
HCT: 39.6 % (ref 35.0–45.0)
Hemoglobin: 12.6 g/dL (ref 11.7–15.5)
Lymphs Abs: 1235 cells/uL (ref 850–3900)
MCH: 27.9 pg (ref 27.0–33.0)
MCHC: 31.8 g/dL — ABNORMAL LOW (ref 32.0–36.0)
MCV: 87.8 fL (ref 80.0–100.0)
MPV: 10.8 fL (ref 7.5–12.5)
Monocytes Relative: 7.6 %
Neutro Abs: 4486 cells/uL (ref 1500–7800)
Neutrophils Relative %: 70.1 %
Platelets: 220 10*3/uL (ref 140–400)
RBC: 4.51 10*6/uL (ref 3.80–5.10)
RDW: 13.2 % (ref 11.0–15.0)
Total Lymphocyte: 19.3 %
WBC: 6.4 10*3/uL (ref 3.8–10.8)

## 2020-05-28 LAB — COMPLETE METABOLIC PANEL WITH GFR
AG Ratio: 1.8 (calc) (ref 1.0–2.5)
ALT: 16 U/L (ref 6–29)
AST: 16 U/L (ref 10–35)
Albumin: 4.4 g/dL (ref 3.6–5.1)
Alkaline phosphatase (APISO): 78 U/L (ref 37–153)
BUN: 13 mg/dL (ref 7–25)
CO2: 26 mmol/L (ref 20–32)
Calcium: 8.7 mg/dL (ref 8.6–10.4)
Chloride: 104 mmol/L (ref 98–110)
Creat: 0.77 mg/dL (ref 0.50–1.05)
GFR, Est African American: 101 mL/min/{1.73_m2} (ref >=60)
GFR, Est Non African American: 88 mL/min/{1.73_m2} (ref >=60)
Globulin: 2.4 g/dL (calc) (ref 1.9–3.7)
Glucose, Bld: 96 mg/dL (ref 65–99)
Potassium: 4.3 mmol/L (ref 3.5–5.3)
Sodium: 139 mmol/L (ref 135–146)
Total Bilirubin: 0.5 mg/dL (ref 0.2–1.2)
Total Protein: 6.8 g/dL (ref 6.1–8.1)

## 2020-05-28 LAB — VITAMIN B12: Vitamin B-12: 360 pg/mL (ref 200–1100)

## 2020-05-28 LAB — IRON, TOTAL/TOTAL IRON BINDING CAP
%SAT: 23 % (calc) (ref 16–45)
Iron: 90 ug/dL (ref 45–160)
TIBC: 383 mcg/dL (calc) (ref 250–450)

## 2020-05-28 LAB — TSH: TSH: 2.28 mIU/L

## 2020-05-28 LAB — HEMOGLOBIN A1C
Hgb A1c MFr Bld: 5.4 % of total Hgb (ref <5.7)
Mean Plasma Glucose: 108 mg/dL
eAG (mmol/L): 6 mmol/L

## 2020-05-28 LAB — MAGNESIUM: Magnesium: 2.2 mg/dL (ref 1.5–2.5)

## 2020-06-19 ENCOUNTER — Other Ambulatory Visit: Payer: Self-pay | Admitting: Internal Medicine

## 2020-06-19 ENCOUNTER — Other Ambulatory Visit: Payer: Self-pay | Admitting: Adult Health Nurse Practitioner

## 2020-06-19 DIAGNOSIS — F411 Generalized anxiety disorder: Secondary | ICD-10-CM

## 2020-07-04 ENCOUNTER — Other Ambulatory Visit: Payer: Self-pay | Admitting: Adult Health Nurse Practitioner

## 2020-07-04 DIAGNOSIS — R7309 Other abnormal glucose: Secondary | ICD-10-CM

## 2020-08-02 ENCOUNTER — Other Ambulatory Visit: Payer: Self-pay | Admitting: Internal Medicine

## 2020-08-02 ENCOUNTER — Other Ambulatory Visit: Payer: Self-pay

## 2020-08-02 ENCOUNTER — Ambulatory Visit (INDEPENDENT_AMBULATORY_CARE_PROVIDER_SITE_OTHER): Payer: 59 | Admitting: Internal Medicine

## 2020-08-02 ENCOUNTER — Encounter: Payer: Self-pay | Admitting: Internal Medicine

## 2020-08-02 VITALS — BP 120/70 | HR 64 | Temp 97.9°F | Resp 16 | Ht 65.0 in | Wt 268.0 lb

## 2020-08-02 DIAGNOSIS — R202 Paresthesia of skin: Secondary | ICD-10-CM | POA: Diagnosis not present

## 2020-08-02 MED ORDER — PROMETHAZINE HCL 25 MG PO TABS: 25.0000 mg | ORAL_TABLET | Freq: Four times a day (QID) | ORAL | 0 refills | Status: DC | PRN

## 2020-08-02 NOTE — Progress Notes (Signed)
    Future Appointments  Date Time Provider [place]  08/02/2020 11:00 AM Unk Pinto, MD [facility] None  05/26/2021  2:00 PM Garnet Sierras, NP [facility] None    History of Present Illness:      Patient is a 55 yo WF with HTN, HLD, , Morbid Obesity (BMI 46+), Insulin Resistance, Vit B12 Deficiency ("360" in Apr) and Vit D Deficiency presents with c/o paresthesias of her hands when she has her arm in certain positions during recumbency. She has had multiple normal Vitamin B12 levels in the past. She does have one borderline A1c 7.7% in 2017 and she has had 5 normal A1c  levels less than 5.5% in the last 5 years.  She denies any known exposure to metals or industrial chemicals or k/o tick bites.     Medications     etonogestrel (NEXPLANON) 68 MG IMPL implant, 1 each by Subdermal route once.    Semaglutide,0.25 or 0.5MG /DOS, (OZEMPIC, 0.25 OR 0.5 MG/DOSE,) 2 MG/1.5ML SOPN, DIAL AND INJECT UNDER THE SKIN 0.5 MG WEEKLY    amLODipine (NORVASC) 5 MG tablet, Take 1 tablet  daily.   rosuvastatin (CRESTOR) 10 MG tablet, Take   1 tablet   Daily for Cholesterol     ALPRAZolam 0.5 MG tablet, Take  1/2 to 1 tablet  at  Bedtime  ONLY  if needed     escitalopram  20 MG tablet, TAKE ONE TABLET Daily    MAGNESIUM, Take  as needed.     valACYclovir (VALTREX) 500 MG tablet, TAKE 1-2  TABS DAILY FOR FEVER BLISTER PROPHYLAXIS   Vitamin D 50,000 u Take 1 capsule 1 x  /week for severe Vitamin D  Deficiency  Problem list She has Hypercholesteremia; Hypertension; HSV-2 infection; Primary localized osteoarthrosis, lower leg; Obesity; Insulin resistance; OSA (obstructive sleep apnea); Vitamin D deficiency; Medication management; Anemia; Depression, major, recurrent, in partial remission ([place]); Cervical radiculopathy at C6; Hip flexor tightness; and Pes anserine bursitis on their problem list.   Observations/Objective:  BP 120/70  Pulse 64   Temp 97.9 F (36.6 C)   Resp 16   Ht 5\' 5"   (1.651 m)   Wt 268 lb (121.6 kg)   SpO2 98%   BMI 44.60 kg/m   HEENT - WNL. Neck - supple.  Chest - Clear equal BS. Cor - Nl HS. RRR w/o sig MGR. PP 1(+). No edema. MS- FROM w/o deformities.  Gait Nl. DTR's Nl/equal Neuro -  Nl w/o focal abnormalities.Sensation intact to                 touch, vibratory and Monofilament to the toes bilaterally.   Assessment and Plan:   1. Paresthesias  Patient deferred referral for PNCV's at present    Follow Up Instructions:        I discussed the assessment and treatment plan with the patient. The patient was provided an opportunity to ask questions and all were answered. The patient agreed with the plan and demonstrated an understanding of the instructions.        The patient was advised to call back or seek an in-person evaluation if the symptoms worsen or if the condition fails to improve as anticipated.    Kirtland Bouchard, MD

## 2020-09-06 ENCOUNTER — Other Ambulatory Visit: Payer: Self-pay

## 2020-09-06 DIAGNOSIS — R7309 Other abnormal glucose: Secondary | ICD-10-CM

## 2020-09-06 MED ORDER — OZEMPIC (0.25 OR 0.5 MG/DOSE) 2 MG/1.5ML ~~LOC~~ SOPN: PEN_INJECTOR | SUBCUTANEOUS | 0 refills | Status: DC

## 2020-09-17 ENCOUNTER — Other Ambulatory Visit: Payer: Self-pay | Admitting: Internal Medicine

## 2020-09-17 DIAGNOSIS — E782 Mixed hyperlipidemia: Secondary | ICD-10-CM

## 2020-09-17 MED ORDER — ROSUVASTATIN CALCIUM 10 MG PO TABS: ORAL_TABLET | ORAL | 3 refills | Status: DC

## 2020-09-25 ENCOUNTER — Other Ambulatory Visit: Payer: Self-pay | Admitting: Internal Medicine

## 2020-09-25 MED ORDER — MOUNJARO 7.5 MG/0.5ML ~~LOC~~ SOAJ: SUBCUTANEOUS | 0 refills | Status: DC

## 2020-10-22 ENCOUNTER — Other Ambulatory Visit: Payer: Self-pay | Admitting: Internal Medicine

## 2020-10-22 DIAGNOSIS — R7309 Other abnormal glucose: Secondary | ICD-10-CM

## 2020-10-22 DIAGNOSIS — E559 Vitamin D deficiency, unspecified: Secondary | ICD-10-CM

## 2020-10-22 DIAGNOSIS — R7301 Impaired fasting glucose: Secondary | ICD-10-CM

## 2020-10-22 DIAGNOSIS — E8881 Metabolic syndrome: Secondary | ICD-10-CM

## 2020-10-22 MED ORDER — TIRZEPATIDE 10 MG/0.5ML ~~LOC~~ SOAJ
SUBCUTANEOUS | 0 refills | Status: DC
Start: 2020-10-22 — End: 2020-10-24

## 2020-10-22 MED ORDER — VITAMIN D (ERGOCALCIFEROL) 1.25 MG (50000 UNIT) PO CAPS: ORAL_CAPSULE | ORAL | 3 refills | Status: DC

## 2020-10-24 ENCOUNTER — Other Ambulatory Visit: Payer: Self-pay | Admitting: Internal Medicine

## 2020-10-24 DIAGNOSIS — R7301 Impaired fasting glucose: Secondary | ICD-10-CM

## 2020-10-24 DIAGNOSIS — R7309 Other abnormal glucose: Secondary | ICD-10-CM

## 2020-10-24 DIAGNOSIS — E8881 Metabolic syndrome: Secondary | ICD-10-CM

## 2020-10-24 DIAGNOSIS — E119 Type 2 diabetes mellitus without complications: Secondary | ICD-10-CM

## 2020-10-24 MED ORDER — MOUNJARO 2.5 MG/0.5ML ~~LOC~~ SOAJ: 2.5000 mg | SUBCUTANEOUS | 2 refills | Status: DC

## 2020-11-01 ENCOUNTER — Other Ambulatory Visit: Payer: Self-pay | Admitting: Internal Medicine

## 2020-11-04 ENCOUNTER — Other Ambulatory Visit: Payer: Self-pay | Admitting: Internal Medicine

## 2020-11-04 DIAGNOSIS — E119 Type 2 diabetes mellitus without complications: Secondary | ICD-10-CM

## 2020-11-04 DIAGNOSIS — R7301 Impaired fasting glucose: Secondary | ICD-10-CM

## 2020-11-04 DIAGNOSIS — R7309 Other abnormal glucose: Secondary | ICD-10-CM

## 2020-11-04 DIAGNOSIS — E8881 Metabolic syndrome: Secondary | ICD-10-CM

## 2020-11-04 MED ORDER — MOUNJARO 2.5 MG/0.5ML ~~LOC~~ SOAJ: 2.5000 mg | SUBCUTANEOUS | 2 refills | Status: DC

## 2020-11-11 ENCOUNTER — Ambulatory Visit (INDEPENDENT_AMBULATORY_CARE_PROVIDER_SITE_OTHER): Payer: 59 | Admitting: Sports Medicine

## 2020-11-11 ENCOUNTER — Other Ambulatory Visit: Payer: Self-pay

## 2020-11-11 ENCOUNTER — Ambulatory Visit: Payer: 59 | Admitting: Nurse Practitioner

## 2020-11-11 VITALS — BP 152/94 | HR 80 | Ht 65.0 in | Wt 255.0 lb

## 2020-11-11 DIAGNOSIS — S46812A Strain of other muscles, fascia and tendons at shoulder and upper arm level, left arm, initial encounter: Secondary | ICD-10-CM

## 2020-11-11 MED ORDER — CYCLOBENZAPRINE HCL 5 MG PO TABS: 5.0000 mg | ORAL_TABLET | Freq: Every evening | ORAL | 0 refills | Status: DC | PRN

## 2020-11-11 NOTE — Progress Notes (Signed)
Wendy Levine D.Wendy Levine Wendy Levine Phone: [phone]   Assessment and Plan:     1. 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  Patient's BUN, creatinine, GFR were all WNL on lab work 5 months ago.  Advise she may use NSAIDs as needed for pain control, but would not use chronically or regularly - Start HEP focused on shoulder ROM to prevent frozen shoulder - Activity as tolerated.  Ice/heat as needed  Other orders - cyclobenzaprine (FLEXERIL) 5 MG tablet; Take 1 tablet (5 mg total) by mouth at bedtime as needed for muscle spasms.    Pertinent previous records reviewed include lab work from 5 months ago including CMP   Follow Up: In 4 weeks.  Could consider trigger point injections versus x-ray versus PT at that time   Subjective:   I, Wendy Levine, am serving as a scribe for Dr. Glennon Mac, DO.  Chief Complaint: Left trap pain and swelling.   HPI:   11/11/20 Weight training on 11/06/2020 pain began 2 days later. Ice doesn't help. Pain is characterized as sharp in the left trap, but dull across the back of the neck. It affects the ROM of her left shoulder. Patient states pain is 7/10.  She feels that she has had swelling superior to her clavicle.  Denies night sweats, chest pain, unintentional weight loss.  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   Current Outpatient Medications:    cyclobenzaprine (FLEXERIL) 5 MG tablet, Take 1 tablet (5 mg total) by mouth at bedtime as needed for muscle spasms., Disp: 10  tablet, Rfl: 0   amLODipine (NORVASC) 5 MG tablet, Take 1 tablet (5 mg total) by mouth daily., Disp: 30 tablet, Rfl: 11   escitalopram (LEXAPRO) 20 MG tablet, TAKE ONE TABLET BY MOUTH DAILY FOR MOOD, Disp: 90 tablet, Rfl: 3   etonogestrel (NEXPLANON) 68 MG IMPL implant, 1 each by Subdermal route once., Disp: , Rfl:    MAGNESIUM PO, Take by mouth as needed. Reported on 06/27/2015, Disp: , Rfl:    promethazine (PHENERGAN) 25 MG tablet, Take 1 tablet (25 mg total) by mouth every 6 (six) hours as needed for nausea or vomiting. 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    Physical Exam:    Gen: Appears well, nad, nontoxic and pleasant Neuro:sensation intact, strength is 5/5 with df/pf/inv/ev, muscle tone wnl Skin: no suspicious lesion or defmority.  Generalized soft tissue swelling superior to left clavicle that is nontender, nonerythematous.  No lymphadenopathy Psych: A&O, appropriate mood and affect  Shoulder: no deformity, swelling or muscle wasting No scapular winging FF 180, abd 180, int 10, ext 90 TTP left trapezius NTTP over the [place], clavicle, ac, coracoid, biceps groove, humerus, deltoid, subacromial space, scap spine, cervical  spine Neg neer, hawkings, empty can, subscap liftoff, speeds, obriens Neg ant drawer, sulcus sign Neg apprehension Negative Spurling's test bilat FROM of neck    Electronically signed by:  Wendy Levine D.[facility] 11:45 AM 11/11/20

## 2020-11-11 NOTE — Telephone Encounter (Cosign Needed)
Patient called the office and is being seen by another office.

## 2020-11-11 NOTE — Patient Instructions (Addendum)
Do prescribed exercises at least 3x a week Flexeril '5mg'$  at night as needed Ice and need as needed Ibuprofen for pain control See you again in 4 weeks

## 2020-11-12 ENCOUNTER — Encounter: Payer: Self-pay | Admitting: Sports Medicine

## 2020-11-13 ENCOUNTER — Encounter: Payer: Self-pay | Admitting: Sports Medicine

## 2020-11-18 ENCOUNTER — Ambulatory Visit (INDEPENDENT_AMBULATORY_CARE_PROVIDER_SITE_OTHER): Payer: 59 | Admitting: Nurse Practitioner

## 2020-11-18 ENCOUNTER — Other Ambulatory Visit: Payer: Self-pay

## 2020-11-18 ENCOUNTER — Encounter: Payer: Self-pay | Admitting: Nurse Practitioner

## 2020-11-18 VITALS — BP 150/90 | HR 86 | Temp 97.9°F | Wt 253.6 lb

## 2020-11-18 DIAGNOSIS — Z6841 Body Mass Index (BMI) 40.0 and over, adult: Secondary | ICD-10-CM | POA: Diagnosis not present

## 2020-11-18 DIAGNOSIS — E119 Type 2 diabetes mellitus without complications: Secondary | ICD-10-CM | POA: Diagnosis not present

## 2020-11-18 DIAGNOSIS — M62838 Other muscle spasm: Secondary | ICD-10-CM

## 2020-11-18 MED ORDER — OFLOXACIN 0.3 % OP SOLN: 1.0000 [drp] | Freq: Four times a day (QID) | OPHTHALMIC | 0 refills | Status: DC

## 2020-11-18 NOTE — Patient Instructions (Signed)
Bacterial Conjunctivitis, Adult ?Bacterial conjunctivitis is an infection of the clear membrane that covers the white part of the eye and the inner surface of the eyelid (conjunctiva). When the blood vessels in the conjunctiva become inflamed, the eye becomes red or pink. The eye often feels irritated or itchy. Bacterial conjunctivitis spreads easily from person to person (is contagious). It also spreads easily from one eye to the other eye. ?What are the causes? ?This condition is caused by bacteria. You may get the infection if you come into close contact with: ?A person who is infected with the bacteria. ?Items that are contaminated with the bacteria, such as a face towel, contact lens solution, or eye makeup. ?What increases the risk? ?You are more likely to develop this condition if: ?You are exposed to other people who have the infection. ?You wear contact lenses. ?You have a sinus infection. ?You have had a recent eye injury or surgery. ?You have a weak body defense system (immune system). ?You have a medical condition that causes dry eyes. ?What are the signs or symptoms? ?Symptoms of this condition include: ?Thick, yellowish discharge from the eye. This may turn into a crust on the eyelid overnight and cause your eyelids to stick together. ?Tearing or watery eyes. ?Itchy eyes. ?Burning feeling in your eyes. ?Eye redness. ?Swollen eyelids. ?Blurred vision. ?How is this diagnosed? ?This condition is diagnosed based on your symptoms and medical history. Your health care provider may also take a sample of discharge from your eye to find the cause of your infection. ?How is this treated? ?This condition may be treated with: ?Antibiotic eye drops or ointment to clear the infection more quickly and prevent the spread of infection to others. ?Antibiotic medicines taken by mouth (orally) to treat infections that do not respond to drops or ointments or that last longer than 10 days. ?Cool, wet cloths (cool  compresses) placed on the eyes. ?Artificial tears applied 2-6 times a day. ?Follow these instructions at home: ?Medicines ?Take or apply your antibiotic medicine as told by your health care provider. Do not stop using the antibiotic, even if your condition improves, unless directed by your health care provider. ?Take or apply over-the-counter and prescription medicines only as told by your health care provider. ?Be very careful to avoid touching the edge of your eyelid with the eye-drop bottle or the ointment tube when you apply medicines to the affected eye. This will keep you from spreading the infection to your other eye or to other people. ?Managing discomfort ?Gently wipe away any drainage from your eye with a warm, wet washcloth or a cotton ball. ?Apply a clean, cool compress to your eye for 10-20 minutes, 3-4 times a day. ?General instructions ?Do not wear contact lenses until the inflammation is gone and your health care provider says it is safe to wear them again. Ask your health care provider how to sterilize or replace your contact lenses before you use them again. Wear glasses until you can resume wearing contact lenses. ?Avoid wearing eye makeup until the inflammation is gone. Throw away any old eye cosmetics that may be contaminated. ?Change or wash your pillowcase every day. ?Do not share towels or washcloths. This may spread the infection. ?Wash your hands often with soap and water for at least 20 seconds and especially before touching your face or eyes. Use paper towels to dry your hands. ?Avoid touching or rubbing your eyes. ?Do not drive or use heavy machinery if your vision is blurred. ?Contact   a health care provider if: You have a fever. Your symptoms do not get better after 10 days. Get help right away if: You have a fever and your symptoms suddenly get worse. You have severe pain when you move your eye. You have facial pain, redness, or swelling. You have a sudden loss of  vision. Summary Bacterial conjunctivitis is an infection of the clear membrane that covers the white part of the eye and the inner surface of the eyelid (conjunctiva). Bacterial conjunctivitis spreads easily from eye to eye and from person to person (is contagious). Wash your hands often with soap and water for at least 20 seconds and especially before touching your face or eyes. Use paper towels to dry your hands. Take or apply your antibiotic medicine as told by your health care provider. Do not stop using the antibiotic even if your condition improves. Contact a health care provider if you have a fever or if your symptoms do not get better after 10 days. Get help right away if you have a sudden loss of vision. This information is not intended to replace advice given to you by your health care provider. Make sure you discuss any questions you have with your health care provider. Document Revised: 05/22/2020 Document Reviewed: 05/22/2020 Elsevier Patient Education  [place].

## 2020-11-18 NOTE — Progress Notes (Signed)
Assessment and Plan:  Wendy Levine was seen today for acute visit.  Diagnoses and all orders for this visit:  Morbid obesity with BMI of 45.0-49.9, adult (HCC) -     ofloxacin (OCUFLOX) 0.3 % ophthalmic solution; Place 1 drop into both eyes 4 (four) times daily. - Can be contagious.  Avoid sharing towels or washcloths.  Wash hands frequently  Diabetes mellitus without complication ([place])  Pt will try Mounjaro, savings card is given to pt to use at pharmacy.   Trapezius muscle spasm   Trigger point injection to left trapezius in 2 areas with immediate relief..  Monitor symptoms    Further disposition pending results of labs. Discussed med's effects and SE's.   Over 30 minutes of exam, counseling, chart review, and critical decision making was performed.   Future Appointments  Date Time Provider [place]  12/09/2020 10:00 AM Glennon Mac, DO [facility] None  05/26/2021  2:00 PM Magda Bernheim, NP [facility] None    ------------------------------------------------------------------------------------------------------------------   HPI BP (!) 150/90   Pulse 86   Temp 97.9 F (36.6 C)   Wt 253 lb 9.6 oz (115 kg)   SpO2 95%   BMI 42.20 kg/m  55 y.o.female presents for eye pain and discharge. 3 days ago started having left eye irritation and some crusting of the eye. Cold compresses improve pain but not swelling.  Used Systane drops with no change in symptoms.  Does not remember any irritant getting into the eye.  Left TM joint painful to touch. No pain with chewing. Denies fever, myalgias.   Pain in left trapezius muscle which has been off and on, was lifting weights 12 days ago and pain in shoulder started 2 days later. Denies numbness/tingling down arm, no change in strength.  Past Medical History:  Diagnosis Date   Allergy    Anemia    Anxiety    ASCUS (atypical squamous cells of undetermined significance) on Pap smear    neg HR HPV 09/2008, ascus 06/2009,10/2009   Fatty  liver 08/2018   ultrasound   Fatty liver    HSV-2 infection    Hypercholesteremia    Hypertension    OSA on CPAP    Pes planus of both feet 06/20/2013   Prediabetes    Sleep apnea      Allergies  Allergen Reactions   Doxycycline Hives and Shortness Of Breath   Ace Inhibitors     cough   Adhesive [Tape]    Augmentin [Amoxicillin-Pot Clavulanate] Nausea Only    diarrhea   Clindamycin/Lincomycin    Epinephrine-Lidocaine-Na Metabisulfite [Lidocaine-Epinephrine]     Has heart palpitations from the epi, wants without   Levaquin [Levofloxacin] Other (See Comments)    Dysphoria   Vicodin [Hydrocodone-Acetaminophen] Itching    Current Outpatient Medications on File Prior to Visit  Medication Sig   amLODipine (NORVASC) 5 MG tablet Take 1 tablet (5 mg total) by mouth daily.   Cholecalciferol (VITAMIN D3) 50 MCG (2000 UT) TABS Take by mouth.   cyclobenzaprine (FLEXERIL) 5 MG tablet Take 1 tablet (5 mg total) by mouth at bedtime as needed for muscle spasms.   escitalopram (LEXAPRO) 20 MG tablet TAKE ONE TABLET BY MOUTH DAILY FOR MOOD   etonogestrel (NEXPLANON) 68 MG IMPL implant 1 each by Subdermal route once.   fluticasone (FLONASE) 50 MCG/ACT nasal spray Place into both nostrils daily.   ibuprofen (ADVIL) 200 MG tablet Take 200 mg by mouth every 6 (six) hours as needed.   MAGNESIUM PO  Take by mouth as needed. Reported on 06/27/2015   promethazine (PHENERGAN) 25 MG tablet Take 1 tablet (25 mg total) by mouth every 6 (six) hours as needed for nausea or vomiting. New Script - Sig: Take 1 to 2 tablets at Bedtime as needed for Nausea or Sleep   Propylene Glycol (SYSTANE BALANCE OP) Apply to eye.   rosuvastatin (CRESTOR) 10 MG tablet Take  1 tablet   Daily for Cholesterol / Patient knows to take by mouth   Turmeric (QC TUMERIC COMPLEX PO) Take by mouth.   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 (Patient not taking: Reported on 11/18/2020)   Vitamin D, Ergocalciferol, (DRISDOL) 1.25 MG (50000 UNIT) CAPS capsule Take 1 capsule 1 x  /week for severe Vitamin D  Deficiency   No current facility-administered medications on file prior to visit.    ROS: all negative except above.   Physical Exam:  BP (!) 150/90   Pulse 86   Temp 97.9 F (36.6 C)   Wt 253 lb 9.6 oz (115 kg)   SpO2 95%   BMI 42.20 kg/m   General Appearance: Well nourished, in no apparent distress. Eyes: PERRLA, EOMs, left conjunctiva erythematous with purulent drainage of left eye noted on lateral side. No foreign body noted.  Right eye lateral corner of sclera beginning erythema no drainage Sinuses: No Frontal/maxillary tenderness ENT/Mouth: Ext aud canals clear, TMs without erythema, bulging. No erythema, swelling, or exudate on post pharynx.  Tonsils not swollen or erythematous. Hearing normal.  Neck: Supple, thyroid normal.  Respiratory: Respiratory effort normal, BS equal bilaterally without rales, rhonchi, wheezing or stridor.  Cardio: RRR with no MRGs. Brisk peripheral pulses without edema.  Abdomen: Soft, + BS.  Non tender, no guarding, rebound, hernias, masses. Lymphatics: Non tender without lymphadenopathy.  Musculoskeletal: Full ROM, 5/5 strength, normal gait. Muscle spasm of left trapezius Skin: Warm, dry without rashes, lesions, ecchymosis.  Neuro: Cranial nerves intact. Normal muscle tone, no cerebellar symptoms. Sensation intact.  Psych: Awake and oriented X 3, normal affect, Insight and Judgment appropriate.   A trigger point injection was performed at the site of maximal tenderness  on the left trapezius muscle using 1% plain Lidocaine and dexamethasone. This was well tolerated, and followed by immediate relief of pain.     Magda Bernheim, NP 2:40 PM [facility]

## 2020-11-25 ENCOUNTER — Other Ambulatory Visit: Payer: Self-pay

## 2020-11-25 ENCOUNTER — Ambulatory Visit (INDEPENDENT_AMBULATORY_CARE_PROVIDER_SITE_OTHER): Payer: 59

## 2020-11-25 VITALS — Temp 97.7°F

## 2020-11-25 DIAGNOSIS — Z23 Encounter for immunization: Secondary | ICD-10-CM | POA: Diagnosis not present

## 2020-11-25 NOTE — Progress Notes (Signed)
Patient presents for a Nurse Visit today to receive a flu shot. Flu shot given in left deltoid and tolerated well.

## 2020-12-05 ENCOUNTER — Other Ambulatory Visit: Payer: Self-pay | Admitting: Adult Health Nurse Practitioner

## 2020-12-05 DIAGNOSIS — I10 Essential (primary) hypertension: Secondary | ICD-10-CM

## 2020-12-09 ENCOUNTER — Ambulatory Visit: Payer: 59 | Admitting: Sports Medicine

## 2020-12-11 ENCOUNTER — Other Ambulatory Visit: Payer: Self-pay | Admitting: Nurse Practitioner

## 2020-12-11 DIAGNOSIS — E559 Vitamin D deficiency, unspecified: Secondary | ICD-10-CM

## 2020-12-11 DIAGNOSIS — F411 Generalized anxiety disorder: Secondary | ICD-10-CM

## 2020-12-11 MED ORDER — VITAMIN D (ERGOCALCIFEROL) 1.25 MG (50000 UNIT) PO CAPS: ORAL_CAPSULE | ORAL | 3 refills | Status: DC

## 2020-12-11 MED ORDER — BUSPIRONE HCL 5 MG PO TABS: 5.0000 mg | ORAL_TABLET | Freq: Three times a day (TID) | ORAL | 2 refills | Status: DC

## 2020-12-17 ENCOUNTER — Encounter: Payer: Self-pay | Admitting: Nurse Practitioner

## 2020-12-17 ENCOUNTER — Ambulatory Visit (INDEPENDENT_AMBULATORY_CARE_PROVIDER_SITE_OTHER): Payer: 59 | Admitting: Nurse Practitioner

## 2020-12-17 ENCOUNTER — Other Ambulatory Visit: Payer: Self-pay

## 2020-12-17 VITALS — BP 150/80 | HR 72 | Temp 97.7°F | Wt 248.6 lb

## 2020-12-17 DIAGNOSIS — R221 Localized swelling, mass and lump, neck: Secondary | ICD-10-CM | POA: Diagnosis not present

## 2020-12-17 DIAGNOSIS — K5900 Constipation, unspecified: Secondary | ICD-10-CM | POA: Diagnosis not present

## 2020-12-17 DIAGNOSIS — M79672 Pain in left foot: Secondary | ICD-10-CM | POA: Diagnosis not present

## 2020-12-17 NOTE — Progress Notes (Signed)
Assessment and Plan:  Evelina was seen today for acute visit.  Diagnoses and all orders for this visit:  Constipation, unspecified constipation type Continue Linzess prn Push fluids and increase fiber  Foot pain, left -     DG Foot Complete Left; Future Rest foot and monitor symptoms, will treat pending results  Neck swelling/Lump in neck -     US SOFT TISSUE HEAD & NECK (NON-THYROID); Future Will await results to determine further plan of care for area      Further disposition pending results of labs. Discussed med's effects and SE's.   Over 20 minutes of exam, counseling, chart review, and critical decision making was performed.   Future Appointments  Date Time Provider Branch  05/26/2021  2:00 PM Magda Bernheim, NP [facility] None    ------------------------------------------------------------------------------------------------------------------   HPI BP (!) 150/80   Pulse 72   Temp 97.7 F (36.5 C)   Wt 248 lb 9.6 oz (112.8 kg)   SpO2 97%   BMI 41.37 kg/m  55 y.o.female presents for pain in left foot  Approximately 2 weeks ago she started having pain in outer part of foot. Describes the pain as a burning and does not radiate. Worse with movement. Does not remember injuring her foot.   Left side of neck has swollen area that has not resolved with steroid injection. Swelling has increased since 11/13/20  Past Medical History:  Diagnosis Date   Allergy    Anemia    Anxiety    ASCUS (atypical squamous cells of undetermined significance) on Pap smear    neg HR HPV 09/2008, ascus 06/2009,10/2009   Fatty liver 08/2018   ultrasound   Fatty liver    HSV-2 infection    Hypercholesteremia    Hypertension    OSA on CPAP    Pes planus of both feet 06/20/2013   Prediabetes    Sleep apnea      Allergies  Allergen Reactions   Doxycycline Hives and Shortness Of Breath   Ace Inhibitors     cough   Adhesive [Tape]    Augmentin [Amoxicillin-Pot Clavulanate]  Nausea Only    diarrhea   Clindamycin/Lincomycin    Epinephrine-Lidocaine-Na Metabisulfite [Lidocaine-Epinephrine]     Has heart palpitations from the epi, wants without   Levaquin [Levofloxacin] Other (See Comments)    Dysphoria   Vicodin [Hydrocodone-Acetaminophen] Itching    Current Outpatient Medications on File Prior to Visit  Medication Sig   amLODipine (NORVASC) 5 MG tablet TAKE 1 TABLET(5 MG) BY MOUTH DAILY   busPIRone (BUSPAR) 5 MG tablet Take 1 tablet (5 mg total) by mouth 3 (three) times daily.   etonogestrel (NEXPLANON) 68 MG IMPL implant 1 each by Subdermal route once.   fluticasone (FLONASE) 50 MCG/ACT nasal spray Place into both nostrils daily.   ibuprofen (ADVIL) 200 MG tablet Take 200 mg by mouth every 6 (six) hours as needed.   MAGNESIUM PO Take by mouth as needed. Reported on 06/27/2015   phentermine 37.5 MG capsule Take 37.5 mg by mouth every morning. prn   promethazine (PHENERGAN) 25 MG tablet Take 1 tablet (25 mg total) by mouth every 6 (six) hours as needed for nausea or vomiting. New Script - Sig: Take 1 to 2 tablets at Bedtime as needed for Nausea or Sleep   rosuvastatin (CRESTOR) 10 MG tablet Take  1 tablet   Daily for Cholesterol / Patient knows to take by mouth   tirzepatide (MOUNJARO) 2.5 MG/0.5ML Pen Inject 2.5 mg  into the skin once a week.   traZODone (DESYREL) 50 MG tablet Take 50 mg by mouth at bedtime. prn   Turmeric (QC TUMERIC COMPLEX PO) Take by mouth.   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 (Patient not taking: Reported on 12/17/2020)   cyclobenzaprine (FLEXERIL) 5 MG tablet Take 1 tablet (5 mg total) by mouth at bedtime as needed for muscle spasms. (Patient not taking: Reported on  12/17/2020)   escitalopram (LEXAPRO) 20 MG tablet TAKE ONE TABLET BY MOUTH DAILY FOR MOOD   ofloxacin (OCUFLOX) 0.3 % ophthalmic solution Place 1 drop into both eyes 4 (four) times daily. (Patient not taking: Reported on 12/17/2020)   Propylene Glycol (SYSTANE BALANCE OP) Apply to eye. (Patient not taking: Reported on 12/17/2020)   No current facility-administered medications on file prior to visit.    ROS: all negative except above.   Physical Exam:  BP (!) 150/80   Pulse 72   Temp 97.7 F (36.5 C)   Wt 248 lb 9.6 oz (112.8 kg)   SpO2 97%   BMI 41.37 kg/m   General Appearance: Well nourished, in no apparent distress. Eyes: PERRLA, EOMs, conjunctiva no swelling or erythema Sinuses: No Frontal/maxillary tenderness ENT/Mouth: Ext aud canals clear, TMs without erythema, bulging. No erythema, swelling, or exudate on post pharynx.  Tonsils not swollen or erythematous. Hearing normal.  Neck: Supple, thyroid normal. Swollen area at base of neck on left side - approx 3 cm and tender to touch Respiratory: Respiratory effort normal, BS equal bilaterally without rales, rhonchi, wheezing or stridor.  Cardio: RRR with no MRGs. Brisk peripheral pulses without edema.  Abdomen: Soft, + BS.  Non tender, no guarding, rebound, hernias, masses. Lymphatics: Non tender without lymphadenopathy.  Musculoskeletal: Full ROM, 5/5 strength, left foot lateral side has very tender area to palpation halfway down the foot Skin: Warm, dry without rashes, lesions, ecchymosis.  Neuro: Cranial nerves intact. Normal muscle tone, no cerebellar symptoms. Sensation intact.  Psych: Awake and oriented X 3, normal affect, Insight and Judgment appropriate.     Magda Bernheim, NP 3:31 PM [facility]

## 2020-12-17 NOTE — Addendum Note (Signed)
Addended by: Magda Bernheim on: 12/17/2020 04:18 PM   Modules accepted: Orders

## 2020-12-17 NOTE — Patient Instructions (Signed)
YOU CAN CALL TO MAKE AN ULTRASOUND.. ? ?I have put in an order for an ultrasound for you to have ?You can set them up at your convenience by calling this number  ?[phone] ?You will likely have the ultrasound at [address] ? ?If you have any issues call our office and we will set this up for you.   ?

## 2020-12-18 ENCOUNTER — Other Ambulatory Visit: Payer: 59

## 2020-12-18 ENCOUNTER — Ambulatory Visit
Admission: RE | Admit: 2020-12-18 | Discharge: 2020-12-18 | Disposition: A | Discharge status: Home / Self Care | Payer: 59 | Source: Ambulatory Visit | Attending: Nurse Practitioner | Admitting: Nurse Practitioner

## 2020-12-18 DIAGNOSIS — M79672 Pain in left foot: Secondary | ICD-10-CM

## 2020-12-18 DIAGNOSIS — R221 Localized swelling, mass and lump, neck: Secondary | ICD-10-CM

## 2020-12-25 ENCOUNTER — Other Ambulatory Visit: Payer: Self-pay | Admitting: Nurse Practitioner

## 2020-12-25 DIAGNOSIS — M79672 Pain in left foot: Secondary | ICD-10-CM

## 2020-12-26 ENCOUNTER — Other Ambulatory Visit: Payer: Self-pay | Admitting: Nurse Practitioner

## 2020-12-26 DIAGNOSIS — R110 Nausea: Secondary | ICD-10-CM

## 2020-12-26 DIAGNOSIS — E119 Type 2 diabetes mellitus without complications: Secondary | ICD-10-CM

## 2020-12-26 MED ORDER — MOUNJARO 5 MG/0.5ML ~~LOC~~ SOAJ: 5.0000 mg | SUBCUTANEOUS | 2 refills | Status: DC

## 2020-12-26 MED ORDER — PROMETHAZINE HCL 25 MG PO TABS: 25.0000 mg | ORAL_TABLET | Freq: Four times a day (QID) | ORAL | 0 refills | Status: DC | PRN

## 2021-01-06 ENCOUNTER — Other Ambulatory Visit: Payer: Self-pay | Admitting: Nurse Practitioner

## 2021-01-06 MED ORDER — ALPRAZOLAM 0.5 MG PO TABS: 0.5000 mg | ORAL_TABLET | Freq: Every evening | ORAL | 0 refills | Status: DC | PRN

## 2021-02-12 ENCOUNTER — Other Ambulatory Visit: Payer: Self-pay | Admitting: Nurse Practitioner

## 2021-02-12 NOTE — Progress Notes (Signed)
Assessment and Plan:  Wendy Levine was seen today for acute visit.  Diagnoses and all orders for this visit:  Moderate episode of recurrent major depressive disorder (HCC) -     buPROPion (WELLBUTRIN XL) 150 MG 24 hr tablet; Take 1 tablet (150 mg total) by mouth every morning. Continue Lexapro Practice good sleep hygiene Increase activity  Generalized anxiety disorder Long discussion on making time for herself, need to have help with caregiving of her mother who has dementia and is an alcoholic Recommend finding therapist Continue Xanax as needed Follow up in 1 month for reevaluation   Muscle cramps Start MVI Continue Magnesium Stressed importance of adequate fluid intake   Further disposition pending results of labs. Discussed med's effects and SE's.   Over 30 minutes of exam, counseling, chart review, and critical decision making was performed.   Future Appointments  Date Time Provider [place]  03/17/2021  2:30 PM Magda Bernheim, NP [facility] None  05/26/2021  2:00 PM Magda Bernheim, NP [facility] None    ------------------------------------------------------------------------------------------------------------------   HPI BP (!) 144/84    Pulse 90    Temp 97.7 F (36.5 C)    Wt 235 lb 9.6 oz (106.9 kg)    SpO2 97%    BMI 39.21 kg/m  55 y.o.female presents  She is currently on Lexapro daily and xanax as needed.    BMI is Body mass index is 39.21 kg/m., she has been working on diet and exercise. Wt Readings from Last 3 Encounters:  02/13/21 235 lb 9.6 oz (106.9 kg)  12/17/20 248 lb 9.6 oz (112.8 kg)  11/18/20 253 lb 9.6 oz (115 kg)   She is also noticing carmping of her legs throughout the day and twitching of her left eye.  This only started since starting Ozempic. Has been eating less and drinking less fluids.  Has been taking Magnesium with no real relief of cramps  She is noticing speech issues which began approximately 3-4 years ago- she is flipping the  syllables in the word. Can occasionally say the wrong word.  Also is noticing difficulty remembering thoughts  Can't finish sentences. She does notice that her mom is causing her increased anxiety. Cares for her 24/7 who is an alcoholic and has memory issues.  Past Medical History:  Diagnosis Date   Allergy    Anemia    Anxiety    ASCUS (atypical squamous cells of undetermined significance) on Pap smear    neg HR HPV 09/2008, ascus 06/2009,10/2009   Fatty liver 08/2018   ultrasound   Fatty liver    HSV-2 infection    Hypercholesteremia    Hypertension    OSA on CPAP    Pes planus of both feet 06/20/2013   Prediabetes    Sleep apnea      Allergies  Allergen Reactions   Doxycycline Hives and Shortness Of Breath   Ace Inhibitors     cough   Adhesive [Tape]    Augmentin [Amoxicillin-Pot Clavulanate] Nausea Only    diarrhea   Clindamycin/Lincomycin    Epinephrine-Lidocaine-Na Metabisulfite [Lidocaine-Epinephrine]     Has heart palpitations from the epi, wants without   Levaquin [Levofloxacin] Other (See Comments)    Dysphoria   Vicodin [Hydrocodone-Acetaminophen] Itching    Current Outpatient Medications on File Prior to Visit  Medication Sig   ALPRAZolam (XANAX) 0.5 MG tablet TAKE ONE TABLET BY MOUTH EVERY NIGHT AT BEDTIME AS NEEDED   amLODipine (NORVASC) 5 MG tablet TAKE 1 TABLET(5 MG) BY  MOUTH DAILY   escitalopram (LEXAPRO) 20 MG tablet TAKE ONE TABLET BY MOUTH DAILY FOR MOOD   etonogestrel (NEXPLANON) 68 MG IMPL implant 1 each by Subdermal route once.   fluticasone (FLONASE) 50 MCG/ACT nasal spray Place into both nostrils daily.   ibuprofen (ADVIL) 200 MG tablet Take 200 mg by mouth every 6 (six) hours as needed.   MAGNESIUM PO Take by mouth as needed. Reported on 06/27/2015   promethazine (PHENERGAN) 25 MG tablet Take 1 tablet (25 mg total) by mouth every 6 (six) hours as needed for nausea or vomiting. New Script - Sig: Take 1 to 2 tablets at Bedtime as needed for Nausea or  Sleep   rosuvastatin (CRESTOR) 10 MG tablet Take  1 tablet   Daily for Cholesterol / Patient knows to take by mouth   tirzepatide (MOUNJARO) 5 MG/0.5ML Pen Inject 5 mg into the skin once a week.   traZODone (DESYREL) 50 MG tablet Take 50 mg by mouth at bedtime. prn   Vitamin D, Ergocalciferol, (DRISDOL) 1.25 MG (50000 UNIT) CAPS capsule Take 1 capsule 1 x  /week for severe Vitamin D  Deficiency   phentermine 37.5 MG capsule Take 37.5 mg by mouth every morning. prn (Patient not taking: Reported on 02/13/2021)   Turmeric (QC TUMERIC COMPLEX PO) Take by mouth. (Patient not taking: Reported on 02/13/2021)   valACYclovir (VALTREX) 500 MG tablet TAKE ONE TO TWO TABLETS BY MOUTH DAILY FOR FEVER BLISTER PROPHYLAXIS   No current facility-administered medications on file prior to visit.    ROS: all negative except above.   Physical Exam:  BP (!) 144/84    Pulse 90    Temp 97.7 F (36.5 C)    Wt 235 lb 9.6 oz (106.9 kg)    SpO2 97%    BMI 39.21 kg/m   General Appearance: Well nourished, in no apparent distress. Eyes: PERRLA, EOMs, conjunctiva no swelling or erythema Sinuses: No Frontal/maxillary tenderness ENT/Mouth: Ext aud canals clear, TMs without erythema, bulging. No erythema, swelling, or exudate on post pharynx.  Tonsils not swollen or erythematous. Hearing normal.  Neck: Supple, thyroid normal.  Respiratory: Respiratory effort normal, BS equal bilaterally without rales, rhonchi, wheezing or stridor.  Cardio: RRR with no MRGs. Brisk peripheral pulses without edema.  Abdomen: Soft, + BS.  Non tender, no guarding, rebound, hernias, masses. Lymphatics: Non tender without lymphadenopathy.  Musculoskeletal: Full ROM, 5/5 strength, normal gait.  Skin: Warm, dry without rashes, lesions, ecchymosis.  Neuro: Cranial nerves intact. Normal muscle tone, no cerebellar symptoms. Sensation intact.  Psych: Awake and oriented X 3, normal affect, Insight and Judgment appropriate.   MMSE: 30/30  [place], NP 3:46 PM [facility]

## 2021-02-13 ENCOUNTER — Encounter: Payer: Self-pay | Admitting: Nurse Practitioner

## 2021-02-13 ENCOUNTER — Other Ambulatory Visit: Payer: Self-pay

## 2021-02-13 ENCOUNTER — Ambulatory Visit (INDEPENDENT_AMBULATORY_CARE_PROVIDER_SITE_OTHER): Payer: 59 | Admitting: Nurse Practitioner

## 2021-02-13 VITALS — BP 144/84 | HR 90 | Temp 97.7°F | Wt 235.6 lb

## 2021-02-13 DIAGNOSIS — F411 Generalized anxiety disorder: Secondary | ICD-10-CM

## 2021-02-13 DIAGNOSIS — F331 Major depressive disorder, recurrent, moderate: Secondary | ICD-10-CM

## 2021-02-13 DIAGNOSIS — R252 Cramp and spasm: Secondary | ICD-10-CM | POA: Diagnosis not present

## 2021-02-13 MED ORDER — BUPROPION HCL ER (XL) 150 MG PO TB24: 150.0000 mg | ORAL_TABLET | ORAL | 2 refills | Status: DC

## 2021-02-14 ENCOUNTER — Encounter: Payer: Self-pay | Admitting: Nurse Practitioner

## 2021-02-18 ENCOUNTER — Other Ambulatory Visit: Payer: Self-pay | Admitting: Nurse Practitioner

## 2021-02-18 DIAGNOSIS — F411 Generalized anxiety disorder: Secondary | ICD-10-CM

## 2021-02-18 DIAGNOSIS — F331 Major depressive disorder, recurrent, moderate: Secondary | ICD-10-CM

## 2021-02-18 MED ORDER — BUPROPION HCL ER (XL) 150 MG PO TB24: 150.0000 mg | ORAL_TABLET | ORAL | 2 refills | Status: DC

## 2021-02-18 MED ORDER — ALPRAZOLAM 0.5 MG PO TABS: 0.5000 mg | ORAL_TABLET | Freq: Every evening | ORAL | 0 refills | Status: DC | PRN

## 2021-02-25 ENCOUNTER — Other Ambulatory Visit: Payer: Self-pay

## 2021-02-25 MED ORDER — VALACYCLOVIR HCL 500 MG PO TABS: ORAL_TABLET | ORAL | 0 refills | Status: DC

## 2021-03-06 ENCOUNTER — Other Ambulatory Visit: Payer: Self-pay

## 2021-03-06 DIAGNOSIS — Z9889 Other specified postprocedural states: Secondary | ICD-10-CM | POA: Diagnosis not present

## 2021-03-06 DIAGNOSIS — I10 Essential (primary) hypertension: Secondary | ICD-10-CM

## 2021-03-06 MED ORDER — AMLODIPINE BESYLATE 5 MG PO TABS: ORAL_TABLET | ORAL | 11 refills | Status: DC

## 2021-03-13 NOTE — Progress Notes (Deleted)
Assessment and Plan:  There are no diagnoses linked to this encounter.    Further disposition pending results of labs. Discussed med's effects and SE's.   Over 30 minutes of exam, counseling, chart review, and critical decision making was performed.   Future Appointments  Date Time Provider [place]  03/17/2021  2:30 PM Magda Bernheim, NP [facility] None  05/26/2021  2:00 PM Magda Bernheim, NP [facility] None    ------------------------------------------------------------------------------------------------------------------   HPI There were no vitals taken for this visit. 56 y.o.female presents for  Past Medical History:  Diagnosis Date   Allergy    Anemia    Anxiety    ASCUS (atypical squamous cells of undetermined significance) on Pap smear    neg HR HPV 09/2008, ascus 06/2009,10/2009   Fatty liver 08/2018   ultrasound   Fatty liver    HSV-2 infection    Hypercholesteremia    Hypertension    OSA on CPAP    Pes planus of both feet 06/20/2013   Prediabetes    Sleep apnea      Allergies  Allergen Reactions   Doxycycline Hives and Shortness Of Breath   Ace Inhibitors     cough   Adhesive [Tape]    Augmentin [Amoxicillin-Pot Clavulanate] Nausea Only    diarrhea   Clindamycin/Lincomycin    Epinephrine-Lidocaine-Na Metabisulfite [Lidocaine-Epinephrine]     Has heart palpitations from the epi, wants without   Levaquin [Levofloxacin] Other (See Comments)    Dysphoria   Vicodin [Hydrocodone-Acetaminophen] Itching    Current Outpatient Medications on File Prior to Visit  Medication Sig   ALPRAZolam (XANAX) 0.5 MG tablet Take 1 tablet (0.5 mg total) by mouth at bedtime as needed.   amLODipine (NORVASC) 5 MG tablet TAKE 1 TABLET(5 MG) BY MOUTH DAILY   buPROPion (WELLBUTRIN XL) 150 MG 24 hr tablet Take 1 tablet (150 mg total) by mouth every morning.   escitalopram (LEXAPRO) 20 MG tablet TAKE ONE TABLET BY MOUTH DAILY FOR MOOD   etonogestrel (NEXPLANON) 68 MG IMPL  implant 1 each by Subdermal route once.   fluticasone (FLONASE) 50 MCG/ACT nasal spray Place into both nostrils daily.   ibuprofen (ADVIL) 200 MG tablet Take 200 mg by mouth every 6 (six) hours as needed.   MAGNESIUM PO Take by mouth as needed. Reported on 06/27/2015   phentermine 37.5 MG capsule Take 37.5 mg by mouth every morning. prn (Patient not taking: Reported on 02/13/2021)   promethazine (PHENERGAN) 25 MG tablet Take 1 tablet (25 mg total) by mouth every 6 (six) hours as needed for nausea or vomiting. New Script - Sig: Take 1 to 2 tablets at Bedtime as needed for Nausea or Sleep   rosuvastatin (CRESTOR) 10 MG tablet Take  1 tablet   Daily for Cholesterol / Patient knows to take by mouth   tirzepatide (MOUNJARO) 5 MG/0.5ML Pen Inject 5 mg into the skin once a week.   traZODone (DESYREL) 50 MG tablet Take 50 mg by mouth at bedtime. prn   Turmeric (QC TUMERIC COMPLEX PO) Take by mouth. (Patient not taking: Reported on 02/13/2021)   valACYclovir (VALTREX) 500 MG tablet TAKE ONE TO TWO TABLETS BY MOUTH DAILY FOR FEVER BLISTER PROPHYLAXIS   Vitamin D, Ergocalciferol, (DRISDOL) 1.25 MG (50000 UNIT) CAPS capsule Take 1 capsule 1 x  /week for severe Vitamin D  Deficiency   No current facility-administered medications on file prior to visit.    ROS: all negative except above.   Physical Exam:  There were no vitals taken for this visit.  General Appearance: Well nourished, in no apparent distress. Eyes: PERRLA, EOMs, conjunctiva no swelling or erythema Sinuses: No Frontal/maxillary tenderness ENT/Mouth: Ext aud canals clear, TMs without erythema, bulging. No erythema, swelling, or exudate on post pharynx.  Tonsils not swollen or erythematous. Hearing normal.  Neck: Supple, thyroid normal.  Respiratory: Respiratory effort normal, BS equal bilaterally without rales, rhonchi, wheezing or stridor.  Cardio: RRR with no MRGs. Brisk peripheral pulses without edema.  Abdomen: Soft, + BS.  Non tender,  no guarding, rebound, hernias, masses. Lymphatics: Non tender without lymphadenopathy.  Musculoskeletal: Full ROM, 5/5 strength, normal gait.  Skin: Warm, dry without rashes, lesions, ecchymosis.  Neuro: Cranial nerves intact. Normal muscle tone, no cerebellar symptoms. Sensation intact.  Psych: Awake and oriented X 3, normal affect, Insight and Judgment appropriate.     Magda Bernheim, NP 3:22 PM [facility]

## 2021-03-17 ENCOUNTER — Ambulatory Visit: Payer: 59 | Admitting: Nurse Practitioner

## 2021-03-20 NOTE — Progress Notes (Deleted)
Assessment and Plan:  There are no diagnoses linked to this encounter.    Further disposition pending results of labs. Discussed med's effects and SE's.   Over 30 minutes of exam, counseling, chart review, and critical decision making was performed.   Future Appointments  Date Time Provider [place]  03/24/2021  4:00 PM Magda Bernheim, NP [facility] None  05/26/2021  2:00 PM Magda Bernheim, NP [facility] None    ------------------------------------------------------------------------------------------------------------------   HPI There were no vitals taken for this visit. 56 y.o.female presents for  Past Medical History:  Diagnosis Date   Allergy    Anemia    Anxiety    ASCUS (atypical squamous cells of undetermined significance) on Pap smear    neg HR HPV 09/2008, ascus 06/2009,10/2009   Fatty liver 08/2018   ultrasound   Fatty liver    HSV-2 infection    Hypercholesteremia    Hypertension    OSA on CPAP    Pes planus of both feet 06/20/2013   Prediabetes    Sleep apnea      Allergies  Allergen Reactions   Doxycycline Hives and Shortness Of Breath   Ace Inhibitors     cough   Adhesive [Tape]    Augmentin [Amoxicillin-Pot Clavulanate] Nausea Only    diarrhea   Clindamycin/Lincomycin    Epinephrine-Lidocaine-Na Metabisulfite [Lidocaine-Epinephrine]     Has heart palpitations from the epi, wants without   Levaquin [Levofloxacin] Other (See Comments)    Dysphoria   Vicodin [Hydrocodone-Acetaminophen] Itching    Current Outpatient Medications on File Prior to Visit  Medication Sig   ALPRAZolam (XANAX) 0.5 MG tablet Take 1 tablet (0.5 mg total) by mouth at bedtime as needed.   amLODipine (NORVASC) 5 MG tablet TAKE 1 TABLET(5 MG) BY MOUTH DAILY   buPROPion (WELLBUTRIN XL) 150 MG 24 hr tablet Take 1 tablet (150 mg total) by mouth every morning.   escitalopram (LEXAPRO) 20 MG tablet TAKE ONE TABLET BY MOUTH DAILY FOR MOOD   etonogestrel (NEXPLANON) 68 MG IMPL  implant 1 each by Subdermal route once.   fluticasone (FLONASE) 50 MCG/ACT nasal spray Place into both nostrils daily.   ibuprofen (ADVIL) 200 MG tablet Take 200 mg by mouth every 6 (six) hours as needed.   MAGNESIUM PO Take by mouth as needed. Reported on 06/27/2015   phentermine 37.5 MG capsule Take 37.5 mg by mouth every morning. prn (Patient not taking: Reported on 02/13/2021)   promethazine (PHENERGAN) 25 MG tablet Take 1 tablet (25 mg total) by mouth every 6 (six) hours as needed for nausea or vomiting. New Script - Sig: Take 1 to 2 tablets at Bedtime as needed for Nausea or Sleep   rosuvastatin (CRESTOR) 10 MG tablet Take  1 tablet   Daily for Cholesterol / Patient knows to take by mouth   tirzepatide (MOUNJARO) 5 MG/0.5ML Pen Inject 5 mg into the skin once a week.   traZODone (DESYREL) 50 MG tablet Take 50 mg by mouth at bedtime. prn   Turmeric (QC TUMERIC COMPLEX PO) Take by mouth. (Patient not taking: Reported on 02/13/2021)   valACYclovir (VALTREX) 500 MG tablet TAKE ONE TO TWO TABLETS BY MOUTH DAILY FOR FEVER BLISTER PROPHYLAXIS   Vitamin D, Ergocalciferol, (DRISDOL) 1.25 MG (50000 UNIT) CAPS capsule Take 1 capsule 1 x  /week for severe Vitamin D  Deficiency   No current facility-administered medications on file prior to visit.    ROS: all negative except above.   Physical Exam:  There were no vitals taken for this visit.  General Appearance: Well nourished, in no apparent distress. Eyes: PERRLA, EOMs, conjunctiva no swelling or erythema Sinuses: No Frontal/maxillary tenderness ENT/Mouth: Ext aud canals clear, TMs without erythema, bulging. No erythema, swelling, or exudate on post pharynx.  Tonsils not swollen or erythematous. Hearing normal.  Neck: Supple, thyroid normal.  Respiratory: Respiratory effort normal, BS equal bilaterally without rales, rhonchi, wheezing or stridor.  Cardio: RRR with no MRGs. Brisk peripheral pulses without edema.  Abdomen: Soft, + BS.  Non tender,  no guarding, rebound, hernias, masses. Lymphatics: Non tender without lymphadenopathy.  Musculoskeletal: Full ROM, 5/5 strength, normal gait.  Skin: Warm, dry without rashes, lesions, ecchymosis.  Neuro: Cranial nerves intact. Normal muscle tone, no cerebellar symptoms. Sensation intact.  Psych: Awake and oriented X 3, normal affect, Insight and Judgment appropriate.     Magda Bernheim, NP 11:32 AM [facility]

## 2021-03-24 ENCOUNTER — Ambulatory Visit: Payer: 59 | Admitting: Nurse Practitioner

## 2021-03-24 NOTE — Progress Notes (Signed)
Assessment and Plan:  Wendy Levine was seen today for follow-up.  Diagnoses and all orders for this visit:  Morbid obesity with BMI of 45.0-49.9, adult ([place]) Currently on Mounjaro weekly and Phentermine PRN Continue diet and exercise  Essential hypertension - continue medications, DASH diet, exercise and monitor at home. Call if greater than 130/80.    Moderate episode of recurrent major depressive disorder (HCC)/Anxiety state Continue Wellbutrin daily, lexapro daily  and xanax PRN Continue diet and exercise Continue to practice good sleep hygiene Monitor symptoms        Further disposition pending results of labs. Discussed med's effects and SE's.   Over 20 minutes of exam, counseling, chart review, and critical decision making was performed.   Future Appointments  Date Time Provider [place]  05/26/2021  2:00 PM Magda Bernheim, NP [facility] None    ------------------------------------------------------------------------------------------------------------------   HPI BP 130/78    Pulse 75    Temp (!) 97.5 F (36.4 C)    Wt 228 lb 9.6 oz (103.7 kg)    SpO2 95%    BMI 38.04 kg/m  56 y.o.female presents for evaluation of anxiety and depression.  She is doing much better on Wellbutrin and Lexapro. She continues to work on finding a companion for her mother so she can get some caregiver relief.   BMI is Body mass index is 38.04 kg/m., she has been working on diet and exercise. She is down 20 pounds since last visit. She is currently on Mounjaro 5 mg weekly. Will not be able to continue on this medication. She is using the [place] app to help her activity.  She is still doing Phentermine intermittently as well. Wt Readings from Last 3 Encounters:  03/26/21 228 lb 9.6 oz (103.7 kg)  02/13/21 235 lb 9.6 oz (106.9 kg)  12/17/20 248 lb 9.6 oz (112.8 kg)   Blood pressure is well controlled on Amlodipine 5 mg daily BP Readings from Last 3 Encounters:  03/26/21 130/78  02/13/21  (!) 144/84  12/17/20 (!) 150/80     Past Medical History:  Diagnosis Date   Allergy    Anemia    Anxiety    ASCUS (atypical squamous cells of undetermined significance) on Pap smear    neg HR HPV 09/2008, ascus 06/2009,10/2009   Fatty liver 08/2018   ultrasound   Fatty liver    HSV-2 infection    Hypercholesteremia    Hypertension    OSA on CPAP    Pes planus of both feet 06/20/2013   Prediabetes    Sleep apnea      Allergies  Allergen Reactions   Doxycycline Hives and Shortness Of Breath   Ace Inhibitors     cough   Adhesive [Tape]    Augmentin [Amoxicillin-Pot Clavulanate] Nausea Only    diarrhea   Clindamycin/Lincomycin    Epinephrine-Lidocaine-Na Metabisulfite [Lidocaine-Epinephrine]     Has heart palpitations from the epi, wants without   Levaquin [Levofloxacin] Other (See Comments)    Dysphoria   Vicodin [Hydrocodone-Acetaminophen] Itching    Current Outpatient Medications on File Prior to Visit  Medication Sig   ALPRAZolam (XANAX) 0.5 MG tablet Take 1 tablet (0.5 mg total) by mouth at bedtime as needed.   amLODipine (NORVASC) 5 MG tablet TAKE 1 TABLET(5 MG) BY MOUTH DAILY   buPROPion (WELLBUTRIN XL) 150 MG 24 hr tablet Take 1 tablet (150 mg total) by mouth every morning.   escitalopram (LEXAPRO) 20 MG tablet TAKE ONE TABLET BY MOUTH DAILY FOR MOOD  etonogestrel (NEXPLANON) 68 MG IMPL implant 1 each by Subdermal route once.   fluticasone (FLONASE) 50 MCG/ACT nasal spray Place into both nostrils daily.   ibuprofen (ADVIL) 200 MG tablet Take 200 mg by mouth every 6 (six) hours as needed.   MAGNESIUM PO Take by mouth as needed. Reported on 06/27/2015   phentermine 37.5 MG capsule Take 37.5 mg by mouth every morning. prn (Patient not taking: Reported on 02/13/2021)   promethazine (PHENERGAN) 25 MG tablet Take 1 tablet (25 mg total) by mouth every 6 (six) hours as needed for nausea or vomiting. New Script - Sig: Take 1 to 2 tablets at Bedtime as needed for Nausea or  Sleep   rosuvastatin (CRESTOR) 10 MG tablet Take  1 tablet   Daily for Cholesterol / Patient knows to take by mouth   tirzepatide (MOUNJARO) 5 MG/0.5ML Pen Inject 5 mg into the skin once a week.   traZODone (DESYREL) 50 MG tablet Take 50 mg by mouth at bedtime. prn   Turmeric (QC TUMERIC COMPLEX PO) Take by mouth. (Patient not taking: Reported on 02/13/2021)   valACYclovir (VALTREX) 500 MG tablet TAKE ONE TO TWO TABLETS BY MOUTH DAILY FOR FEVER BLISTER PROPHYLAXIS   Vitamin D, Ergocalciferol, (DRISDOL) 1.25 MG (50000 UNIT) CAPS capsule Take 1 capsule 1 x  /week for severe Vitamin D  Deficiency   No current facility-administered medications on file prior to visit.    ROS: all negative except above.   Physical Exam:  BP 130/78    Pulse 75    Temp (!) 97.5 F (36.4 C)    Wt 228 lb 9.6 oz (103.7 kg)    SpO2 95%    BMI 38.04 kg/m   General Appearance: Well nourished, in no apparent distress. Eyes: PERRLA, EOMs, conjunctiva no swelling or erythema Sinuses: No Frontal/maxillary tenderness ENT/Mouth: Ext aud canals clear, TMs without erythema, bulging. No erythema, swelling, or exudate on post pharynx.  Tonsils not swollen or erythematous. Hearing normal.  Neck: Supple, thyroid normal. Soft tissue swelling of left neck persists, nontender Respiratory: Respiratory effort normal, BS equal bilaterally without rales, rhonchi, wheezing or stridor.  Cardio: RRR with no MRGs. Brisk peripheral pulses without edema.  Abdomen: Soft, + BS.  Non tender, no guarding, rebound, hernias, masses. Lymphatics: Non tender without lymphadenopathy.  Musculoskeletal: Full ROM, 5/5 strength, normal gait.  Skin: Warm, dry without rashes, lesions, ecchymosis.  Neuro: Cranial nerves intact. Normal muscle tone, no cerebellar symptoms. Sensation intact.  Psych: Awake and oriented X 3, normal affect, Insight and Judgment appropriate.     Magda Bernheim, NP 11:40 AM [facility]

## 2021-03-26 ENCOUNTER — Ambulatory Visit: Payer: 59 | Admitting: Nurse Practitioner

## 2021-03-26 ENCOUNTER — Encounter: Payer: Self-pay | Admitting: Nurse Practitioner

## 2021-03-26 ENCOUNTER — Other Ambulatory Visit: Payer: Self-pay

## 2021-03-26 DIAGNOSIS — Z6841 Body Mass Index (BMI) 40.0 and over, adult: Secondary | ICD-10-CM

## 2021-03-26 DIAGNOSIS — F411 Generalized anxiety disorder: Secondary | ICD-10-CM | POA: Diagnosis not present

## 2021-03-26 DIAGNOSIS — I10 Essential (primary) hypertension: Secondary | ICD-10-CM

## 2021-03-26 DIAGNOSIS — R69 Illness, unspecified: Secondary | ICD-10-CM | POA: Diagnosis not present

## 2021-03-26 DIAGNOSIS — F331 Major depressive disorder, recurrent, moderate: Secondary | ICD-10-CM | POA: Diagnosis not present

## 2021-03-27 ENCOUNTER — Other Ambulatory Visit: Payer: Self-pay | Admitting: Nurse Practitioner

## 2021-03-27 DIAGNOSIS — E119 Type 2 diabetes mellitus without complications: Secondary | ICD-10-CM

## 2021-04-01 ENCOUNTER — Encounter: Payer: Self-pay | Admitting: Nurse Practitioner

## 2021-04-08 ENCOUNTER — Other Ambulatory Visit: Payer: Self-pay | Admitting: Nurse Practitioner

## 2021-04-08 ENCOUNTER — Encounter: Payer: Self-pay | Admitting: Nurse Practitioner

## 2021-04-08 DIAGNOSIS — L570 Actinic keratosis: Secondary | ICD-10-CM | POA: Diagnosis not present

## 2021-04-08 DIAGNOSIS — R7309 Other abnormal glucose: Secondary | ICD-10-CM

## 2021-04-08 MED ORDER — TRULICITY 1.5 MG/0.5ML ~~LOC~~ SOAJ: 1.5000 mg | SUBCUTANEOUS | 3 refills | Status: DC

## 2021-04-09 ENCOUNTER — Other Ambulatory Visit: Payer: Self-pay | Admitting: Adult Health Nurse Practitioner

## 2021-04-10 ENCOUNTER — Telehealth: Payer: Self-pay

## 2021-04-10 NOTE — Telephone Encounter (Signed)
Prior Auth for Trulicity denied

## 2021-05-02 ENCOUNTER — Other Ambulatory Visit: Payer: Self-pay | Admitting: Adult Health Nurse Practitioner

## 2021-05-02 ENCOUNTER — Other Ambulatory Visit: Payer: Self-pay | Admitting: Nurse Practitioner

## 2021-05-02 DIAGNOSIS — F411 Generalized anxiety disorder: Secondary | ICD-10-CM

## 2021-05-16 ENCOUNTER — Other Ambulatory Visit: Payer: Self-pay | Admitting: Nurse Practitioner

## 2021-05-16 ENCOUNTER — Telehealth: Payer: Self-pay | Admitting: Nurse Practitioner

## 2021-05-16 DIAGNOSIS — F331 Major depressive disorder, recurrent, moderate: Secondary | ICD-10-CM

## 2021-05-16 NOTE — Telephone Encounter (Signed)
Patient states that she has heard that Darcel Bayley is now available for patient's that do not have diabetes. Would like another RX sent into [facility] at [place] ?

## 2021-05-16 NOTE — Telephone Encounter (Signed)
It is only approved through insurance for patients with a diabetes . There has been no change, we just saw the rep this week

## 2021-05-22 NOTE — Progress Notes (Signed)
COMPLETE PHYSICAL ? ? ?Assessment and Plan:  ? ?Encounter for general adult medical examination with abnormal findings ?Yearly ? ?Essential hypertension ?Continue current medications: Norvasc 5 mg ?Monitor blood pressure at home; call if consistently over 130/80 ?Continue DASH diet.   ?Reminder to go to the ER if any CP, SOB, nausea, dizziness, severe HA, changes vision/speech, left arm numbness and tingling and jaw pain. ?-     CBC with Differential/Platelet ?-     CMP ?-     TSH ?-     EKG 12-Lead ? ?OSA (obstructive sleep apnea) ?Continue CPAP ? ?Hypercholesteremia ?Continue medications: Rosuvastatin '10mg'$  ?Discussed dietary and exercise modifications ?Low fat diet ?-     Lipid panel ? ?Class 2 obesity due to excess calories without serious comorbidity with body mass index (BMI) of 37.0 to 37.9 in adult ?- long discussion about weight loss, diet, and exercise ?Continue Phentermine and Ozempic ? ?Medication management ?-     Magnesium ? ?Vitamin D deficiency ?-     VITAMIN D 25 Hydroxy (Vit-D Deficiency, Fractures) ? ? ?Anemia, unspecified type ?- monitor, continue iron supp with Vitamin C and increase green leafy veggies ? ?HSV-2 infection ?Continue meds PRN ? ?Insomnia ?Improved ?Using alprazolam 0.'5mg'$  PRN ? ?Depression / Anxiety ?Doing well ?Continue medications: escitalopram '20mg'$  daily and Wellbutrin 150 mg  ?Discussed stress management techniques  ?Discussed, increase water,intake & good sleep hygiene  ?Discussed increasing exercise & vegetables in diet ? ? ?Chronic midline low back pain without sciatica ?Consider tumerick for this ?Follows with Dr Mina Marble ? ? ?Abnormal glucose ?Discussed dietary and exercise modifications ?Ozempic .'5mg'$  SQ QW ?-     Hemoglobin A1c; ? ?Vitamin D deficiency ?Continue Vit D supplementation to maintain value in therapeutic level of 60-100  ?-     VITAMIN D 25 Hydroxy (Vit-D Deficiency, Fractures); Future ? ? ?Screening for blood or protein in urine ?-     Urinalysis with reflex  microscopic ?- Microalbumin/creatinine urine ratio ? ?Screening, ischemic heart disease ?-     EKG 12-Lead ? ?Screening for thyroid disorder ?      -  TSH ? ? ?Patient to return in one day for labs related to time constraints, left her mother home alone. ? ?Continue diet and meds as discussed. Further disposition pending results of labs. ?Over 40 minutes of face to face interview, exam, counseling, chart review, and critical decision making was performed ? ?Future Appointments  ?Date Time Provider [place]  ?05/27/2022  2:00 PM Yoskar Murrillo, Townsend Roger, NP [facility] None  ? ? ? ?HPI ?56 y.o. female  presents for CPE and 3 month follow up on HTN, HLD, prediabetes, obesity, and vitamin D deficiency.  ?  ?BMI is Body mass index is 37.08 kg/m?., she has been working on diet and exercise. ?Wt Readings from Last 3 Encounters:  ?05/26/21 222 lb 12.8 oz (101.1 kg)  ?03/26/21 228 lb 9.6 oz (103.7 kg)  ?02/13/21 235 lb 9.6 oz (106.9 kg)  ? ? ? ?Her blood pressure has been controlled at home , today their BP is BP: 122/80  ?BP Readings from Last 3 Encounters:  ?05/26/21 122/80  ?03/26/21 130/78  ?02/13/21 (!) 144/84  ? ? ?She lives with mother and moved in with her from [place].  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 She has a helper who is starting 06/07/21 but patient states she wants to move out.  ? ? ?She did not  tolerate amitriptyline, had red eye and pressure- has stopped. She is taking escitlopram, her anxiety has improved.  She has alprazolam prn and uses this typically at bedtime to help with sleep, infrequent use Alprazolam 0.5 mg #30 filled on 05/03/21. ? ?DG lumbar, 09/21/19 showed L5-S1 disc space narrowing, mild L4-5 facet degenerative change with small bowel loop thickened folds.  ?Recent reached out to Dr Mina Marble regarding increase in lower back pain and received an injection within the past month. ? ?She has used linzess in the past for  constipation.  Now diet and lifestyle controlled. ?She works to drink 64-80 ounces of water a day. ? ? She does not workout. She denies chest pain, dizziness, SOB.  She is on cholesterol medication, crestor '10mg'$  daily and denies myalgias. Her cholesterol is at goal. The cholesterol last visit was:   ?Lab Results  ?Component Value Date  ? CHOL 177 05/27/2020  ? HDL 58 05/27/2020  ? [place] 97 05/27/2020  ? TRIG 123 05/27/2020  ? CHOLHDL 3.1 05/27/2020  ? ? She has been working on diet and exercise for prediabetes with insulin resistance due to obesity, and denies paresthesia of the feet, polydipsia, polyuria and visual disturbances. Last A1C in the office was:  ?Lab Results  ?Component Value Date  ? HGBA1C 5.4 05/27/2020  ? ?Patient is on Vitamin D supplement.   ?Lab Results  ?Component Value Date  ? VD25OH 73 05/27/2020  ?   ?She does have a history of ASCUS 2011, follows with Dr. Erin Fulling.  She had Nexplanon removed 03/06/2021 ? ? ? ?Current Medications:  ? ?Current Outpatient Medications (Endocrine & Metabolic):  ?  Dulaglutide (TRULICITY) 1.5 OE/7.0JJ SOPN, Inject 1.5 mg into the skin once a week. ? ?Current Outpatient Medications (Cardiovascular):  ?  amLODipine (NORVASC) 5 MG tablet, TAKE 1 TABLET(5 MG) BY MOUTH DAILY ?  rosuvastatin (CRESTOR) 10 MG tablet, Take  1 tablet   Daily for Cholesterol / Patient knows to take by mouth ? ?Current Outpatient Medications (Respiratory):  ?  fluticasone (FLONASE) 50 MCG/ACT nasal spray, Place into both nostrils daily. ?  promethazine (PHENERGAN) 25 MG tablet, Take 1 tablet (25 mg total) by mouth every 6 (six) hours as needed for nausea or vomiting. New Script - Sig: Take 1 to 2 tablets at Bedtime as needed for Nausea or Sleep ? ? ? ?Current Outpatient Medications (Other):  ?  ALPRAZolam (XANAX) 0.5 MG tablet, TAKE ONE TABLET BY MOUTH EVERY NIGHT AT BEDTIME AS NEEDED ?  buPROPion (WELLBUTRIN XL) 150 MG 24 hr tablet, TAKE ONE TABLET BY MOUTH EVERY MORNING ?   escitalopram (LEXAPRO) 20 MG tablet, TAKE ONE TABLET BY MOUTH DAILY FOR MOOD ?  Multiple Vitamin (MULTIVITAMIN ADULT PO), Take by mouth. Life Extension One-Per-Day ?  phentermine (ADIPEX-P) 37.5 MG tablet, TAKE 1 TABLET BY MOUTH DAILY BEFORE BREAKFAST ?  traZODone (DESYREL) 50 MG tablet, Take 50 mg by mouth at bedtime. prn ?  valACYclovir (VALTREX) 500 MG tablet, TAKE ONE TO TWO TABLETS BY MOUTH DAILY FOR FEVER BLISTER PROPHYLAXIS ?  MAGNESIUM PO, Take by mouth as needed. Reported on 06/27/2015 (Patient not taking: Reported on 05/26/2021) ?  phentermine 37.5 MG capsule, Take 37.5 mg by mouth every morning. prn (Patient not taking: Reported on 02/13/2021) ?  Turmeric (QC TUMERIC COMPLEX PO), Take by mouth. (Patient not taking: Reported on 05/26/2021) ?  Vitamin D, Ergocalciferol, (DRISDOL) 1.25 MG (50000 UNIT) CAPS capsule, Take 1 capsule 1 x  /week for severe Vitamin D  Deficiency (Patient not taking: Reported on 05/26/2021) ? ?Medical History:  ?Past Medical History:  ?Diagnosis Date  ? Allergy   ? Anemia   ? Anxiety   ? ASCUS (atypical squamous cells of undetermined significance) on Pap smear   ? neg HR HPV 09/2008, ascus 06/2009,10/2009  ? Fatty liver 08/2018  ? ultrasound  ? Fatty liver   ? HSV-2 infection   ? Hypercholesteremia   ? Hypertension   ? OSA on CPAP   ? Pes planus of both feet 06/20/2013  ? Prediabetes   ? Sleep apnea   ? ?Health Maintenance ?Immunization History  ?Administered Date(s) Administered  ? Influenza Inj Mdck Quad With Preservative 12/01/2017  ? Influenza Split 02/02/2011  ? Influenza,inj,Quad PF,6+ Mos 11/21/2012, 11/10/2013, 11/14/2014, 11/25/2020  ? PFIZER(Purple Top)SARS-COV-2 Vaccination 04/30/2019, 05/30/2019  ? Td 06/23/2005  ? Tdap 07/09/2015  ? ? ?Tetanus: 2017 ?Pneumovax: N/A ?Prevnar 13: N/A ?Flu vaccine: 11/25/2020 ?Zostavax: N/A ? ?Pap: 11/2020 normal, Dr. Marvel Plan ?MGM: 11/2020, family history of breast cancer ?DEXA: 08/2014 ?Colonoscopy: 06/2016 Normal Dr. Havery Moros due  06/2021 ?Hpylori breath test 2016 negative ?EGD:N/A ?Echo 2007 ?PFT 06/2015 ?Last Eye Exam: Dr. Clydene Laming ?Dentist: Dr. Jake Seats ?Orthodontist- got braces Dr. Ulyess Blossom ?  ?Patient Care Team: ?Unk Pinto, MD as PCP - General (Internal

## 2021-05-26 ENCOUNTER — Ambulatory Visit: Payer: 59 | Admitting: Nurse Practitioner

## 2021-05-26 ENCOUNTER — Encounter: Payer: Self-pay | Admitting: Nurse Practitioner

## 2021-05-26 VITALS — BP 122/80 | HR 82 | Temp 97.5°F | Ht 64.75 in | Wt 222.8 lb

## 2021-05-26 DIAGNOSIS — Z79899 Other long term (current) drug therapy: Secondary | ICD-10-CM

## 2021-05-26 DIAGNOSIS — M545 Low back pain, unspecified: Secondary | ICD-10-CM

## 2021-05-26 DIAGNOSIS — E559 Vitamin D deficiency, unspecified: Secondary | ICD-10-CM | POA: Diagnosis not present

## 2021-05-26 DIAGNOSIS — Z0001 Encounter for general adult medical examination with abnormal findings: Secondary | ICD-10-CM

## 2021-05-26 DIAGNOSIS — Z0000 Encounter for general adult medical examination without abnormal findings: Secondary | ICD-10-CM | POA: Diagnosis not present

## 2021-05-26 DIAGNOSIS — F331 Major depressive disorder, recurrent, moderate: Secondary | ICD-10-CM

## 2021-05-26 DIAGNOSIS — Z136 Encounter for screening for cardiovascular disorders: Secondary | ICD-10-CM

## 2021-05-26 DIAGNOSIS — I10 Essential (primary) hypertension: Secondary | ICD-10-CM | POA: Diagnosis not present

## 2021-05-26 DIAGNOSIS — Z1329 Encounter for screening for other suspected endocrine disorder: Secondary | ICD-10-CM

## 2021-05-26 DIAGNOSIS — Z1389 Encounter for screening for other disorder: Secondary | ICD-10-CM | POA: Diagnosis not present

## 2021-05-26 DIAGNOSIS — R7309 Other abnormal glucose: Secondary | ICD-10-CM | POA: Diagnosis not present

## 2021-05-26 DIAGNOSIS — E782 Mixed hyperlipidemia: Secondary | ICD-10-CM | POA: Diagnosis not present

## 2021-05-26 DIAGNOSIS — F5101 Primary insomnia: Secondary | ICD-10-CM

## 2021-05-26 DIAGNOSIS — F411 Generalized anxiety disorder: Secondary | ICD-10-CM

## 2021-05-26 DIAGNOSIS — G4733 Obstructive sleep apnea (adult) (pediatric): Secondary | ICD-10-CM

## 2021-05-26 MED ORDER — OZEMPIC (0.25 OR 0.5 MG/DOSE) 2 MG/1.5ML ~~LOC~~ SOPN: 0.5000 mg | PEN_INJECTOR | SUBCUTANEOUS | 2 refills | Status: DC

## 2021-05-27 LAB — CBC WITH DIFFERENTIAL/PLATELET
Absolute Monocytes: 454 cells/uL (ref 200–950)
Basophils Absolute: 50 cells/uL (ref 0–200)
Basophils Relative: 0.7 %
Eosinophils Absolute: 92 cells/uL (ref 15–500)
Eosinophils Relative: 1.3 %
HCT: 41.4 % (ref 35.0–45.0)
Hemoglobin: 13.5 g/dL (ref 11.7–15.5)
Lymphs Abs: 1441 cells/uL (ref 850–3900)
MCH: 28.4 pg (ref 27.0–33.0)
MCHC: 32.6 g/dL (ref 32.0–36.0)
MCV: 87 fL (ref 80.0–100.0)
MPV: 11.7 fL (ref 7.5–12.5)
Monocytes Relative: 6.4 %
Neutro Abs: 5062 cells/uL (ref 1500–7800)
Neutrophils Relative %: 71.3 %
Platelets: 245 10*3/uL (ref 140–400)
RBC: 4.76 10*6/uL (ref 3.80–5.10)
RDW: 13 % (ref 11.0–15.0)
Total Lymphocyte: 20.3 %
WBC: 7.1 10*3/uL (ref 3.8–10.8)

## 2021-05-27 LAB — LIPID PANEL
Cholesterol: 167 mg/dL (ref <200)
HDL: 60 mg/dL (ref >=50)
LDL Cholesterol (Calc): 83 mg/dL (calc)
Non-HDL Cholesterol (Calc): 107 mg/dL (calc) (ref <130)
Total CHOL/HDL Ratio: 2.8 (calc) (ref <5.0)
Triglycerides: 137 mg/dL (ref <150)

## 2021-05-27 LAB — COMPLETE METABOLIC PANEL WITH GFR
AG Ratio: 1.7 (calc) (ref 1.0–2.5)
ALT: 21 U/L (ref 6–29)
AST: 21 U/L (ref 10–35)
Albumin: 4.7 g/dL (ref 3.6–5.1)
Alkaline phosphatase (APISO): 74 U/L (ref 37–153)
BUN: 16 mg/dL (ref 7–25)
CO2: 26 mmol/L (ref 20–32)
Calcium: 9.6 mg/dL (ref 8.6–10.4)
Chloride: 103 mmol/L (ref 98–110)
Creat: 0.95 mg/dL (ref 0.50–1.03)
Globulin: 2.7 g/dL (calc) (ref 1.9–3.7)
Glucose, Bld: 85 mg/dL (ref 65–99)
Potassium: 4.1 mmol/L (ref 3.5–5.3)
Sodium: 140 mmol/L (ref 135–146)
Total Bilirubin: 0.7 mg/dL (ref 0.2–1.2)
Total Protein: 7.4 g/dL (ref 6.1–8.1)
eGFR: 71 mL/min/{1.73_m2} (ref >=60)

## 2021-05-27 LAB — TSH: TSH: 1.37 mIU/L

## 2021-05-27 LAB — URINALYSIS, ROUTINE W REFLEX MICROSCOPIC
Bilirubin Urine: NEGATIVE
Glucose, UA: NEGATIVE
Hgb urine dipstick: NEGATIVE
Ketones, ur: NEGATIVE
Leukocytes,Ua: NEGATIVE
Nitrite: NEGATIVE
Protein, ur: NEGATIVE
Specific Gravity, Urine: 1.012 (ref 1.001–1.035)
pH: 5.5 (ref 5.0–8.0)

## 2021-05-27 LAB — MICROALBUMIN / CREATININE URINE RATIO
Creatinine, Urine: 72 mg/dL (ref 20–275)
Microalb Creat Ratio: 6 mcg/mg creat (ref <30)
Microalb, Ur: 0.4 mg/dL

## 2021-05-27 LAB — HEMOGLOBIN A1C
Hgb A1c MFr Bld: 5.3 % of total Hgb (ref <5.7)
Mean Plasma Glucose: 105 mg/dL
eAG (mmol/L): 5.8 mmol/L

## 2021-05-27 LAB — VITAMIN D 25 HYDROXY (VIT D DEFICIENCY, FRACTURES): Vit D, 25-Hydroxy: 88 ng/mL (ref 30–100)

## 2021-05-27 LAB — MAGNESIUM: Magnesium: 2.1 mg/dL (ref 1.5–2.5)

## 2021-05-28 ENCOUNTER — Telehealth: Payer: Self-pay

## 2021-05-28 NOTE — Telephone Encounter (Signed)
Prior auth for Ozempic was denied. ?

## 2021-06-12 ENCOUNTER — Other Ambulatory Visit: Payer: Self-pay | Admitting: Nurse Practitioner

## 2021-06-12 DIAGNOSIS — R7309 Other abnormal glucose: Secondary | ICD-10-CM

## 2021-06-12 MED ORDER — TRULICITY 0.75 MG/0.5ML ~~LOC~~ SOAJ: 0.7500 mg | SUBCUTANEOUS | 3 refills | Status: DC

## 2021-06-13 ENCOUNTER — Encounter: Payer: Self-pay | Admitting: Nurse Practitioner

## 2021-06-13 NOTE — Telephone Encounter (Signed)
This message is for you.

## 2021-06-16 ENCOUNTER — Telehealth: Payer: Self-pay

## 2021-06-16 NOTE — Telephone Encounter (Signed)
Prior Auth for Trulicity has been approved through 06/13/22. ?

## 2021-06-18 NOTE — Telephone Encounter (Signed)
Make sure you are pushing water and limiting salt.  Wear compression socks as much as possible.  Elevate legs above level of the heart when resting

## 2021-06-23 DIAGNOSIS — M25562 Pain in left knee: Secondary | ICD-10-CM | POA: Diagnosis not present

## 2021-06-23 DIAGNOSIS — M1712 Unilateral primary osteoarthritis, left knee: Secondary | ICD-10-CM | POA: Diagnosis not present

## 2021-06-25 ENCOUNTER — Encounter: Payer: Self-pay | Admitting: Cardiovascular Disease

## 2021-06-25 ENCOUNTER — Ambulatory Visit: Payer: 59 | Admitting: Cardiovascular Disease

## 2021-06-25 VITALS — BP 122/78 | HR 70 | Ht 65.0 in | Wt 230.2 lb

## 2021-06-25 DIAGNOSIS — K760 Fatty (change of) liver, not elsewhere classified: Secondary | ICD-10-CM | POA: Diagnosis not present

## 2021-06-25 DIAGNOSIS — R600 Localized edema: Secondary | ICD-10-CM

## 2021-06-25 DIAGNOSIS — R202 Paresthesia of skin: Secondary | ICD-10-CM

## 2021-06-25 DIAGNOSIS — E668 Other obesity: Secondary | ICD-10-CM | POA: Diagnosis not present

## 2021-06-25 DIAGNOSIS — R222 Localized swelling, mass and lump, trunk: Secondary | ICD-10-CM

## 2021-06-25 DIAGNOSIS — R609 Edema, unspecified: Secondary | ICD-10-CM

## 2021-06-25 NOTE — Patient Instructions (Signed)
Medication Instructions:  No changes *If you need a refill on your cardiac medications before your next appointment, please call your pharmacy*   Lab Work: None ordered If you have labs (blood work) drawn today and your tests are completely normal, you will receive your results only by: MyChart Message (if you have MyChart) OR A paper copy in the mail If you have any lab test that is abnormal or we need to change your treatment, we will call you to review the results.   Testing/Procedures: Your physician has requested that you have an echocardiogram. Echocardiography is a painless test that uses sound waves to create images of your heart. It provides your doctor with information about the size and shape of your heart and how well your heart's chambers and valves are working. You may receive an ultrasound enhancing agent through an IV if needed to better visualize your heart during the echo.This procedure takes approximately one hour. There are no restrictions for this procedure. This will take place at the [address], [address].    Follow-Up: At [facility], you and your health needs are our priority.  As part of our continuing mission to provide you with exceptional heart care, we have created designated Provider Care Teams.  These Care Teams include your primary Cardiologist (physician) and Advanced Practice Providers (APPs -  Physician Assistants and Nurse Practitioners) who all work together to provide you with the care you need, when you need it.  We recommend signing up for the patient portal called "MyChart".  Sign up information is provided on this After Visit Summary.  MyChart is used to connect with patients for Virtual Visits (Telemedicine).  Patients are able to view lab/test results, encounter notes, upcoming appointments, etc.  Non-urgent messages can be sent to your provider as well.   To learn more about what you can do with MyChart, go to [email or web address].     Your next appointment:   Follow up as needed with Dr. Croitoru  Important Information About Sugar       

## 2021-06-25 NOTE — Progress Notes (Signed)
?Cardiology Office Note:   ? ?Date:  06/28/2021  ? ?ID:  Wendy Levine, DOB August 16, 1965, MRN [id number] ? ?PCP:  Unk Pinto, MD ?  ?[facility] Providers ?Cardiologist:  Sanda Klein, MD    ? ?Referring MD: Unk Pinto, MD  ? ?Chief Complaint  ?Patient presents with  ? Leg Swelling  ?Wendy Levine is a 56 y.o. female who is being seen today for the evaluation of edema at the request of Unk Pinto, MD. ? ? ?History of Present Illness:   ? ?Wendy Levine is a 56 y.o. female with a hx of OSA, obesity, HTN, hypercholesterolemia, was been struggling with worsening edema of the lower extremities for may be a year or year and a half. ? ?The leg swelling has become particularly severe in the last month or so.  It is not associate with shortness of breath, orthopnea, PND.  It is always worse at the end of the day, but does not always go away completely after lying in bed overnight. ? ?In the past her hypertension was treated with hydrochlorothiazide but this was stopped since it was causing some issues with bone metabolism.  She started taking amlodipine 5 mg daily about 3 months ago (the edema was already an issue at that time).  She is also taken phentermine off and on over the last year but is currently weaning off it.  She has tried Ozempic for weight loss but this has not really worked, which are worked much better for weight loss but she has been unable to continue taking this and is now on Trulicity.  Has some symptoms of daytime hypersomnolence and is scheduled for sleep study. ? ?She has a few other complaints related to tingling and numbness in her fingertips.  The description is quite suggestive of carpal tunnel syndrome.  She has had an area of soft tissue swelling in the left supraclavicular area that has not been explained by ultrasound evaluation.  Has a history of low back pain/lumbar spine issues, currently not active. ? ?She does not have a history of congestive  heart failure, serious valve abnormalities, arrhythmia or coronary or peripheral vascular disease.  She had a normal echocardiogram in 2017.  She had duplex ultrasound performed for right leg swelling in 2015 that did not show an thrombus or evidence of venous reflux. ? ?Abdominal ultrasound in 2020 shows normal kidney sizes without evidence of hydronephrosis.  Urine studies did not show proteinuria.  The ultrasound does show evidence of hepatic steatosis, but liver function tests are all normal.  Normal PFTs in 2017. ? ?Past Medical History:  ?Diagnosis Date  ? Allergy   ? Anemia   ? Anxiety   ? ASCUS (atypical squamous cells of undetermined significance) on Pap smear   ? neg HR HPV 09/2008, ascus 06/2009,10/2009  ? Fatty liver 08/2018  ? ultrasound  ? Fatty liver   ? HSV-2 infection   ? Hypercholesteremia   ? Hypertension   ? OSA on CPAP   ? Pes planus of both feet 06/20/2013  ? Prediabetes   ? Sleep apnea   ? ? ?Past Surgical History:  ?Procedure Laterality Date  ? CESAREAN SECTION    ? COLONOSCOPY  2005?  ? in [place] -"normal"  ? COLPOSCOPY    ? KNEE SURGERY    ? Arthroscopic  ? MOUTH SURGERY    ? Nexplanon insertion  05/22/2015  ? remove moles    ? ? ?Current Medications: ?Current Meds  ?Medication Sig  ?  ALPRAZolam (XANAX) 0.5 MG tablet TAKE ONE TABLET BY MOUTH EVERY NIGHT AT BEDTIME AS NEEDED  ? buPROPion (WELLBUTRIN XL) 150 MG 24 hr tablet TAKE ONE TABLET BY MOUTH EVERY MORNING  ? Dulaglutide (TRULICITY) 8.65 HQ/4.6NG SOPN Inject 0.75 mg into the skin once a week.  ? escitalopram (LEXAPRO) 20 MG tablet TAKE ONE TABLET BY MOUTH DAILY FOR MOOD  ? Multiple Vitamin (MULTIVITAMIN ADULT PO) Take by mouth. Life Extension One-Per-Day  ? phentermine (ADIPEX-P) 37.5 MG tablet TAKE 1 TABLET BY MOUTH DAILY BEFORE BREAKFAST  ? rosuvastatin (CRESTOR) 10 MG tablet Take  1 tablet   Daily for Cholesterol / Patient knows to take by mouth  ? traZODone (DESYREL) 50 MG tablet Take 50 mg by mouth at bedtime. prn  ? tretinoin  (RETIN-A) 0.025 % cream Apply topically daily.  ? TURMERIC PO Take 538 mg by mouth daily in the afternoon.  ? [DISCONTINUED] amLODipine (NORVASC) 5 MG tablet TAKE 1 TABLET(5 MG) BY MOUTH DAILY  ?  ? ?Allergies:   Doxycycline, Ace inhibitors, Adhesive [tape], Augmentin [amoxicillin-pot clavulanate], Clindamycin/lincomycin, Epinephrine-lidocaine-na metabisulfite [lidocaine-epinephrine], Levaquin [levofloxacin], and Vicodin [hydrocodone-acetaminophen]  ? ?Social History  ? ?Socioeconomic History  ? Marital status: Divorced  ?  Spouse name: Not on file  ? Number of children: 1  ? Years of education: Not on file  ? Highest education level: Not on file  ?Occupational History  ? 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 Smoking status: Former  ?  Packs/day: 0.30  ?  Years: 10.00  ?  Pack years: 3.00  ?  Types: Cigarettes  ?  Quit date: 06/08/1999  ?  Years since quitting: 22.0  ? Smokeless tobacco: Never  ?Vaping Use  ? Vaping Use: Never used  ?Substance and Sexual Activity  ? Alcohol use: Yes  ?  Alcohol/week: 11.0 - 15.0 standard drinks  ?  Types: 4 - 8 Glasses of wine, 7 Standard drinks or equivalent per week  ? Drug use: No  ? Sexual activity: Not Currently  ?  Partners: Male  ?  Comment: Nexplanon 05/22/2015 1st intercourse 16 yo-5 partners  ?Other Topics Concern  ? Not on file  ?Social History Narrative  ? Originally from [place]. Has lived in [place] as well. Previously has traveled to most of the 28 states as well as [place]. Has prior travel to [place]. Previously has worked in Personal assistant and also with veterinarians. Has dogs and cats at home. No bird exposure. No mold exposure. Does have a hot tub/pool combination.   ? ?Social Determinants of Health  ? ?Financial Resource Strain: Not on file  ?Food Insecurity: Not on file  ?Transportation Needs: Not on file  ?Physical Activity: Not on file  ?Stress: Not on file  ?Social Connections: Not on file  ?  ? ?Family History: ?The patient's family history includes  Breast cancer (age of onset: 33) in her maternal grandmother; Cancer in her mother; Emphysema in her maternal grandmother; Hyperlipidemia in her mother; Hypertension in her maternal grandmother and mother; Other in her father. There is no history of Colon cancer. ? ?ROS:   ?Please see the history of present illness.    ? All other systems reviewed and are negative. ? ?EKGs/Labs/Other Studies Reviewed:   ? ?The following studies were reviewed today: ?Echocardiogram 2007 ?Venous duplex ultrasound of right lower extremity 2015 ?PFTs 2017 ?Abdominal ultrasound 2020 ?Ultrasound of the soft tissues of head and neck 2022 ? ?EKG:  EKG is ordered today.  The ekg ordered today demonstrates normal sinus rhythm with sinus arrhythmia, borderline QTc prolongation of 470 ms ? ?Recent Labs: ?05/26/2021: ALT 21; BUN 16; Creat 0.95; Hemoglobin 13.5; Magnesium 2.1; Platelets 245; Potassium 4.1; Sodium 140; TSH 1.37  ?Recent Lipid Panel ?   ?Component Value Date/Time  ? CHOL 167 05/26/2021 1439  ? TRIG 137 05/26/2021 1439  ? HDL 60 05/26/2021 1439  ? CHOLHDL 2.8 05/26/2021 1439  ? VLDL 27 03/23/2016 1019  ? [place] 83 05/26/2021 1439  ? ? ? ?Risk Assessment/Calculations:   ?  ? ?    ? ?Physical Exam:   ? ?VS:  BP 122/78 (BP Location: Left Arm, Patient Position: Sitting, Cuff Size: Large)   Pulse 70   Ht '5\' 5"'$  (1.651 m)   Wt 230 lb 3.2 oz (104.4 kg)   SpO2 96%   BMI 38.31 kg/m?    ? ?Wt Readings from Last 3 Encounters:  ?06/25/21 230 lb 3.2 oz (104.4 kg)  ?05/26/21 222 lb 12.8 oz (101.1 kg)  ?03/26/21 228 lb 9.6 oz (103.7 kg)  ?  ? ?GEN:  Well nourished, well developed in no acute distress ?HEENT: Normal ?NECK: No JVD; No carotid bruits ?LYMPHATICS: No lymphadenopathy ?CARDIAC: RRR, no murmurs, rubs, gallops ?RESPIRATORY:  Clear to auscultation without rales, wheezing or rhonchi  ?ABDOMEN: Soft, non-tender, non-distended ?MUSCULOSKELETAL:  No edema; No deformity  ?SKIN: Warm and dry ?NEUROLOGIC:  Alert and oriented x 3 ?PSYCHIATRIC:   Normal affect  ? ?ASSESSMENT:   ? ?1. Bilateral leg edema   ?2. Moderate obesity   ?3. Hepatic steatosis   ?4. Supraclavicular fossa fullness   ?5. Paresthesia of both hands   ? ?PLAN:   ? ?In order of prob

## 2021-06-26 ENCOUNTER — Encounter: Payer: Self-pay | Admitting: Cardiovascular Disease

## 2021-06-26 ENCOUNTER — Other Ambulatory Visit: Payer: Self-pay

## 2021-06-26 DIAGNOSIS — R590 Localized enlarged lymph nodes: Secondary | ICD-10-CM

## 2021-06-26 MED ORDER — VALSARTAN 160 MG PO TABS: 160.0000 mg | ORAL_TABLET | Freq: Every day | ORAL | 3 refills | Status: DC

## 2021-06-26 NOTE — Progress Notes (Signed)
Prescription sent to pharmacy.

## 2021-06-27 ENCOUNTER — Encounter: Payer: Self-pay | Admitting: Cardiovascular Disease

## 2021-06-27 ENCOUNTER — Other Ambulatory Visit (HOSPITAL_COMMUNITY): Payer: 59

## 2021-06-30 ENCOUNTER — Encounter: Payer: Self-pay | Admitting: Cardiovascular Disease

## 2021-06-30 MED ORDER — VALSARTAN 160 MG PO TABS: 160.0000 mg | ORAL_TABLET | Freq: Every day | ORAL | 3 refills | Status: DC

## 2021-07-14 ENCOUNTER — Other Ambulatory Visit (HOSPITAL_COMMUNITY): Payer: 59

## 2021-07-14 DIAGNOSIS — M47816 Spondylosis without myelopathy or radiculopathy, lumbar region: Secondary | ICD-10-CM | POA: Diagnosis not present

## 2021-07-22 DIAGNOSIS — M47816 Spondylosis without myelopathy or radiculopathy, lumbar region: Secondary | ICD-10-CM | POA: Diagnosis not present

## 2021-07-23 ENCOUNTER — Other Ambulatory Visit: Payer: Self-pay | Admitting: Nurse Practitioner

## 2021-07-23 ENCOUNTER — Ambulatory Visit (HOSPITAL_COMMUNITY): Payer: 59 | Attending: Cardiology

## 2021-07-23 DIAGNOSIS — R600 Localized edema: Secondary | ICD-10-CM | POA: Diagnosis not present

## 2021-07-23 DIAGNOSIS — F411 Generalized anxiety disorder: Secondary | ICD-10-CM

## 2021-07-23 LAB — ECHOCARDIOGRAM COMPLETE
Area-P 1/2: 3.77 cm2
S' Lateral: 3 cm

## 2021-07-23 MED ORDER — TRAZODONE HCL 50 MG PO TABS: 50.0000 mg | ORAL_TABLET | Freq: Every day | ORAL | 3 refills | Status: DC

## 2021-07-23 MED ORDER — ALPRAZOLAM 0.5 MG PO TABS: 0.5000 mg | ORAL_TABLET | Freq: Every evening | ORAL | 0 refills | Status: DC | PRN

## 2021-07-23 NOTE — Progress Notes (Signed)
PDMP is reviewed and appropriate  

## 2021-07-24 ENCOUNTER — Encounter: Payer: Self-pay | Admitting: Gastroenterology

## 2021-07-28 ENCOUNTER — Encounter: Payer: Self-pay | Admitting: *Deleted

## 2021-08-13 ENCOUNTER — Encounter: Payer: Self-pay | Admitting: Gastroenterology

## 2021-08-13 ENCOUNTER — Other Ambulatory Visit: Payer: Self-pay

## 2021-08-13 DIAGNOSIS — F331 Major depressive disorder, recurrent, moderate: Secondary | ICD-10-CM

## 2021-08-13 MED ORDER — BUPROPION HCL ER (XL) 150 MG PO TB24: 150.0000 mg | ORAL_TABLET | Freq: Every morning | ORAL | 2 refills | Status: DC

## 2021-08-14 ENCOUNTER — Encounter: Payer: Self-pay | Admitting: Nurse Practitioner

## 2021-08-16 ENCOUNTER — Encounter: Payer: Self-pay | Admitting: Nurse Practitioner

## 2021-08-18 ENCOUNTER — Encounter: Payer: Self-pay | Admitting: Nurse Practitioner

## 2021-08-18 ENCOUNTER — Other Ambulatory Visit: Payer: Self-pay | Admitting: Nurse Practitioner

## 2021-08-18 MED ORDER — ESCITALOPRAM OXALATE 10 MG PO TABS: 10.0000 mg | ORAL_TABLET | Freq: Every day | ORAL | 2 refills | Status: DC

## 2021-09-01 ENCOUNTER — Ambulatory Visit (HOSPITAL_COMMUNITY)
Admission: RE | Admit: 2021-09-01 | Discharge: 2021-09-01 | Disposition: A | Discharge status: Home / Self Care | Payer: 59 | Source: Ambulatory Visit | Attending: Cardiovascular Disease | Admitting: Cardiovascular Disease

## 2021-09-01 ENCOUNTER — Ambulatory Visit (AMBULATORY_SURGERY_CENTER): Payer: Self-pay | Admitting: *Deleted

## 2021-09-01 VITALS — Ht 65.0 in | Wt 230.8 lb

## 2021-09-01 DIAGNOSIS — R590 Localized enlarged lymph nodes: Secondary | ICD-10-CM | POA: Insufficient documentation

## 2021-09-01 DIAGNOSIS — Z86010 Personal history of colonic polyps: Secondary | ICD-10-CM

## 2021-09-01 DIAGNOSIS — M47812 Spondylosis without myelopathy or radiculopathy, cervical region: Secondary | ICD-10-CM | POA: Diagnosis not present

## 2021-09-01 MED ORDER — NA SULFATE-K SULFATE-MG SULF 17.5-3.13-1.6 GM/177ML PO SOLN: 1.0000 | Freq: Once | ORAL | 0 refills | Status: AC

## 2021-09-01 NOTE — Progress Notes (Signed)
No egg or soy allergy known to patient  No issues known to pt with past sedation with any surgeries or procedures Patient denies ever being told they had issues or difficulty with intubation  No FH of Malignant Hyperthermia Pt is not on diet pills Pt is not on home 02  Pt is not on blood thinners  Pt denies issues with constipation  No A fib or A flutter Have any cardiac testing pending--NO Pt instructed to use [email or web address] or GoodRx for a price reduction on prep   

## 2021-09-03 ENCOUNTER — Other Ambulatory Visit: Payer: Self-pay | Admitting: Nurse Practitioner

## 2021-09-03 DIAGNOSIS — R7309 Other abnormal glucose: Secondary | ICD-10-CM

## 2021-09-03 MED ORDER — TRULICITY 3 MG/0.5ML ~~LOC~~ SOAJ: 3.0000 mg | SUBCUTANEOUS | 3 refills | Status: DC

## 2021-09-03 NOTE — Progress Notes (Signed)
Increase in Trulicity dose to 3 mg SQ QW, no results noted at 0.75 mg dose

## 2021-09-05 ENCOUNTER — Telehealth: Payer: Self-pay | Admitting: Gastroenterology

## 2021-09-05 DIAGNOSIS — G4733 Obstructive sleep apnea (adult) (pediatric): Secondary | ICD-10-CM | POA: Diagnosis not present

## 2021-09-05 NOTE — Telephone Encounter (Signed)
Patient notified not to take Trulicity 3 days prior to procedure last dose 7/27. Pt verbalizes understanding.

## 2021-09-05 NOTE — Telephone Encounter (Signed)
Patient came in for previsit.  Since speaking with you, her doctor has put her back on Trulicity (injection once a week).  Does she need to discontinue this for her procedure?  Please call patient and advise.  Thank you.

## 2021-09-08 ENCOUNTER — Encounter: Payer: Self-pay | Admitting: Cardiovascular Disease

## 2021-09-10 ENCOUNTER — Encounter: Payer: Self-pay | Admitting: Gastroenterology

## 2021-09-10 ENCOUNTER — Other Ambulatory Visit: Payer: Self-pay

## 2021-09-10 DIAGNOSIS — E782 Mixed hyperlipidemia: Secondary | ICD-10-CM

## 2021-09-10 MED ORDER — ROSUVASTATIN CALCIUM 10 MG PO TABS: ORAL_TABLET | ORAL | 3 refills | Status: DC

## 2021-09-12 DIAGNOSIS — L821 Other seborrheic keratosis: Secondary | ICD-10-CM | POA: Diagnosis not present

## 2021-09-12 DIAGNOSIS — L304 Erythema intertrigo: Secondary | ICD-10-CM | POA: Diagnosis not present

## 2021-09-12 DIAGNOSIS — D2262 Melanocytic nevi of left upper limb, including shoulder: Secondary | ICD-10-CM | POA: Diagnosis not present

## 2021-09-12 DIAGNOSIS — D2271 Melanocytic nevi of right lower limb, including hip: Secondary | ICD-10-CM | POA: Diagnosis not present

## 2021-09-12 DIAGNOSIS — L814 Other melanin hyperpigmentation: Secondary | ICD-10-CM | POA: Diagnosis not present

## 2021-09-12 DIAGNOSIS — L738 Other specified follicular disorders: Secondary | ICD-10-CM | POA: Diagnosis not present

## 2021-09-12 DIAGNOSIS — L813 Cafe au lait spots: Secondary | ICD-10-CM | POA: Diagnosis not present

## 2021-09-12 DIAGNOSIS — D225 Melanocytic nevi of trunk: Secondary | ICD-10-CM | POA: Diagnosis not present

## 2021-09-12 DIAGNOSIS — D485 Neoplasm of uncertain behavior of skin: Secondary | ICD-10-CM | POA: Diagnosis not present

## 2021-09-17 ENCOUNTER — Encounter: Payer: Self-pay | Admitting: Gastroenterology

## 2021-09-17 ENCOUNTER — Ambulatory Visit: Payer: 59 | Admitting: Nurse Practitioner

## 2021-09-22 ENCOUNTER — Encounter: Payer: Self-pay | Admitting: Gastroenterology

## 2021-09-22 ENCOUNTER — Ambulatory Visit (AMBULATORY_SURGERY_CENTER): Payer: 59 | Admitting: Gastroenterology

## 2021-09-22 VITALS — BP 143/76 | HR 65 | Temp 98.0°F | Resp 15 | Ht 65.0 in | Wt 230.0 lb

## 2021-09-22 DIAGNOSIS — Z86010 Personal history of colonic polyps: Secondary | ICD-10-CM

## 2021-09-22 DIAGNOSIS — D125 Benign neoplasm of sigmoid colon: Secondary | ICD-10-CM | POA: Diagnosis not present

## 2021-09-22 DIAGNOSIS — Z09 Encounter for follow-up examination after completed treatment for conditions other than malignant neoplasm: Secondary | ICD-10-CM

## 2021-09-22 MED ORDER — SODIUM CHLORIDE 0.9 % IV SOLN: 500.0000 mL | INTRAVENOUS | Status: DC

## 2021-09-22 NOTE — Progress Notes (Signed)
Called to room to assist during endoscopic procedure.  Patient ID and intended procedure confirmed with present staff. Received instructions for my participation in the procedure from the performing physician.  

## 2021-09-22 NOTE — Progress Notes (Signed)
A/ox3, pleased with MAC, report to RN 

## 2021-09-22 NOTE — Patient Instructions (Signed)
Handouts on hemorrhoids and polyps given to patient. Await pathology results. Resume previous diet and continue present medications. Repeat colonoscopy for surveillance will be determined based off of pathology results.   YOU HAD AN ENDOSCOPIC PROCEDURE TODAY AT [facility]:   Refer to the procedure report that was given to you for any specific questions about what was found during the examination.  If the procedure report does not answer your questions, please call your gastroenterologist to clarify.  If you requested that your care partner not be given the details of your procedure findings, then the procedure report has been included in a sealed envelope for you to review at your convenience later.  YOU SHOULD EXPECT: Some feelings of bloating in the abdomen. Passage of more gas than usual.  Walking can help get rid of the air that was put into your GI tract during the procedure and reduce the bloating. If you had a lower endoscopy (such as a colonoscopy or flexible sigmoidoscopy) you may notice spotting of blood in your stool or on the toilet paper. If you underwent a bowel prep for your procedure, you may not have a normal bowel movement for a few days.  Please Note:  You might notice some irritation and congestion in your nose or some drainage.  This is from the oxygen used during your procedure.  There is no need for concern and it should clear up in a day or so.  SYMPTOMS TO REPORT IMMEDIATELY:  Following lower endoscopy (colonoscopy or flexible sigmoidoscopy):  Excessive amounts of blood in the stool  Significant tenderness or worsening of abdominal pains  Swelling of the abdomen that is new, acute  Fever of 100F or higher  For urgent or emergent issues, a gastroenterologist can be reached at any hour by calling [phone]. Do not use MyChart messaging for urgent concerns.    DIET:  We do recommend a small meal at first, but then you may proceed to your regular  diet.  Drink plenty of fluids but you should avoid alcoholic beverages for 24 hours.  ACTIVITY:  You should plan to take it easy for the rest of today and you should NOT DRIVE or use heavy machinery until tomorrow (because of the sedation medicines used during the test).    FOLLOW UP: Our staff will call the number listed on your records the next business day following your procedure.  We will call around 7:15- 8:00 am to check on you and address any questions or concerns that you may have regarding the information given to you following your procedure. If we do not reach you, we will leave a message.  If you develop any symptoms (ie: fever, flu-like symptoms, shortness of breath, cough etc.) before then, please call [phone].  If you test positive for Covid 19 in the 2 weeks post procedure, please call and report this information to [place].    If any biopsies were taken you will be contacted by phone or by letter within the next 1-3 weeks.  Please call us at [phone] if you have not heard about the biopsies in 3 weeks.    SIGNATURES/CONFIDENTIALITY: You and/or your care partner have signed paperwork which will be entered into your electronic medical record.  These signatures attest to the fact that that the information above on your After Visit Summary has been reviewed and is understood.  Full responsibility of the confidentiality of this discharge information lies with you and/or your care-partner.

## 2021-09-22 NOTE — Op Note (Signed)
[facility] Patient Name: Wendy Levine Procedure Date: 09/22/2021 1:33 PM MRN: [id number] Endoscopist: Remo Lipps P. Havery Moros , MD Age: 56 Referring MD:  Date of Birth: 06-20-1965 Gender: Female Account #: [id number] Procedure:                Colonoscopy Indications:              High risk colon cancer surveillance: Personal                            history of colonic polyps - 06/2016 - sessile                            serrated polyps Medicines:                Monitored Anesthesia Care Procedure:                Pre-Anesthesia Assessment:                           - Prior to the procedure, a History and Physical                            was performed, and patient medications and                            allergies were reviewed. The patient's tolerance of                            previous anesthesia was also reviewed. The risks                            and benefits of the procedure and the sedation                            options and risks were discussed with the patient.                            All questions were answered, and informed consent                            was obtained. Prior Anticoagulants: The patient has                            taken no previous anticoagulant or antiplatelet                            agents. ASA Grade Assessment: III - A patient with                            severe systemic disease. After reviewing the risks                            and benefits, the patient was deemed in  satisfactory condition to undergo the procedure.                           After obtaining informed consent, the colonoscope                            was passed under direct vision. Throughout the                            procedure, the patient's blood pressure, pulse, and                            oxygen saturations were monitored continuously. The                            PCF-HQ190L Colonoscope was introduced  through the                            anus and advanced to the the cecum, identified by                            appendiceal orifice and ileocecal valve. The                            colonoscopy was performed without difficulty. The                            patient tolerated the procedure well. The quality                            of the bowel preparation was good. The ileocecal                            valve, appendiceal orifice, and rectum were                            photographed. Scope In: 1:35:52 PM Scope Out: 1:55:14 PM Scope Withdrawal Time: 0 hours 14 minutes 55 seconds  Total Procedure Duration: 0 hours 19 minutes 22 seconds  Findings:                 The perianal and digital rectal examinations were                            normal.                           A 3 mm polyp was found in the sigmoid colon. The                            polyp was sessile. The polyp was removed with a                            cold snare. Resection and retrieval were complete.  Internal hemorrhoids were found during                            retroflexion. The hemorrhoids were small.                           The exam was otherwise without abnormality. Complications:            No immediate complications. Estimated blood loss:                            Minimal. Estimated Blood Loss:     Estimated blood loss was minimal. Impression:               - One 3 mm polyp in the sigmoid colon, removed with                            a cold snare. Resected and retrieved.                           - Internal hemorrhoids.                           - The examination was otherwise normal. Recommendation:           - Patient has a contact number available for                            emergencies. The signs and symptoms of potential                            delayed complications were discussed with the                            patient. Return to normal activities  tomorrow.                            Written discharge instructions were provided to the                            patient.                           - Resume previous diet.                           - Continue present medications.                           - Await pathology results. Remo Lipps P. Lianah Peed, MD 09/22/2021 2:01:29 PM This report has been signed electronically.

## 2021-09-22 NOTE — Progress Notes (Signed)
[facility] History and Physical   Primary Care Physician:  Wendy Pinto, MD   Reason for Procedure:   History of colon polyps  Plan:    colonoscopy     HPI: Wendy Levine is a 56 y.o. female  here for colonoscopy surveillance - last exam 06/2016, SSPs removed. Patient denies any bowel symptoms at this time. Does have some R sided flank pain she thinks is musculoskeletal. No family history of colon cancer known. Otherwise feels well without any cardiopulmonary symptoms.   I have discussed risks / benefits of anesthesia and endoscopic procedure with Wendy Levine and they wish to proceed with the exams as outlined today.    Past Medical History:  Diagnosis Date   Allergy    Anemia    Anxiety    Arthritis    KNEES,FOOT   ASCUS (atypical squamous cells of undetermined significance) on Pap smear    neg HR HPV 09/2008, ascus 06/2009,10/2009   Cataract    "MILD"   Fatty liver 08/2018   ultrasound   Fatty liver    HSV-2 infection    Hypercholesteremia    Hypertension    OSA on CPAP    Pes planus of both feet 06/20/2013   Prediabetes    Sleep apnea     Past Surgical History:  Procedure Laterality Date   CESAREAN SECTION     COLONOSCOPY  2005?   in [place] -"normal"   COLPOSCOPY     KNEE SURGERY     Arthroscopic X2   MOUTH SURGERY     WISDOM TEETH   Nexplanon insertion  05/22/2015   POLYPECTOMY     remove moles      Prior to Admission medications   Medication Sig Start Date End Date Taking? Authorizing Provider  ALPRAZolam Duanne Moron) 0.5 MG tablet Take 1 tablet (0.5 mg total) by mouth at bedtime as needed. 07/23/21  Yes Alycia Rossetti, NP  buPROPion (WELLBUTRIN XL) 150 MG 24 hr tablet Take 1 tablet (150 mg total) by mouth every morning. 08/13/21  Yes Alycia Rossetti, NP  escitalopram (LEXAPRO) 10 MG tablet Take 1 tablet (10 mg total) by mouth daily. 08/18/21 08/18/22 Yes Alycia Rossetti, NP  rosuvastatin (CRESTOR) 10 MG tablet  Take  1 tablet   Daily for Cholesterol / Patient knows to take by mouth 09/10/21  Yes Alycia Rossetti, NP  tretinoin (RETIN-A) 0.025 % cream Apply topically daily. 02/04/21  Yes [provider]  valACYclovir (VALTREX) 500 MG tablet TAKE ONE TO TWO TABLETS BY MOUTH DAILY FOR FEVER BLISTER PROPHYLAXIS Patient taking differently: as needed. TAKE ONE TO TWO TABLETS BY MOUTH DAILY FOR FEVER BLISTER PROPHYLAXIS 02/25/21  Yes Alycia Rossetti, NP  valsartan (DIOVAN) 160 MG tablet Take 1 tablet (160 mg total) by mouth daily. 06/30/21  Yes Croitoru, Mihai, MD  Dulaglutide (TRULICITY) 3 QX/4.5WT SOPN Inject 3 mg as directed once a week. 09/03/21   Alycia Rossetti, NP  fluticasone Asencion Islam) 50 MCG/ACT nasal spray Place into both nostrils daily.    [provider]  Multiple Vitamin (MULTIVITAMIN ADULT PO) Take by mouth. Life Extension One-Per-Day    [provider]  OVER THE COUNTER MEDICATION Take 2 tablets by mouth 2 (two) times daily. Patient not taking: Reported on 06/25/2021    [provider]  phentermine (ADIPEX-P) 37.5 MG tablet TAKE 1 TABLET BY MOUTH DAILY BEFORE BREAKFAST 04/09/21   Alycia Rossetti, NP  promethazine (PHENERGAN) 25 MG tablet Take 1  tablet (25 mg total) by mouth every 6 (six) hours as needed for nausea or vomiting. New Script - Sig: Take 1 to 2 tablets at Bedtime as needed for Nausea or Sleep 12/26/20   Alycia Rossetti, NP  traZODone (DESYREL) 50 MG tablet Take 1 tablet (50 mg total) by mouth at bedtime. prn 07/23/21   Alycia Rossetti, NP  TURMERIC PO Take 538 mg by mouth daily in the afternoon. Patient not taking: Reported on 09/01/2021    [provider]    Current Outpatient Medications  Medication Sig Dispense Refill   ALPRAZolam (XANAX) 0.5 MG tablet Take 1 tablet (0.5 mg total) by mouth at bedtime as needed. 30 tablet 0   buPROPion (WELLBUTRIN XL) 150 MG 24 hr tablet Take 1 tablet (150 mg total) by mouth every morning. 30 tablet 2    escitalopram (LEXAPRO) 10 MG tablet Take 1 tablet (10 mg total) by mouth daily. 30 tablet 2   rosuvastatin (CRESTOR) 10 MG tablet Take  1 tablet   Daily for Cholesterol / Patient knows to take by mouth 90 tablet 3   tretinoin (RETIN-A) 0.025 % cream Apply topically daily.     valACYclovir (VALTREX) 500 MG tablet TAKE ONE TO TWO TABLETS BY MOUTH DAILY FOR FEVER BLISTER PROPHYLAXIS (Patient taking differently: as needed. TAKE ONE TO TWO TABLETS BY MOUTH DAILY FOR FEVER BLISTER PROPHYLAXIS) 180 tablet 0   valsartan (DIOVAN) 160 MG tablet Take 1 tablet (160 mg total) by mouth daily. 90 tablet 3   Dulaglutide (TRULICITY) 3 NA/3.5TD SOPN Inject 3 mg as directed once a week. 2 mL 3   fluticasone (FLONASE) 50 MCG/ACT nasal spray Place into both nostrils daily.     Multiple Vitamin (MULTIVITAMIN ADULT PO) Take by mouth. Life Extension One-Per-Day     OVER THE COUNTER MEDICATION Take 2 tablets by mouth 2 (two) times daily. (Patient not taking: Reported on 06/25/2021)     phentermine (ADIPEX-P) 37.5 MG tablet TAKE 1 TABLET BY MOUTH DAILY BEFORE BREAKFAST 30 tablet 2   promethazine (PHENERGAN) 25 MG tablet Take 1 tablet (25 mg total) by mouth every 6 (six) hours as needed for nausea or vomiting. New Script - Sig: Take 1 to 2 tablets at Bedtime as needed for Nausea or Sleep 90 tablet 0   traZODone (DESYREL) 50 MG tablet Take 1 tablet (50 mg total) by mouth at bedtime. prn 30 tablet 3   TURMERIC PO Take 538 mg by mouth daily in the afternoon. (Patient not taking: Reported on 09/01/2021)     Current Facility-Administered Medications  Medication Dose Route Frequency Provider Last Rate Last Admin   0.9 %  sodium chloride infusion  500 mL Intravenous Continuous Antonyo Hinderer, Carlota Raspberry, MD        Allergies as of 09/22/2021 - Review Complete 09/22/2021  Allergen Reaction Noted   Doxycycline Hives and Shortness Of Breath 09/01/2010   Ace inhibitors  10/01/2014   Adhesive [tape]  09/01/2010   Augmentin [amoxicillin-pot  clavulanate] Nausea Only 09/01/2010   Clindamycin/lincomycin  01/30/2014   Epinephrine  09/01/2021   Epinephrine-lidocaine-na metabisulfite [lidocaine-epinephrine]  07/22/2016   Levaquin [levofloxacin] Other (See Comments) 09/25/2018   Vicodin [hydrocodone-acetaminophen] Itching 11/10/2013    Family History  Problem Relation Age of Onset   Colon polyps Mother    Hypertension Mother    Hyperlipidemia Mother    Cancer Mother        Skin   Colitis Mother    Other Father  this is her step-dad   Hypertension Maternal Grandmother    Breast cancer Maternal Grandmother 80   Emphysema Maternal Grandmother    Colon cancer Neg Hx    Crohn's disease Neg Hx    Esophageal cancer Neg Hx    Rectal cancer Neg Hx    Stomach cancer Neg Hx     Social History   Socioeconomic History   Marital status: Divorced    Spouse name: Not on file   Number of children: 1   Years of education: Not on file   Highest education level: Not on file  Occupational History   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  Tobacco Use   Smoking status: Former    Packs/day: 0.30    Years: 10.00    Total pack years: 3.00    Types: Cigarettes    Quit date: 06/08/1999    Years since quitting: 22.3    Passive exposure: Past   Smokeless tobacco: Never  Vaping Use   Vaping Use: Never used  Substance and Sexual Activity   Alcohol use: Yes    Alcohol/week: 11.0 - 15.0 standard drinks of alcohol    Types: 4 - 8 Glasses of wine, 7 Standard drinks or equivalent per week    Comment: 4 X A WEEK   Drug use: No   Sexual activity: Not Currently    Partners: Male    Comment: Nexplanon 05/22/2015 1st intercourse 16 yo-5 partners  Other Topics Concern   Not on file  Social History Narrative   Originally from [place]. Has lived in [place] as well. Previously has traveled to most of the 30 states as well as [place]. Has prior travel to [place]. Previously has worked in Personal assistant and also with veterinarians. Has dogs and cats  at home. No bird exposure. No mold exposure. Does have a hot tub/pool combination.    Social Determinants of Health   Financial Resource Strain: Not on file  Food Insecurity: Not on file  Transportation Needs: Not on file  Physical Activity: Not on file  Stress: Not on file  Social Connections: Not on file  Intimate Partner Violence: Not on file    Review of Systems: All other review of systems negative except as mentioned in the HPI.  Physical Exam: Vital signs BP 139/78 (BP Location: Right Arm, Patient Position: Sitting, Cuff Size: Normal)   Pulse (!) 52   Temp 98 F (36.7 C) (Temporal)   Ht '5\' 5"'$  (1.651 m)   Wt 230 lb (104.3 kg)   SpO2 99%   BMI 38.27 kg/m   General:   Alert,  Well-developed, pleasant and cooperative in NAD Lungs:  Clear throughout to auscultation.   Heart:  Regular rate and rhythm Abdomen:  Soft, nontender and nondistended.   Neuro/Psych:  Alert and cooperative. Normal mood and affect. A and O x 3  Jolly Mango, MD [facility]

## 2021-09-23 ENCOUNTER — Telehealth: Payer: Self-pay

## 2021-09-23 NOTE — Telephone Encounter (Signed)
  Follow up Call-     09/22/2021   12:53 PM 09/22/2021   12:51 PM  Call back number  Post procedure Call Back phone  # [phone]   Permission to leave phone message  Yes     Patient questions:  Do you have a fever, pain , or abdominal swelling? No. Pain Score  0 *  Have you tolerated food without any problems? Yes.    Have you been able to return to your normal activities? Yes.    Do you have any questions about your discharge instructions: Diet   No. Medications  No. Follow up visit  No.  Do you have questions or concerns about your Care? No.  Actions: * If pain score is 4 or above: No action needed, pain <4.

## 2021-09-28 ENCOUNTER — Encounter: Payer: Self-pay | Admitting: Nurse Practitioner

## 2021-09-29 ENCOUNTER — Encounter: Payer: Self-pay | Admitting: Nurse Practitioner

## 2021-09-29 ENCOUNTER — Ambulatory Visit: Payer: 59 | Admitting: Nurse Practitioner

## 2021-09-29 VITALS — BP 140/90 | HR 81 | Temp 97.7°F | Ht 65.0 in | Wt 229.8 lb

## 2021-09-29 DIAGNOSIS — R1011 Right upper quadrant pain: Secondary | ICD-10-CM | POA: Diagnosis not present

## 2021-09-29 DIAGNOSIS — I10 Essential (primary) hypertension: Secondary | ICD-10-CM | POA: Diagnosis not present

## 2021-09-29 DIAGNOSIS — R252 Cramp and spasm: Secondary | ICD-10-CM

## 2021-09-29 DIAGNOSIS — Z79899 Other long term (current) drug therapy: Secondary | ICD-10-CM

## 2021-09-29 NOTE — Patient Instructions (Signed)
YOU CAN CALL TO MAKE AN ULTRASOUND..  I have put in an order for an ultrasound for you to have You can set them up at your convenience by calling this number [phone] You will likely have the ultrasound at [address]  If you have any issues call our office and we will set this up for you.    Cholecystitis Cholecystitis is irritation and swelling (inflammation) of the gallbladder. The gallbladder: Is an organ that is shaped like a pear. Is under the liver on the right side of the body. Stores bile. Bile helps the body break down (digest) the fats in food. This condition can occur all of a sudden. It needs to be treated. What are the causes? This condition may be caused by stones or lumps that form in the gallbladder (gallstones). Gallstones can block the tube (duct) that carries bile out of your gallbladder. Other causes include: Damage to the gallbladder due to less blood flow. Germs in the bile ducts. Scars, kinks, or adhesions in the bile ducts. Abnormal growths (tumors) in the liver, pancreas, or gallbladder. What increases the risk? You are more likely to develop this condition if: You are female and between the ages of 31-62. You take birth control pills. You use estrogen. You take certain medicines that make you more likely to develop gallstones. You are overweight (obese). You have a very bad reaction to an infection (sepsis). You have been hospitalized due to a serious condition, such as a burn or illness. You have not eaten or drank for a long time. What are the signs or symptoms? Symptoms of this condition include: Pain in the upper right part of the belly (abdomen). A lump over the gallbladder. Bloating in the belly. Feeling sick to your stomach (nauseous). Vomiting. Fever. Chills. How is this treated? This condition may be treated with: Medicines to treat pain. Giving fluids through an IV tube. Not eating or drinking  (fasting). Antibiotic medicines. Surgery to take out your gallbladder. Gallbladder drainage. Follow these instructions at home: Medicines  Take over-the-counter and prescription medicines only as told by your doctor. If you were prescribed an antibiotic medicine, take it as told by your doctor. Do not stop taking it even if you start to feel better. General instructions Follow instructions from your doctor about what to eat or drink. Do not eat or drink anything that makes you sick again. Do not smoke or use any products that contain nicotine or tobacco. If you need help quitting, ask your doctor. Keep all follow-up visits. Contact a doctor if: You have pain and your medicine does not help. You have a fever. Get help right away if: Your pain moves to: Another part of your belly. Your back. Your symptoms do not go away. You have new symptoms. These symptoms may be an emergency. Get help right away. Call 911. Do not wait to see if the symptoms will go away. Do not drive yourself to the hospital. Summary This condition may be caused by stones or lumps that form in the gallbladder (gallstones). A common symptom is pain in your belly. This condition may be treated with surgery to take out your gallbladder. Follow instructions from your doctor about what to eat or drink. This information is not intended to replace advice given to you by your health care provider. Make sure you discuss any questions you have with your health care provider. Document Revised: 08/13/2020 Document Reviewed: 08/13/2020 Elsevier Patient Education  2023 Elsevier Inc.  

## 2021-09-29 NOTE — Progress Notes (Signed)
Assessment and Plan:  There are no diagnoses linked to this encounter.  Wendy Levine was seen today for acute visit.  Diagnoses and all orders for this visit:  Medication management -     COMPLETE METABOLIC PANEL WITH GFR -     Magnesium  Right upper quadrant pain Will get U/S to evaluate the gallbladder. If develop severe abdominal pain, fever, nausea/vomiting she is to go to the ER.  -     US Abdomen Limited RUQ (LIVER/GB); Future  Muscle cramps at night Stay well hydrated Begin Magnesium 400 mg daily If they persist notify the office  Hypertension - continue medications, DASH diet, exercise and monitor at home. Call if greater than 130/80.     Further disposition pending results of labs. Discussed med's effects and SE's.   Over 30 minutes of exam, counseling, chart review, and critical decision making was performed.   Future Appointments  Date Time Provider [place]  11/27/2021 11:30 AM Alycia Rossetti, NP [facility] None  05/27/2022  2:00 PM Alycia Rossetti, NP [facility] None    ------------------------------------------------------------------------------------------------------------------   HPI BP (!) 140/90   Pulse 81   Temp 97.7 F (36.5 C)   Ht '5\' 5"'$  (1.651 m)   Wt 229 lb 12.8 oz (104.2 kg)   SpO2 96%   BMI 38.24 kg/m    56 y.o.female presents for right sided upper abdominal pain, hurts worse when she eats. She has also noticed loose stools #6 on stool chart.  She describes it as an intermittent aching pain, worse as the day progresses and with eating.  Pain started on 08/27/21   Her last dose of Trulicity was 3 weeks ago, had to stop for colonoscopy. Adenomatous polyp removed by Dr. Havery Moros and is to have repeat colonoscopy in 7 years  She is having leg cramps that are severe in right thigh. She does not take magnesium regularly. The cramping has currently resolved. She had dry mouth last night but states possibly related to snoring.  Has CPAP  but is nasal pillows  BMI is Body mass index is 38.24 kg/m., she has been working on diet and exercise. Wt Readings from Last 3 Encounters:  09/29/21 229 lb 12.8 oz (104.2 kg)  09/22/21 230 lb (104.3 kg)  09/01/21 230 lb 12.8 oz (104.7 kg)   She is on Valsartan 160 mg daily. Her BP is well controlled at home . Denies headaches , chest pain, shortness of breath and dizziness.  BP Readings from Last 3 Encounters:  09/29/21 (!) 140/90  09/22/21 (!) 143/76  06/25/21 122/78     Past Medical History:  Diagnosis Date   Allergy    Anemia    Anxiety    Arthritis    KNEES,FOOT   ASCUS (atypical squamous cells of undetermined significance) on Pap smear    neg HR HPV 09/2008, ascus 06/2009,10/2009   Cataract    "MILD"   Fatty liver 08/2018   ultrasound   Fatty liver    HSV-2 infection    Hypercholesteremia    Hypertension    OSA on CPAP    Pes planus of both feet 06/20/2013   Prediabetes    Sleep apnea      Allergies  Allergen Reactions   Doxycycline Hives and Shortness Of Breath   Ace Inhibitors     cough   Adhesive [Tape]    Augmentin [Amoxicillin-Pot Clavulanate] Nausea Only    diarrhea   Clindamycin/Lincomycin    Epinephrine  Other reaction(s): Nausea, Headache   Epinephrine-Lidocaine-Na Metabisulfite [Lidocaine-Epinephrine]     Has heart palpitations from the epi, wants without   Levaquin [Levofloxacin] Other (See Comments)    Dysphoria   Vicodin [Hydrocodone-Acetaminophen] Itching    Current Outpatient Medications on File Prior to Visit  Medication Sig   ALPRAZolam (XANAX) 0.5 MG tablet Take 1 tablet (0.5 mg total) by mouth at bedtime as needed.   buPROPion (WELLBUTRIN XL) 150 MG 24 hr tablet Take 1 tablet (150 mg total) by mouth every morning.   escitalopram (LEXAPRO) 10 MG tablet Take 1 tablet (10 mg total) by mouth daily.   fluticasone (FLONASE) 50 MCG/ACT nasal spray Place into both nostrils daily.   Multiple Vitamin (MULTIVITAMIN ADULT PO) Take by mouth.  Life Extension One-Per-Day   promethazine (PHENERGAN) 25 MG tablet Take 1 tablet (25 mg total) by mouth every 6 (six) hours as needed for nausea or vomiting. New Script - Sig: Take 1 to 2 tablets at Bedtime as needed for Nausea or Sleep   rosuvastatin (CRESTOR) 10 MG tablet Take  1 tablet   Daily for Cholesterol / Patient knows to take by mouth   traZODone (DESYREL) 50 MG tablet Take 1 tablet (50 mg total) by mouth at bedtime. prn   tretinoin (RETIN-A) 0.025 % cream Apply topically daily.   TURMERIC PO Take 538 mg by mouth daily in the afternoon.   valACYclovir (VALTREX) 500 MG tablet TAKE ONE TO TWO TABLETS BY MOUTH DAILY FOR FEVER BLISTER PROPHYLAXIS (Patient taking differently: as needed. TAKE ONE TO TWO TABLETS BY MOUTH DAILY FOR FEVER BLISTER PROPHYLAXIS)   valsartan (DIOVAN) 160 MG tablet Take 1 tablet (160 mg total) by mouth daily.   Dulaglutide (TRULICITY) 3 JI/9.6VE SOPN Inject 3 mg as directed once a week. (Patient not taking: Reported on 09/29/2021)   OVER THE COUNTER MEDICATION Take 2 tablets by mouth 2 (two) times daily. (Patient not taking: Reported on 06/25/2021)   phentermine (ADIPEX-P) 37.5 MG tablet TAKE 1 TABLET BY MOUTH DAILY BEFORE BREAKFAST (Patient not taking: Reported on 09/29/2021)   No current facility-administered medications on file prior to visit.    ROS: all negative except above.   Physical Exam:  BP (!) 140/90   Pulse 81   Temp 97.7 F (36.5 C)   Ht '5\' 5"'$  (1.651 m)   Wt 229 lb 12.8 oz (104.2 kg)   SpO2 96%   BMI 38.24 kg/m   General Appearance: Well nourished, in no apparent distress. Eyes: PERRLA, EOMs, conjunctiva no swelling or erythema Sinuses: No Frontal/maxillary tenderness ENT/Mouth: Ext aud canals clear, TMs without erythema, bulging. No erythema, swelling, or exudate on post pharynx.  Tonsils not swollen or erythematous. Hearing normal.  Neck: Supple, thyroid normal.  Respiratory: Respiratory effort normal, BS equal bilaterally without rales,  rhonchi, wheezing or stridor.  Cardio: RRR with no MRGs. Brisk peripheral pulses without edema.  Abdomen: Soft, + BS. Tenderness RUQ Lymphatics: Non tender without lymphadenopathy.  Musculoskeletal: Full ROM, 5/5 strength, normal gait.  Skin: Warm, dry without rashes, lesions, ecchymosis.  Neuro: Cranial nerves intact. Normal muscle tone, no cerebellar symptoms. Sensation intact.  Psych: Awake and oriented X 3, normal affect, Insight and Judgment appropriate.     Alycia Rossetti, NP 4:22 PM [facility]

## 2021-09-30 ENCOUNTER — Encounter: Payer: Self-pay | Admitting: Nurse Practitioner

## 2021-09-30 ENCOUNTER — Ambulatory Visit
Admission: RE | Admit: 2021-09-30 | Discharge: 2021-09-30 | Disposition: A | Discharge status: Home / Self Care | Payer: 59 | Source: Ambulatory Visit | Attending: Nurse Practitioner | Admitting: Nurse Practitioner

## 2021-09-30 ENCOUNTER — Other Ambulatory Visit: Payer: Self-pay | Admitting: Nurse Practitioner

## 2021-09-30 DIAGNOSIS — R1031 Right lower quadrant pain: Secondary | ICD-10-CM

## 2021-09-30 DIAGNOSIS — M4316 Spondylolisthesis, lumbar region: Secondary | ICD-10-CM | POA: Diagnosis not present

## 2021-09-30 DIAGNOSIS — R109 Unspecified abdominal pain: Secondary | ICD-10-CM | POA: Diagnosis not present

## 2021-09-30 DIAGNOSIS — K760 Fatty (change of) liver, not elsewhere classified: Secondary | ICD-10-CM | POA: Diagnosis not present

## 2021-09-30 DIAGNOSIS — K5730 Diverticulosis of large intestine without perforation or abscess without bleeding: Secondary | ICD-10-CM | POA: Diagnosis not present

## 2021-09-30 DIAGNOSIS — R1011 Right upper quadrant pain: Secondary | ICD-10-CM

## 2021-09-30 DIAGNOSIS — K429 Umbilical hernia without obstruction or gangrene: Secondary | ICD-10-CM | POA: Diagnosis not present

## 2021-09-30 LAB — COMPLETE METABOLIC PANEL WITH GFR
AG Ratio: 1.8 (calc) (ref 1.0–2.5)
ALT: 23 U/L (ref 6–29)
AST: 19 U/L (ref 10–35)
Albumin: 4.6 g/dL (ref 3.6–5.1)
Alkaline phosphatase (APISO): 75 U/L (ref 37–153)
BUN: 15 mg/dL (ref 7–25)
CO2: 29 mmol/L (ref 20–32)
Calcium: 9.7 mg/dL (ref 8.6–10.4)
Chloride: 102 mmol/L (ref 98–110)
Creat: 0.92 mg/dL (ref 0.50–1.03)
Globulin: 2.6 g/dL (calc) (ref 1.9–3.7)
Glucose, Bld: 90 mg/dL (ref 65–99)
Potassium: 4.6 mmol/L (ref 3.5–5.3)
Sodium: 138 mmol/L (ref 135–146)
Total Bilirubin: 0.7 mg/dL (ref 0.2–1.2)
Total Protein: 7.2 g/dL (ref 6.1–8.1)
eGFR: 74 mL/min/{1.73_m2} (ref >=60)

## 2021-09-30 LAB — MAGNESIUM: Magnesium: 2 mg/dL (ref 1.5–2.5)

## 2021-09-30 MED ORDER — IOPAMIDOL (ISOVUE-300) INJECTION 61%
100.0000 mL | Freq: Once | INTRAVENOUS | Status: AC | PRN
  Administered 2021-09-30: 100 mL via INTRAVENOUS

## 2021-09-30 NOTE — Addendum Note (Signed)
Addended by: Lorenda Peck E on: 09/30/2021 11:47 AM   Modules accepted: Orders

## 2021-09-30 NOTE — Addendum Note (Signed)
Addended by: Lorenda Peck E on: 09/30/2021 11:07 AM   Modules accepted: Orders

## 2021-10-01 ENCOUNTER — Encounter: Payer: Self-pay | Admitting: Gastroenterology

## 2021-10-01 ENCOUNTER — Other Ambulatory Visit: Payer: Self-pay | Admitting: Nurse Practitioner

## 2021-10-01 ENCOUNTER — Encounter: Payer: Self-pay | Admitting: Nurse Practitioner

## 2021-10-01 DIAGNOSIS — G4733 Obstructive sleep apnea (adult) (pediatric): Secondary | ICD-10-CM

## 2021-10-06 DIAGNOSIS — G4733 Obstructive sleep apnea (adult) (pediatric): Secondary | ICD-10-CM | POA: Diagnosis not present

## 2021-10-22 ENCOUNTER — Institutional Professional Consult (permissible substitution): Payer: 59 | Admitting: Neurology

## 2021-10-23 ENCOUNTER — Ambulatory Visit: Payer: 59 | Admitting: Gastroenterology

## 2021-10-31 ENCOUNTER — Other Ambulatory Visit: Payer: Self-pay | Admitting: Nurse Practitioner

## 2021-10-31 DIAGNOSIS — F411 Generalized anxiety disorder: Secondary | ICD-10-CM

## 2021-10-31 DIAGNOSIS — R110 Nausea: Secondary | ICD-10-CM

## 2021-11-06 DIAGNOSIS — G4733 Obstructive sleep apnea (adult) (pediatric): Secondary | ICD-10-CM | POA: Diagnosis not present

## 2021-11-12 ENCOUNTER — Institutional Professional Consult (permissible substitution): Payer: 59 | Admitting: Neurology

## 2021-11-12 ENCOUNTER — Other Ambulatory Visit: Payer: Self-pay | Admitting: Nurse Practitioner

## 2021-11-12 DIAGNOSIS — F331 Major depressive disorder, recurrent, moderate: Secondary | ICD-10-CM

## 2021-11-27 ENCOUNTER — Ambulatory Visit: Payer: 59 | Admitting: Nurse Practitioner

## 2021-12-04 ENCOUNTER — Ambulatory Visit: Payer: 59 | Admitting: Nurse Practitioner

## 2021-12-09 NOTE — Progress Notes (Deleted)
Assessment and Plan:       Further disposition pending results of labs. Discussed med's effects and SE's.   Over 20 minutes of exam, counseling, chart review, and critical decision making was performed.   Future Appointments  Date Time Provider [place]  12/10/2021  4:00 PM Alycia Rossetti, NP [facility] None  12/22/2021  9:50 AM Armbruster, Carlota Raspberry, MD [facility]  05/27/2022  2:00 PM Alycia Rossetti, NP [facility] None    ------------------------------------------------------------------------------------------------------------------   HPI  There were no vitals taken for this visit.  56 y.o.female presents for evaluation of anxiety and depression.  She is doing much better on Wellbutrin and Lexapro. She continues to work on finding a companion for her mother so she can get some caregiver relief.   BMI is There is no height or weight on file to calculate BMI., she has been working on diet and exercise. She is down 20 pounds since last visit. She is currently on Mounjaro 5 mg weekly. Will not be able to continue on this medication. She is using the [place] app to help her activity.  She is still doing Phentermine intermittently as well. Wt Readings from Last 3 Encounters:  09/29/21 229 lb 12.8 oz (104.2 kg)  09/22/21 230 lb (104.3 kg)  09/01/21 230 lb 12.8 oz (104.7 kg)   Blood pressure is well controlled on Amlodipine 5 mg daily BP Readings from Last 3 Encounters:  09/29/21 (!) 140/90  09/22/21 (!) 143/76  06/25/21 122/78    She has a history of abnormal glucose Lab Results  Component Value Date   HGBA1C 5.3 05/26/2021     She is currently taking Rosuvastatin 5 mg daily for high cholesterol and denies myalgias Lab Results  Component Value Date   CHOL 167 05/26/2021   HDL 60 05/26/2021   LDLCALC 83 05/26/2021   TRIG 137 05/26/2021   CHOLHDL 2.8 05/26/2021     Past Medical History:  Diagnosis Date   Allergy    Anemia    Anxiety    Arthritis     KNEES,FOOT   ASCUS (atypical squamous cells of undetermined significance) on Pap smear    neg HR HPV 09/2008, ascus 06/2009,10/2009   Cataract    "MILD"   Fatty liver 08/2018   ultrasound   Fatty liver    HSV-2 infection    Hypercholesteremia    Hypertension    OSA on CPAP    Pes planus of both feet 06/20/2013   Prediabetes    Sleep apnea      Allergies  Allergen Reactions   Doxycycline Hives and Shortness Of Breath   Ace Inhibitors     cough   Adhesive [Tape]    Augmentin [Amoxicillin-Pot Clavulanate] Nausea Only    diarrhea   Clindamycin/Lincomycin    Epinephrine     Other reaction(s): Nausea, Headache   Epinephrine-Lidocaine-Na Metabisulfite [Lidocaine-Epinephrine]     Has heart palpitations from the epi, wants without   Levaquin [Levofloxacin] Other (See Comments)    Dysphoria   Vicodin [Hydrocodone-Acetaminophen] Itching    Current Outpatient Medications on File Prior to Visit  Medication Sig   ALPRAZolam (XANAX) 0.5 MG tablet TAKE ONE TABLET BY MOUTH AT BEDTIME AS NEEDED   buPROPion (WELLBUTRIN XL) 150 MG 24 hr tablet TAKE ONE TABLET BY MOUTH EVERY MORNING   Dulaglutide (TRULICITY) 3 XQ/1.1HE SOPN Inject 3 mg as directed once a week. (Patient not taking: Reported on 09/29/2021)   escitalopram (LEXAPRO) 10 MG tablet TAKE 1  TABLET BY MOUTH DAILY   fluticasone (FLONASE) 50 MCG/ACT nasal spray Place into both nostrils daily.   Multiple Vitamin (MULTIVITAMIN ADULT PO) Take by mouth. Life Extension One-Per-Day   OVER THE COUNTER MEDICATION Take 2 tablets by mouth 2 (two) times daily. (Patient not taking: Reported on 06/25/2021)   phentermine (ADIPEX-P) 37.5 MG tablet TAKE 1 TABLET BY MOUTH DAILY BEFORE BREAKFAST (Patient not taking: Reported on 09/29/2021)   promethazine (PHENERGAN) 25 MG tablet TAKE ONE TO TWO TABLETS BY MOUTH EVERY NIGHT AT BEDTIME AS NEEDED FOR SLEEP   rosuvastatin (CRESTOR) 10 MG tablet Take  1 tablet   Daily for Cholesterol / Patient knows to take by  mouth   traZODone (DESYREL) 50 MG tablet Take 1 tablet (50 mg total) by mouth at bedtime. prn   tretinoin (RETIN-A) 0.025 % cream Apply topically daily.   TURMERIC PO Take 538 mg by mouth daily in the afternoon.   valACYclovir (VALTREX) 500 MG tablet TAKE ONE TO TWO TABLETS BY MOUTH DAILY FOR FEVER BLISTER PROPHYLAXIS (Patient taking differently: as needed. TAKE ONE TO TWO TABLETS BY MOUTH DAILY FOR FEVER BLISTER PROPHYLAXIS)   valsartan (DIOVAN) 160 MG tablet Take 1 tablet (160 mg total) by mouth daily.   No current facility-administered medications on file prior to visit.    ROS: all negative except above.   Physical Exam:  There were no vitals taken for this visit.  General Appearance: Well nourished, in no apparent distress. Eyes: PERRLA, EOMs, conjunctiva no swelling or erythema Sinuses: No Frontal/maxillary tenderness ENT/Mouth: Ext aud canals clear, TMs without erythema, bulging. No erythema, swelling, or exudate on post pharynx.  Tonsils not swollen or erythematous. Hearing normal.  Neck: Supple, thyroid normal. Soft tissue swelling of left neck persists, nontender Respiratory: Respiratory effort normal, BS equal bilaterally without rales, rhonchi, wheezing or stridor.  Cardio: RRR with no MRGs. Brisk peripheral pulses without edema.  Abdomen: Soft, + BS.  Non tender, no guarding, rebound, hernias, masses. Lymphatics: Non tender without lymphadenopathy.  Musculoskeletal: Full ROM, 5/5 strength, normal gait.  Skin: Warm, dry without rashes, lesions, ecchymosis.  Neuro: Cranial nerves intact. Normal muscle tone, no cerebellar symptoms. Sensation intact.  Psych: Awake and oriented X 3, normal affect, Insight and Judgment appropriate.     Alycia Rossetti, NP 1:24 PM [facility]

## 2021-12-10 ENCOUNTER — Ambulatory Visit: Payer: 59 | Admitting: Nurse Practitioner

## 2021-12-10 DIAGNOSIS — Z79899 Other long term (current) drug therapy: Secondary | ICD-10-CM

## 2021-12-10 DIAGNOSIS — F411 Generalized anxiety disorder: Secondary | ICD-10-CM

## 2021-12-10 DIAGNOSIS — F331 Major depressive disorder, recurrent, moderate: Secondary | ICD-10-CM

## 2021-12-10 DIAGNOSIS — I10 Essential (primary) hypertension: Secondary | ICD-10-CM

## 2021-12-10 DIAGNOSIS — R7309 Other abnormal glucose: Secondary | ICD-10-CM

## 2021-12-10 DIAGNOSIS — E559 Vitamin D deficiency, unspecified: Secondary | ICD-10-CM

## 2021-12-10 DIAGNOSIS — E782 Mixed hyperlipidemia: Secondary | ICD-10-CM

## 2021-12-19 ENCOUNTER — Encounter: Payer: Self-pay | Admitting: Nurse Practitioner

## 2021-12-19 ENCOUNTER — Other Ambulatory Visit: Payer: Self-pay | Admitting: Nurse Practitioner

## 2021-12-19 DIAGNOSIS — B3731 Acute candidiasis of vulva and vagina: Secondary | ICD-10-CM

## 2021-12-19 MED ORDER — FLUCONAZOLE 150 MG PO TABS: 150.0000 mg | ORAL_TABLET | Freq: Once | ORAL | 0 refills | Status: AC

## 2021-12-22 ENCOUNTER — Ambulatory Visit: Payer: 59 | Admitting: Gastroenterology

## 2021-12-30 ENCOUNTER — Encounter: Payer: Self-pay | Admitting: Internal Medicine

## 2021-12-31 NOTE — Progress Notes (Deleted)
Assessment and Plan:  There are no diagnoses linked to this encounter.    Further disposition pending results of labs. Discussed med's effects and SE's.   Over 30 minutes of exam, counseling, chart review, and critical decision making was performed.   Future Appointments  Date Time Provider [place]  01/02/2022 11:30 AM Alycia Rossetti, NP [facility] None  05/27/2022  2:00 PM Alycia Rossetti, NP [facility] None    ------------------------------------------------------------------------------------------------------------------   HPI There were no vitals taken for this visit. 56 y.o.female presents for  Past Medical History:  Diagnosis Date   Allergy    Anemia    Anxiety    Arthritis    KNEES,FOOT   ASCUS (atypical squamous cells of undetermined significance) on Pap smear    neg HR HPV 09/2008, ascus 06/2009,10/2009   Cataract    "MILD"   Fatty liver 08/2018   ultrasound   Fatty liver    HSV-2 infection    Hypercholesteremia    Hypertension    OSA on CPAP    Pes planus of both feet 06/20/2013   Prediabetes    Sleep apnea      Allergies  Allergen Reactions   Doxycycline Hives and Shortness Of Breath   Ace Inhibitors     cough   Adhesive [Tape]    Augmentin [Amoxicillin-Pot Clavulanate] Nausea Only    diarrhea   Clindamycin/Lincomycin    Epinephrine     Other reaction(s): Nausea, Headache   Epinephrine-Lidocaine-Na Metabisulfite [Lidocaine-Epinephrine]     Has heart palpitations from the epi, wants without   Levaquin [Levofloxacin] Other (See Comments)    Dysphoria   Vicodin [Hydrocodone-Acetaminophen] Itching    Current Outpatient Medications on File Prior to Visit  Medication Sig   ALPRAZolam (XANAX) 0.5 MG tablet TAKE ONE TABLET BY MOUTH AT BEDTIME AS NEEDED   buPROPion (WELLBUTRIN XL) 150 MG 24 hr tablet TAKE ONE TABLET BY MOUTH EVERY MORNING   Dulaglutide (TRULICITY) 3 YQ/0.3KV SOPN Inject 3 mg as directed once a week. (Patient not taking:  Reported on 09/29/2021)   escitalopram (LEXAPRO) 10 MG tablet TAKE 1 TABLET BY MOUTH DAILY   fluticasone (FLONASE) 50 MCG/ACT nasal spray Place into both nostrils daily.   Multiple Vitamin (MULTIVITAMIN ADULT PO) Take by mouth. Life Extension One-Per-Day   OVER THE COUNTER MEDICATION Take 2 tablets by mouth 2 (two) times daily. (Patient not taking: Reported on 06/25/2021)   phentermine (ADIPEX-P) 37.5 MG tablet TAKE 1 TABLET BY MOUTH DAILY BEFORE BREAKFAST (Patient not taking: Reported on 09/29/2021)   promethazine (PHENERGAN) 25 MG tablet TAKE ONE TO TWO TABLETS BY MOUTH EVERY NIGHT AT BEDTIME AS NEEDED FOR SLEEP   rosuvastatin (CRESTOR) 10 MG tablet Take  1 tablet   Daily for Cholesterol / Patient knows to take by mouth   traZODone (DESYREL) 50 MG tablet Take 1 tablet (50 mg total) by mouth at bedtime. prn   tretinoin (RETIN-A) 0.025 % cream Apply topically daily.   TURMERIC PO Take 538 mg by mouth daily in the afternoon.   valACYclovir (VALTREX) 500 MG tablet TAKE ONE TO TWO TABLETS BY MOUTH DAILY FOR FEVER BLISTER PROPHYLAXIS (Patient taking differently: as needed. TAKE ONE TO TWO TABLETS BY MOUTH DAILY FOR FEVER BLISTER PROPHYLAXIS)   valsartan (DIOVAN) 160 MG tablet Take 1 tablet (160 mg total) by mouth daily.   No current facility-administered medications on file prior to visit.    ROS: all negative except above.   Physical Exam:  There were no vitals  taken for this visit.  General Appearance: Well nourished, in no apparent distress. Eyes: PERRLA, EOMs, conjunctiva no swelling or erythema Sinuses: No Frontal/maxillary tenderness ENT/Mouth: Ext aud canals clear, TMs without erythema, bulging. No erythema, swelling, or exudate on post pharynx.  Tonsils not swollen or erythematous. Hearing normal.  Neck: Supple, thyroid normal.  Respiratory: Respiratory effort normal, BS equal bilaterally without rales, rhonchi, wheezing or stridor.  Cardio: RRR with no MRGs. Brisk peripheral pulses  without edema.  Abdomen: Soft, + BS.  Non tender, no guarding, rebound, hernias, masses. Lymphatics: Non tender without lymphadenopathy.  Musculoskeletal: Full ROM, 5/5 strength, normal gait.  Skin: Warm, dry without rashes, lesions, ecchymosis.  Neuro: Cranial nerves intact. Normal muscle tone, no cerebellar symptoms. Sensation intact.  Psych: Awake and oriented X 3, normal affect, Insight and Judgment appropriate.     Alycia Rossetti, NP 12:55 PM [facility]

## 2022-01-02 ENCOUNTER — Ambulatory Visit: Payer: 59 | Admitting: Nurse Practitioner

## 2022-01-05 ENCOUNTER — Encounter: Payer: 59 | Admitting: Family Medicine

## 2022-01-05 ENCOUNTER — Telehealth: Payer: 59 | Admitting: Family Medicine

## 2022-01-05 DIAGNOSIS — T3695XA Adverse effect of unspecified systemic antibiotic, initial encounter: Secondary | ICD-10-CM | POA: Diagnosis not present

## 2022-01-05 DIAGNOSIS — B379 Candidiasis, unspecified: Secondary | ICD-10-CM | POA: Diagnosis not present

## 2022-01-05 DIAGNOSIS — J0110 Acute frontal sinusitis, unspecified: Secondary | ICD-10-CM | POA: Diagnosis not present

## 2022-01-05 MED ORDER — AZITHROMYCIN 250 MG PO TABS: ORAL_TABLET | ORAL | 0 refills | Status: AC

## 2022-01-05 MED ORDER — FLUCONAZOLE 150 MG PO TABS: 150.0000 mg | ORAL_TABLET | ORAL | 0 refills | Status: DC

## 2022-01-05 NOTE — Progress Notes (Signed)
Duplicate

## 2022-01-05 NOTE — Patient Instructions (Addendum)
Wendy Levine, thank you for joining Perlie Mayo, NP for today's virtual visit.  While this provider is not your primary care provider (PCP), if your PCP is located in our provider database this encounter information will be shared with them immediately following your visit.   [facility] [place] account gives you access to today's visit and all your visits, tests, and labs performed at [facility] " click here if you don't have a [facility] account or go to [email or web address]  Consent: (Patient) Wendy Levine provided verbal consent for this virtual visit at the beginning of the encounter.  Current Medications:  Current Outpatient Medications:    azithromycin (ZITHROMAX) 250 MG tablet, Take 2 tablets on day 1, then 1 tablet daily on days 2 through 5, Disp: 6 tablet, Rfl: 0   fluconazole (DIFLUCAN) 150 MG tablet, Take 1 tablet (150 mg total) by mouth as directed., Disp: 2 tablet, Rfl: 0   ALPRAZolam (XANAX) 0.5 MG tablet, TAKE ONE TABLET BY MOUTH AT BEDTIME AS NEEDED, Disp: 30 tablet, Rfl: 0   buPROPion (WELLBUTRIN XL) 150 MG 24 hr tablet, TAKE ONE TABLET BY MOUTH EVERY MORNING, Disp: 30 tablet, Rfl: 2   Dulaglutide (TRULICITY) 3 NI/6.2VO SOPN, Inject 3 mg as directed once a week. (Patient not taking: Reported on 09/29/2021), Disp: 2 mL, Rfl: 3   escitalopram (LEXAPRO) 10 MG tablet, TAKE 1 TABLET BY MOUTH DAILY, Disp: 30 tablet, Rfl: 2   fluticasone (FLONASE) 50 MCG/ACT nasal spray, Place into both nostrils daily., Disp: , Rfl:    Multiple Vitamin (MULTIVITAMIN ADULT PO), Take by mouth. Life Extension One-Per-Day, Disp: , Rfl:    OVER THE COUNTER MEDICATION, Take 2 tablets by mouth 2 (two) times daily. (Patient not taking: Reported on 06/25/2021), Disp: , Rfl:    phentermine (ADIPEX-P) 37.5 MG tablet, TAKE 1 TABLET BY MOUTH DAILY BEFORE BREAKFAST (Patient not taking: Reported on 09/29/2021), Disp: 30 tablet, Rfl: 2   promethazine (PHENERGAN) 25 MG  tablet, TAKE ONE TO TWO TABLETS BY MOUTH EVERY NIGHT AT BEDTIME AS NEEDED FOR SLEEP, Disp: 90 tablet, Rfl: 0   rosuvastatin (CRESTOR) 10 MG tablet, Take  1 tablet   Daily for Cholesterol / Patient knows to take by mouth, Disp: 90 tablet, Rfl: 3   traZODone (DESYREL) 50 MG tablet, Take 1 tablet (50 mg total) by mouth at bedtime. prn, Disp: 30 tablet, Rfl: 3   tretinoin (RETIN-A) 0.025 % cream, Apply topically daily., Disp: , Rfl:    TURMERIC PO, Take 538 mg by mouth daily in the afternoon., Disp: , Rfl:    valACYclovir (VALTREX) 500 MG tablet, TAKE ONE TO TWO TABLETS BY MOUTH DAILY FOR FEVER BLISTER PROPHYLAXIS (Patient taking differently: as needed. TAKE ONE TO TWO TABLETS BY MOUTH DAILY FOR FEVER BLISTER PROPHYLAXIS), Disp: 180 tablet, Rfl: 0   valsartan (DIOVAN) 160 MG tablet, Take 1 tablet (160 mg total) by mouth daily., Disp: 90 tablet, Rfl: 3   Medications ordered in this encounter:  Meds ordered this encounter  Medications   azithromycin (ZITHROMAX) 250 MG tablet    Sig: Take 2 tablets on day 1, then 1 tablet daily on days 2 through 5    Dispense:  6 tablet    Refill:  0    Order Specific Question:   Supervising Provider    Answer:   Chase Picket [[id number]]   fluconazole (DIFLUCAN) 150 MG tablet    Sig: Take 1 tablet (150 mg total) by mouth as  directed.    Dispense:  2 tablet    Refill:  0    Order Specific Question:   Supervising Provider    Answer:   Chase Picket [id number]     *If you need refills on other medications prior to your next appointment, please contact your pharmacy*  Follow-Up: Call back or seek an in-person evaluation if the symptoms worsen or if the condition fails to improve as anticipated.  [facility] [phone]  Other Instructions Happy Holidays  -Take meds as prescribed -Rest -Use a cool mist humidifier especially during the winter months when heat dries out the air. - Use saline nose sprays frequently to help soothe nasal  passages and promote drainage. -Saline irrigations of the nose can be very helpful if done frequently.             * 4X daily for 1 week*             * Use of a nettie pot can be helpful with this.  *Follow directions with this* *Boiled or distilled water only -stay hydrated by drinking plenty of fluids - Keep thermostat turn down low to prevent drying out sinuses - For any cough or congestion- robitussin DM or Delsym as needed - For fever or aches or pains- take tylenol or ibuprofen as directed on bottle             * for fevers greater than 101 orally you may alternate ibuprofen and tylenol every 3 hours.  If you do not improve you will need a follow up visit in person.               If you have been instructed to have an in-person evaluation today at a local Urgent Care facility, please use the link below. It will take you to a list of all of our available [facility], including address, phone number and hours of operation. Please do not delay care.  [facility]  If you or a family member do not have a primary care provider, use the link below to schedule a visit and establish care. When you choose a [facility] primary care physician or advanced practice provider, you gain a long-term partner in health. Find a Primary Care Provider  Learn more about 's in-office and virtual care options: [facility]

## 2022-01-05 NOTE — Progress Notes (Signed)
Virtual Visit Consent   Wendy Levine, you are scheduled for a virtual visit with a [facility] provider today. Just as with appointments in the office, your consent must be obtained to participate. Your consent will be active for this visit and any virtual visit you may have with one of our providers in the next 365 days. If you have a MyChart account, a copy of this consent can be sent to you electronically.  As this is a virtual visit, video technology does not allow for your provider to perform a traditional examination. This may limit your provider's ability to fully assess your condition. If your provider identifies any concerns that need to be evaluated in person or the need to arrange testing (such as labs, EKG, etc.), we will make arrangements to do so. Although advances in technology are sophisticated, we cannot ensure that it will always work on either your end or our end. If the connection with a video visit is poor, the visit may have to be switched to a telephone visit. With either a video or telephone visit, we are not always able to ensure that we have a secure connection.  By engaging in this virtual visit, you consent to the provision of healthcare and authorize for your insurance to be billed (if applicable) for the services provided during this visit. Depending on your insurance coverage, you may receive a charge related to this service.  I need to obtain your verbal consent now. Are you willing to proceed with your visit today? Wendy Levine has provided verbal consent on 01/05/2022 for a virtual visit (video or telephone). Perlie Mayo, NP  Date: 01/05/2022 12:03 PM  Virtual Visit via Video Note   I, Perlie Mayo, connected with  REGENIA ERCK  ([id number], [birth year]) on 01/05/22 at  1:00 PM EST by a video-enabled telemedicine application and verified that I am speaking with the correct person using two identifiers.  Location: Patient: Virtual  Visit Location Patient: Home Provider: Virtual Visit Location Provider: Home Office   I discussed the limitations of evaluation and management by telemedicine and the availability of in person appointments. The patient expressed understanding and agreed to proceed.    History of Present Illness: Wendy Levine is a 56 y.o. who identifies as a female who was assigned female at birth, and is being seen today for sinus infection. Nose is burning on right side from vomiting acid reflux-from getting some face wash (charcoal based) in her mouth. Left side allergy drainage. Headache, and some eye pain on right side. Boyfriend is sick as well with sinus like symptom. Is using Flonase now for the last few weeks- allergy flaring up. Denies fevers or chills, chest pain, shortness of breath, ear pain.  Additionally having a yeast infection that just started up as well- vaginal itching. Has history of yeast infections.  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   Morbid obesity with BMI of 45.0-49.9, adult ([place]) 06/19/2014   Insulin resistance 06/19/2014   OSA (obstructive sleep apnea) 06/19/2014   Vitamin D deficiency 06/19/2014   Medication management 06/19/2014   Anemia 06/19/2014   Primary localized osteoarthrosis, lower leg 10/06/2013   Hypercholesteremia    Hypertension    HSV-2 infection     Allergies:  Allergies  Allergen  Reactions   Doxycycline Hives and Shortness Of Breath   Ace Inhibitors     cough   Adhesive [Tape]    Augmentin [Amoxicillin-Pot Clavulanate] Nausea Only    diarrhea   Clindamycin/Lincomycin    Epinephrine     Other reaction(s): Nausea, Headache   Epinephrine-Lidocaine-Na Metabisulfite [Lidocaine-Epinephrine]     Has heart palpitations from the epi, wants  without   Levaquin [Levofloxacin] Other (See Comments)    Dysphoria   Vicodin [Hydrocodone-Acetaminophen] Itching   Medications:  Current Outpatient Medications:    ALPRAZolam (XANAX) 0.5 MG tablet, TAKE ONE TABLET BY MOUTH AT BEDTIME AS NEEDED, Disp: 30 tablet, Rfl: 0   buPROPion (WELLBUTRIN XL) 150 MG 24 hr tablet, TAKE ONE TABLET BY MOUTH EVERY MORNING, Disp: 30 tablet, Rfl: 2   Dulaglutide (TRULICITY) 3 IP/3.8SN SOPN, Inject 3 mg as directed once a week. (Patient not taking: Reported on 09/29/2021), Disp: 2 mL, Rfl: 3   escitalopram (LEXAPRO) 10 MG tablet, TAKE 1 TABLET BY MOUTH DAILY, Disp: 30 tablet, Rfl: 2   fluticasone (FLONASE) 50 MCG/ACT nasal spray, Place into both nostrils daily., Disp: , Rfl:    Multiple Vitamin (MULTIVITAMIN ADULT PO), Take by mouth. Life Extension One-Per-Day, Disp: , Rfl:    OVER THE COUNTER MEDICATION, Take 2 tablets by mouth 2 (two) times daily. (Patient not taking: Reported on 06/25/2021), Disp: , Rfl:    phentermine (ADIPEX-P) 37.5 MG tablet, TAKE 1 TABLET BY MOUTH DAILY BEFORE BREAKFAST (Patient not taking: Reported on 09/29/2021), Disp: 30 tablet, Rfl: 2   promethazine (PHENERGAN) 25 MG tablet, TAKE ONE TO TWO TABLETS BY MOUTH EVERY NIGHT AT BEDTIME AS NEEDED FOR SLEEP, Disp: 90 tablet, Rfl: 0   rosuvastatin (CRESTOR) 10 MG tablet, Take  1 tablet   Daily for Cholesterol / Patient knows to take by mouth, Disp: 90 tablet, Rfl: 3   traZODone (DESYREL) 50 MG tablet, Take 1 tablet (50 mg total) by mouth at bedtime. prn, Disp: 30 tablet, Rfl: 3   tretinoin (RETIN-A) 0.025 % cream, Apply topically daily., Disp: , Rfl:    TURMERIC PO, Take 538 mg by mouth daily in the afternoon., Disp: , Rfl:    valACYclovir (VALTREX) 500 MG tablet, TAKE ONE TO TWO TABLETS BY MOUTH DAILY FOR FEVER BLISTER PROPHYLAXIS (Patient taking differently: as needed. TAKE ONE TO TWO TABLETS BY MOUTH DAILY FOR FEVER BLISTER PROPHYLAXIS), Disp: 180 tablet, Rfl: 0   valsartan (DIOVAN) 160 MG tablet,  Take 1 tablet (160 mg total) by mouth daily., Disp: 90 tablet, Rfl: 3  Observations/Objective: Patient is well-developed, well-nourished in no acute distress.  Resting comfortably  at home.  Head is normocephalic, atraumatic.  No labored breathing.  Speech is clear and coherent with logical content.  Patient is alert and oriented at baseline.    Assessment and Plan:  1. Acute non-recurrent frontal sinusitis  - azithromycin (ZITHROMAX) 250 MG tablet; Take 2 tablets on day 1, then 1 tablet daily on days 2 through 5  Dispense: 6 tablet; Refill: 0  2. Antibiotic-induced yeast infection  - fluconazole (DIFLUCAN) 150 MG tablet; Take 1 tablet (150 mg total) by mouth as directed.  Dispense: 2 tablet; Refill: 0   -Take meds as prescribed -Rest -Use a cool mist humidifier especially during the winter months when heat dries out the air. - Use saline nose sprays frequently to help soothe nasal passages and promote drainage. -Saline irrigations of the nose can be very helpful if done frequently.             *  4X daily for 1 week*             * Use of a nettie pot can be helpful with this.  *Follow directions with this* *Boiled or distilled water only -stay hydrated by drinking plenty of fluids - Keep thermostat turn down low to prevent drying out sinuses - For any cough or congestion- robitussin DM or Delsym as needed - For fever or aches or pains- take tylenol or ibuprofen as directed on bottle             * for fevers greater than 101 orally you may alternate ibuprofen and tylenol every 3 hours.  If you do not improve you will need a follow up visit in person.              Yeast infection post Abx use, Diflucan provided for this.    Reviewed side effects, risks and benefits of medication.    Patient acknowledged agreement and understanding of the plan.   Past Medical, Surgical, Social History, Allergies, and Medications have been Reviewed.    Follow Up Instructions: I  discussed the assessment and treatment plan with the patient. The patient was provided an opportunity to ask questions and all were answered. The patient agreed with the plan and demonstrated an understanding of the instructions.  A copy of instructions were sent to the patient via MyChart unless otherwise noted below.     The patient was advised to call back or seek an in-person evaluation if the symptoms worsen or if the condition fails to improve as anticipated.  Time:  I spent 10 minutes with the patient via telehealth technology discussing the above problems/concerns.    Perlie Mayo, NP

## 2022-01-06 DIAGNOSIS — R69 Illness, unspecified: Secondary | ICD-10-CM | POA: Diagnosis not present

## 2022-01-06 DIAGNOSIS — Z202 Contact with and (suspected) exposure to infections with a predominantly sexual mode of transmission: Secondary | ICD-10-CM | POA: Diagnosis not present

## 2022-01-06 DIAGNOSIS — L292 Pruritus vulvae: Secondary | ICD-10-CM | POA: Diagnosis not present

## 2022-01-06 DIAGNOSIS — N760 Acute vaginitis: Secondary | ICD-10-CM | POA: Diagnosis not present

## 2022-02-04 DIAGNOSIS — M654 Radial styloid tenosynovitis [de Quervain]: Secondary | ICD-10-CM | POA: Diagnosis not present

## 2022-02-04 DIAGNOSIS — M25532 Pain in left wrist: Secondary | ICD-10-CM | POA: Diagnosis not present

## 2022-02-05 DIAGNOSIS — G4733 Obstructive sleep apnea (adult) (pediatric): Secondary | ICD-10-CM | POA: Diagnosis not present

## 2022-02-26 ENCOUNTER — Other Ambulatory Visit: Payer: Self-pay | Admitting: Nurse Practitioner

## 2022-02-26 DIAGNOSIS — R7309 Other abnormal glucose: Secondary | ICD-10-CM

## 2022-03-03 ENCOUNTER — Other Ambulatory Visit: Payer: Self-pay | Admitting: Nurse Practitioner

## 2022-03-03 DIAGNOSIS — F411 Generalized anxiety disorder: Secondary | ICD-10-CM

## 2022-03-03 MED ORDER — ALPRAZOLAM 0.5 MG PO TABS: 0.5000 mg | ORAL_TABLET | Freq: Every evening | ORAL | 0 refills | Status: DC | PRN

## 2022-03-08 DIAGNOSIS — G4733 Obstructive sleep apnea (adult) (pediatric): Secondary | ICD-10-CM | POA: Diagnosis not present

## 2022-03-30 DIAGNOSIS — Z23 Encounter for immunization: Secondary | ICD-10-CM | POA: Diagnosis not present

## 2022-03-30 DIAGNOSIS — Z1231 Encounter for screening mammogram for malignant neoplasm of breast: Secondary | ICD-10-CM | POA: Diagnosis not present

## 2022-03-30 DIAGNOSIS — Z01419 Encounter for gynecological examination (general) (routine) without abnormal findings: Secondary | ICD-10-CM | POA: Diagnosis not present

## 2022-03-30 DIAGNOSIS — R69 Illness, unspecified: Secondary | ICD-10-CM | POA: Diagnosis not present

## 2022-03-30 DIAGNOSIS — Z130 Encounter for screening for diseases of the blood and blood-forming organs and certain disorders involving the immune mechanism: Secondary | ICD-10-CM | POA: Diagnosis not present

## 2022-03-30 DIAGNOSIS — Z1389 Encounter for screening for other disorder: Secondary | ICD-10-CM | POA: Diagnosis not present

## 2022-03-30 DIAGNOSIS — N898 Other specified noninflammatory disorders of vagina: Secondary | ICD-10-CM | POA: Diagnosis not present

## 2022-03-30 DIAGNOSIS — A609 Anogenital herpesviral infection, unspecified: Secondary | ICD-10-CM | POA: Diagnosis not present

## 2022-04-08 DIAGNOSIS — G4733 Obstructive sleep apnea (adult) (pediatric): Secondary | ICD-10-CM | POA: Diagnosis not present

## 2022-04-12 ENCOUNTER — Other Ambulatory Visit: Payer: Self-pay | Admitting: Nurse Practitioner

## 2022-04-12 DIAGNOSIS — F411 Generalized anxiety disorder: Secondary | ICD-10-CM

## 2022-05-26 NOTE — Progress Notes (Deleted)
COMPLETE PHYSICAL   Assessment and Plan:   Encounter for general adult medical examination with abnormal findings Yearly  Essential hypertension Continue current medications: Norvasc 5 mg Monitor blood pressure at home; call if consistently over 130/80 Continue DASH diet.   Reminder to go to the ER if any CP, SOB, nausea, dizziness, severe HA, changes vision/speech, left arm numbness and tingling and jaw pain. -     CBC with Differential/Platelet -     CMP -     TSH -     EKG 12-Lead  OSA (obstructive sleep apnea) Continue CPAP  Hyperlipidemia, mixed Continue medications: Rosuvastatin 10mg  Discussed dietary and exercise modifications Low fat diet -     Lipid panel  Class 2 obesity due to excess calories without serious comorbidity with body mass index (BMI) of 37.0 to 37.9 in adult - long discussion about weight loss, diet, and exercise Continue Phentermine and Ozempic  Medication management -     Magnesium  Vitamin D deficiency -     VITAMIN D 25 Hydroxy (Vit-D Deficiency, Fractures)   Anemia, unspecified type - monitor, continue iron supp with Vitamin C and increase green leafy veggies  HSV-2 infection Continue meds PRN  Insomnia Improved Using alprazolam 0.5mg  PRN  Depression / Anxiety Doing well Continue medications: escitalopram 20mg  daily and Wellbutrin 150 mg  Discussed stress management techniques  Discussed, increase water,intake & good sleep hygiene  Discussed increasing exercise & vegetables in diet   Chronic midline low back pain without sciatica Consider tumerick for this Follows with Dr Mina Marble   Abnormal glucose Discussed dietary and exercise modifications Ozempic .5mg  SQ QW -     Hemoglobin A1c;  Vitamin D deficiency Continue Vit D supplementation to maintain value in therapeutic level of 60-100  -     VITAMIN D 25 Hydroxy (Vit-D Deficiency, Fractures); Future   Screening for blood or protein in urine -     Urinalysis with reflex  microscopic - Microalbumin/creatinine urine ratio  Screening, ischemic heart disease -     EKG 12-Lead  Screening for thyroid disorder       -  TSH   Patient to return in one day for labs related to time constraints, left her mother home alone.  Continue diet and meds as discussed. Further disposition pending results of labs. Over 40 minutes of face to face interview, exam, counseling, chart review, and critical decision making was performed  Future Appointments  Date Time Provider [place]  05/27/2022  2:00 PM Alycia Rossetti, NP [facility] None     HPI 57 y.o. female  presents for CPE and 3 month follow up on HTN, HLD, prediabetes, obesity, and vitamin D deficiency.    BMI is There is no height or weight on file to calculate BMI., she has been working on diet and exercise. Wt Readings from Last 3 Encounters:  09/29/21 229 lb 12.8 oz (104.2 kg)  09/22/21 230 lb (104.3 kg)  09/01/21 230 lb 12.8 oz (104.7 kg)     Her blood pressure has been controlled at home , today their BP is    BP Readings from Last 3 Encounters:  09/29/21 (!) 140/90  09/22/21 (!) 143/76  06/25/21 122/78    She lives with mother and moved in with her from [place].  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 She has a helper who is starting 06/07/21 but patient states she wants to move out.  She did not tolerate amitriptyline, had red eye and pressure- has stopped. She is taking escitlopram, her anxiety has improved.  She has alprazolam prn and uses this typically at bedtime to help with sleep, infrequent use Alprazolam 0.5 mg #30 filled on 05/03/21.  DG lumbar, 09/21/19 showed L5-S1 disc space narrowing, mild L4-5 facet degenerative change with small bowel loop thickened folds.  Recent reached out to Dr Mina Marble regarding increase in lower back pain and received an injection within the past month.  She has used linzess in the  past for constipation.  Now diet and lifestyle controlled. She works to drink 64-80 ounces of water a day.   She does not workout. She denies chest pain, dizziness, SOB.  She is on cholesterol medication, crestor 10mg  daily and denies myalgias. Her cholesterol is at goal. The cholesterol last visit was:   Lab Results  Component Value Date   CHOL 167 05/26/2021   HDL 60 05/26/2021   LDLCALC 83 05/26/2021   TRIG 137 05/26/2021   CHOLHDL 2.8 05/26/2021    She has been working on diet and exercise for prediabetes with insulin resistance due to obesity, and denies paresthesia of the feet, polydipsia, polyuria and visual disturbances. Last A1C in the office was:  Lab Results  Component Value Date   HGBA1C 5.3 05/26/2021   Patient is on Vitamin D supplement.   Lab Results  Component Value Date   VD25OH 1 05/26/2021     She does have a history of ASCUS 2011, follows with Dr. Erin Fulling.  She had Nexplanon removed 03/06/2021    Current Medications:   Current Outpatient Medications (Endocrine & Metabolic):    TRULICITY 3 0000000 SOPN, INJECT 3 MG UNDER THE SKIN ONCE WEEKLY  Current Outpatient Medications (Cardiovascular):    rosuvastatin (CRESTOR) 10 MG tablet, Take  1 tablet   Daily for Cholesterol / Patient knows to take by mouth   valsartan (DIOVAN) 160 MG tablet, Take 1 tablet (160 mg total) by mouth daily.  Current Outpatient Medications (Respiratory):    fluticasone (FLONASE) 50 MCG/ACT nasal spray, Place into both nostrils daily.   promethazine (PHENERGAN) 25 MG tablet, TAKE ONE TO TWO TABLETS BY MOUTH EVERY NIGHT AT BEDTIME AS NEEDED FOR SLEEP    Current Outpatient Medications (Other):    ALPRAZolam (XANAX) 0.5 MG tablet, TAKE ONE TABLET BY MOUTH AT BEDTIME AS NEEDED   buPROPion (WELLBUTRIN XL) 150 MG 24 hr tablet, TAKE ONE TABLET BY MOUTH EVERY MORNING   escitalopram (LEXAPRO) 10 MG tablet, TAKE 1 TABLET BY MOUTH DAILY   fluconazole (DIFLUCAN) 150 MG tablet, Take  1 tablet (150 mg total) by mouth as directed.   Multiple Vitamin (MULTIVITAMIN ADULT PO), Take by mouth. Life Extension One-Per-Day   OVER THE COUNTER MEDICATION, Take 2 tablets by mouth 2 (two) times daily. (Patient not taking: Reported on 06/25/2021)   phentermine (ADIPEX-P) 37.5 MG tablet, TAKE 1 TABLET BY MOUTH DAILY BEFORE BREAKFAST (Patient not taking: Reported on 09/29/2021)   traZODone (DESYREL) 50 MG tablet, Take 1 tablet (50 mg total) by mouth at bedtime. prn   tretinoin (RETIN-A) 0.025 % cream, Apply topically daily.   TURMERIC PO, Take 538 mg by mouth daily in the afternoon.   valACYclovir (VALTREX) 500 MG tablet, TAKE ONE TO TWO TABLETS BY MOUTH DAILY FOR FEVER BLISTER PROPHYLAXIS (Patient taking differently: as needed. TAKE ONE TO TWO TABLETS BY MOUTH DAILY FOR FEVER BLISTER PROPHYLAXIS)  Medical History:  Past Medical History:  Diagnosis Date  Allergy    Anemia    Anxiety    Arthritis    KNEES,FOOT   ASCUS (atypical squamous cells of undetermined significance) on Pap smear    neg HR HPV 09/2008, ascus 06/2009,10/2009   Cataract    "MILD"   Fatty liver 08/2018   ultrasound   Fatty liver    HSV-2 infection    Hypercholesteremia    Hypertension    OSA on CPAP    Pes planus of both feet 06/20/2013   Prediabetes    Sleep apnea    Health Maintenance Immunization History  Administered Date(s) Administered   Influenza Inj Mdck Quad With Preservative 12/01/2017   Influenza Split 02/02/2011   Influenza,inj,Quad PF,6+ Mos 11/21/2012, 11/10/2013, 11/14/2014, 11/25/2020   PFIZER(Purple Top)SARS-COV-2 Vaccination 04/30/2019, 05/30/2019   Td 06/23/2005   Tdap 07/09/2015   Zoster Recombinat (Shingrix) 08/18/2021    Tetanus: 2017 Pneumovax: N/A Prevnar 13: N/A Flu vaccine: 11/25/2020 Zostavax: N/A  Pap: 11/2020 normal, Dr. Marvel Plan MGM: 11/2020, family history of breast cancer DEXA: 08/2014 Colonoscopy: 06/2016 Normal Dr. Havery Moros due 06/2021 Hpylori breath test 2016  negative EGD:N/A Echo 2007 PFT 06/2015 Last Eye Exam: Dr. Clydene Laming Dentist: Dr. Jake Seats Orthodontist- got braces Dr. Ulyess Blossom   Patient Care Team: Unk Pinto, MD as PCP - General (Internal Medicine) Sanda Klein, MD as PCP - Cardiology (Cardiology) Berenice Primas, MD as Referring Physician (Orthopedic Surgery) Lelon Perla, MD as Consulting Physician (Cardiology) Fontaine, Belinda Block, MD (Inactive) as Consulting Physician (Gynecology) Rolm Bookbinder, MD as Consulting Physician (Dermatology) Tania Ade, MD as Consulting Physician (Orthopedic Surgery)   Allergies:  Allergies  Allergen Reactions   Doxycycline Hives and Shortness Of Breath   Ace Inhibitors     cough   Adhesive [Tape]    Augmentin [Amoxicillin-Pot Clavulanate] Nausea Only    diarrhea   Clindamycin/Lincomycin    Epinephrine     Other reaction(s): Nausea, Headache   Epinephrine-Lidocaine-Na Metabisulfite [Lidocaine-Epinephrine]     Has heart palpitations from the epi, wants without   Levaquin [Levofloxacin] Other (See Comments)    Dysphoria   Vicodin [Hydrocodone-Acetaminophen] Itching    SURGICAL HISTORY She  has a past surgical history that includes Cesarean section; remove moles; Colposcopy; Knee surgery; Mouth surgery; Nexplanon insertion (05/22/2015); Colonoscopy (2005?); and Polypectomy. FAMILY HISTORY Her family history includes Breast cancer (age of onset: 60) in her maternal grandmother; Cancer in her mother; Colitis in her mother; Colon polyps in her mother; Emphysema in her maternal grandmother; Hyperlipidemia in her mother; Hypertension in her maternal grandmother and mother; Other in her father. SOCIAL HISTORY She  reports that she quit smoking about 22 years ago. Her smoking use included cigarettes. She has a 3.00 pack-year smoking history. She has been exposed to tobacco smoke. She has never used smokeless tobacco. She reports current alcohol use of about 11.0 - 15.0 standard drinks of  alcohol per week. She reports that she does not use drugs.  Review of Systems:  Review of Systems  Constitutional:  Negative for chills and fever.  HENT:  Negative for congestion, hearing loss, sinus pain, sore throat and tinnitus.   Eyes:  Negative for blurred vision and double vision.  Respiratory:  Negative for cough, hemoptysis, sputum production, shortness of breath and wheezing.   Cardiovascular:  Negative for chest pain, palpitations and leg swelling.  Gastrointestinal:  Negative for abdominal pain, constipation, diarrhea, heartburn, nausea and vomiting.  Genitourinary:  Negative for dysuria and urgency.  Musculoskeletal:  Negative for back pain, falls, joint pain,  myalgias and neck pain.  Skin:  Negative for rash.  Neurological:  Negative for dizziness, tingling, tremors, weakness and headaches.  Endo/Heme/Allergies:  Does not bruise/bleed easily.  Psychiatric/Behavioral:  Negative for depression and suicidal ideas. The patient is not nervous/anxious and does not have insomnia.      Physical Exam: There were no vitals taken for this visit. Wt Readings from Last 3 Encounters:  09/29/21 229 lb 12.8 oz (104.2 kg)  09/22/21 230 lb (104.3 kg)  09/01/21 230 lb 12.8 oz (104.7 kg)   General Appearance: Well nourished, in no apparent distress. Eyes: PERRLA, EOMs, conjunctiva no swelling or erythema Sinuses: No Frontal/maxillary tenderness ENT/Mouth: Ext aud canals clear, TMs without erythema, bulging. Mouth and nose not examined- patient wearing a facemask. Hearing normal.  Neck: Supple, thyroid normal.  Respiratory: Respiratory effort normal, BS equal bilaterally without rales, rhonchi, wheezing or stridor.  Cardio: RRR with no MRGs. Brisk peripheral pulses without edema.  Abdomen: Soft, obese + BS,  + epigastric tenderness, no guarding, rebound, hernias, masses. Breast: Defer GYN Lymphatics: Non tender without lymphadenopathy.  Musculoskeletal: Full ROM, 5/5 strength, Normal gait,  Patient is able to ambulate well. Gait is not  Antalgic.  Genitourinary: defer Skin: Warm, dry without rashes, lesions, ecchymosis. + left hip greater trocanteric tenderness Neuro: Cranial nerves intact. Normal muscle tone, no cerebellar symptoms. Psych: Awake and oriented X 3, normal affect, Insight and Judgment appropriate.  EKG: NSR, no ST chnages  Dawne Casali Mikki Santee, NP 9:09 AM [facility]

## 2022-05-27 ENCOUNTER — Encounter: Payer: 59 | Admitting: Nurse Practitioner

## 2022-05-27 DIAGNOSIS — F5101 Primary insomnia: Secondary | ICD-10-CM

## 2022-05-27 DIAGNOSIS — Z1389 Encounter for screening for other disorder: Secondary | ICD-10-CM

## 2022-05-27 DIAGNOSIS — E782 Mixed hyperlipidemia: Secondary | ICD-10-CM

## 2022-05-27 DIAGNOSIS — F411 Generalized anxiety disorder: Secondary | ICD-10-CM

## 2022-05-27 DIAGNOSIS — F331 Major depressive disorder, recurrent, moderate: Secondary | ICD-10-CM

## 2022-05-27 DIAGNOSIS — Z0001 Encounter for general adult medical examination with abnormal findings: Secondary | ICD-10-CM

## 2022-05-27 DIAGNOSIS — Z79899 Other long term (current) drug therapy: Secondary | ICD-10-CM

## 2022-05-27 DIAGNOSIS — G4733 Obstructive sleep apnea (adult) (pediatric): Secondary | ICD-10-CM

## 2022-05-27 DIAGNOSIS — B009 Herpesviral infection, unspecified: Secondary | ICD-10-CM

## 2022-05-27 DIAGNOSIS — E559 Vitamin D deficiency, unspecified: Secondary | ICD-10-CM

## 2022-05-27 DIAGNOSIS — I10 Essential (primary) hypertension: Secondary | ICD-10-CM

## 2022-05-27 DIAGNOSIS — Z136 Encounter for screening for cardiovascular disorders: Secondary | ICD-10-CM

## 2022-05-27 DIAGNOSIS — Z1329 Encounter for screening for other suspected endocrine disorder: Secondary | ICD-10-CM

## 2022-05-27 DIAGNOSIS — G8929 Other chronic pain: Secondary | ICD-10-CM

## 2022-05-27 DIAGNOSIS — R7309 Other abnormal glucose: Secondary | ICD-10-CM

## 2022-05-27 DIAGNOSIS — D509 Iron deficiency anemia, unspecified: Secondary | ICD-10-CM

## 2022-06-16 ENCOUNTER — Telehealth: Payer: Self-pay | Admitting: Nurse Practitioner

## 2022-06-16 ENCOUNTER — Other Ambulatory Visit: Payer: Self-pay | Admitting: Nurse Practitioner

## 2022-06-16 DIAGNOSIS — F331 Major depressive disorder, recurrent, moderate: Secondary | ICD-10-CM

## 2022-06-16 MED ORDER — ESCITALOPRAM OXALATE 10 MG PO TABS
10.0000 mg | ORAL_TABLET | Freq: Every day | ORAL | 2 refills | Status: DC
Start: 2022-06-16 — End: 2022-06-23

## 2022-06-16 NOTE — Telephone Encounter (Signed)
Pt is needing a refill on escitalopram  [facility] [id number] - [place], [place] - [address]

## 2022-06-20 ENCOUNTER — Other Ambulatory Visit: Payer: Self-pay | Admitting: Nurse Practitioner

## 2022-06-20 DIAGNOSIS — R7309 Other abnormal glucose: Secondary | ICD-10-CM

## 2022-06-22 NOTE — Progress Notes (Unsigned)
COMPLETE PHYSICAL   Assessment and Plan:   Encounter for general adult medical examination with abnormal findings Yearly Mammo and PAP through GYN  Essential hypertension Continue current medications: Valsartan 160 mg Monitor blood pressure at home; call if consistently over 130/80 Continue DASH diet.   Reminder to go to the ER if any CP, SOB, nausea, dizziness, severe HA, changes vision/speech, left arm numbness and tingling and jaw pain. -     CBC with Differential/Platelet -     CMP -     TSH -     EKG 12-Lead  OSA (obstructive sleep apnea) Continue CPAP  Hyperlipidemia, mixed Continue medications: Rosuvastatin 10mg  Discussed dietary and exercise modifications Low fat diet -     Lipid panel  Class 2 obesity due to excess calories without serious comorbidity with body mass index (BMI) of 37.0 to 37.9 in adult - long discussion about weight loss, diet, and exercise Continue Phentermine and Trulicity 3 mg SQ QW  Medication management -     Magnesium  Vitamin D deficiency -     VITAMIN D 25 Hydroxy (Vit-D Deficiency, Fractures)   Anemia, unspecified type - monitor, continue iron supp with Vitamin C and increase green leafy veggies  HSV-2 infection Continue meds PRN  Insomnia Improved Using alprazolam 0.5mg  PRN  Depression / Anxiety Doing well Continue medications: escitalopram 20mg  daily  Rare use of Alprazolam 0.5 mg #30 last filled 04/13/22 Discussed stress management techniques  Discussed, increase water,intake & good sleep hygiene  Discussed increasing exercise & vegetables in diet   Chronic midline low back pain without sciatica Consider tumeric for this Follows with Dr Regino Schultze   Abnormal glucose Discussed dietary and exercise modifications Increase Trulicity to 4.5 mg SQ QW -     Hemoglobin A1c;  Vitamin D deficiency Continue Vit D supplementation to maintain value in therapeutic level of 60-100  -     VITAMIN D 25 Hydroxy (Vit-D Deficiency,  Fractures); Future   Screening for blood or protein in urine -     Urinalysis with reflex microscopic - Microalbumin/creatinine urine ratio  Screening, ischemic heart disease -     EKG 12-Lead  Screening for thyroid disorder       -  TSH  Screening for AAA - U/S ABD Retroperitoneal LTD  Muscle cramps at night - Increase fluid - Magnesium as needed - Monitor and if persists will refer to neurology  Hot Flashes Will start Veozah 45 mg once a week    Continue diet and meds as discussed. Further disposition pending results of labs. Over 40 minutes of face to face interview, exam, counseling, chart review, and critical decision making was performed  Future Appointments  Date Time Provider Department Center  06/23/2023 10:00 AM Raynelle Dick, NP [facility] None     HPI 57 y.o. female  presents for CPE and 3 month follow up on HTN, HLD, prediabetes, obesity, and vitamin D deficiency.    BMI is Body mass index is 34.98 kg/m., she has been working on diet and exercise. She is down 19 pounds in the past 8 months. Continues Phentermine and Trulicity Wt Readings from Last 3 Encounters:  06/23/22 210 lb 3.2 oz (95.3 kg)  09/29/21 229 lb 12.8 oz (104.2 kg)  09/22/21 230 lb (104.3 kg)     She does have a history of ASCUS 2011, follows with Dr. Delight Stare.  She had Nexplanon removed 03/06/2021 She is on testosterone cream through her GYN.   Her mother is now  living in an assisted living facility which has decreased anxiety slightly.   Her blood pressure has been controlled at home , today their BP is BP: 110/74 . Denies headaches, chest pain, shortness of breath and dizziness.  BP Readings from Last 3 Encounters:  06/23/22 110/74  09/29/21 (!) 140/90  09/22/21 (!) 143/76    She has been taking Lexapro 20 mg  and her anxiety has improved.  Last refill was accidentally filled for 10 mg and is not noticing as much relief of symptoms. She has alprazolam prn and uses  this typically at bedtime to help with sleep, infrequent use Alprazolam 0.5 mg #30 filled on 04/13/22  DG lumbar, 09/21/19 showed L5-S1 disc space narrowing, mild L4-5 facet degenerative change with small bowel loop thickened folds.  Recent reached out to Dr Regino Schultze regarding increase in lower back pain and received an injection within the past month.  She has used linzess in the past for constipation.  Now diet and lifestyle controlled. She works to drink 64-80 ounces of water a day.   She does not workout. She denies chest pain, dizziness, SOB.  She is on cholesterol medication, crestor 10mg  daily and denies myalgias. Her cholesterol is at goal. The cholesterol last visit was:   Lab Results  Component Value Date   CHOL 167 05/26/2021   HDL 60 05/26/2021   LDLCALC 83 05/26/2021   TRIG 137 05/26/2021   CHOLHDL 2.8 05/26/2021    She has been working on diet and exercise for prediabetes with insulin resistance due to obesity, and denies paresthesia of the feet, polydipsia, polyuria and visual disturbances. Last A1C in the office was:  Lab Results  Component Value Date   HGBA1C 5.3 05/26/2021   Patient is on Vitamin D supplement.   Lab Results  Component Value Date   VD25OH 35 05/26/2021   She continues to have increased leg cramps at night that wake her up in the middle of the night. Dehyrdation has not showed any relation.    Current Medications:   Current Outpatient Medications (Endocrine & Metabolic):    TRULICITY 3 MG/0.5ML SOPN, INJECT 3 MG UNDER THE SKIN ONCE WEEKLY  Current Outpatient Medications (Cardiovascular):    rosuvastatin (CRESTOR) 10 MG tablet, Take  1 tablet   Daily for Cholesterol / Patient knows to take by mouth   valsartan (DIOVAN) 160 MG tablet, Take 1 tablet (160 mg total) by mouth daily.  Current Outpatient Medications (Respiratory):    fluticasone (FLONASE) 50 MCG/ACT nasal spray, Place into both nostrils daily.   promethazine (PHENERGAN) 25 MG tablet, TAKE  ONE TO TWO TABLETS BY MOUTH EVERY NIGHT AT BEDTIME AS NEEDED FOR SLEEP    Current Outpatient Medications (Other):    ALPRAZolam (XANAX) 0.5 MG tablet, TAKE ONE TABLET BY MOUTH AT BEDTIME AS NEEDED   escitalopram (LEXAPRO) 10 MG tablet, Take 1 tablet (10 mg total) by mouth daily.   Multiple Vitamin (MULTIVITAMIN ADULT PO), Take by mouth. Life Extension One-Per-Day   traZODone (DESYREL) 50 MG tablet, Take 1 tablet (50 mg total) by mouth at bedtime. prn   tretinoin (RETIN-A) 0.025 % cream, Apply topically daily.   valACYclovir (VALTREX) 500 MG tablet, TAKE ONE TO TWO TABLETS BY MOUTH DAILY FOR FEVER BLISTER   buPROPion (WELLBUTRIN XL) 150 MG 24 hr tablet, TAKE ONE TABLET BY MOUTH EVERY MORNING (Patient not taking: Reported on 06/23/2022)   fluconazole (DIFLUCAN) 150 MG tablet, Take 1 tablet (150 mg total) by mouth as directed. (Patient not  taking: Reported on 06/23/2022)   OVER THE COUNTER MEDICATION, Take 2 tablets by mouth 2 (two) times daily. (Patient not taking: Reported on 06/25/2021)   phentermine (ADIPEX-P) 37.5 MG tablet, TAKE 1 TABLET BY MOUTH DAILY BEFORE BREAKFAST (Patient not taking: Reported on 09/29/2021)   TURMERIC PO, Take 538 mg by mouth daily in the afternoon. (Patient not taking: Reported on 06/23/2022)  Medical History:  Past Medical History:  Diagnosis Date   Allergy    Anemia    Anxiety    Arthritis    KNEES,FOOT   ASCUS (atypical squamous cells of undetermined significance) on Pap smear    neg HR HPV 09/2008, ascus 06/2009,10/2009   Cataract    "MILD"   Fatty liver 08/2018   ultrasound   Fatty liver    HSV-2 infection    Hypercholesteremia    Hypertension    OSA on CPAP    Pes planus of both feet 06/20/2013   Prediabetes    Sleep apnea    Health Maintenance Immunization History  Administered Date(s) Administered   Influenza Inj Mdck Quad With Preservative 12/01/2017   Influenza Split 02/02/2011   Influenza,inj,Quad PF,6+ Mos 11/21/2012, 11/10/2013, 11/14/2014,  11/25/2020   PFIZER(Purple Top)SARS-COV-2 Vaccination 04/30/2019, 05/30/2019   Td 06/23/2005   Tdap 07/09/2015   Zoster Recombinat (Shingrix) 08/18/2021    Tetanus: 2017 Pneumovax: N/A Prevnar 13: N/A Flu vaccine: 11/25/2020 Zostavax: N/A  Pap: 11/2020 normal, Dr. Senaida Ores MGM: 11/2020, family history of breast cancer DEXA: 08/2014 Colonoscopy: 06/2016 Normal Dr. Adela Lank due 06/2021 Hpylori breath test 2016 negative EGD:N/A Echo 2007 PFT 06/2015 Last Eye Exam: Dr. Lucretia Roers Dentist: Dr. Derrel Nip Orthodontist- got braces Dr. Marney Setting   Patient Care Team: Lucky Cowboy, MD as PCP - General (Internal Medicine) Thurmon Fair, MD as PCP - Cardiology (Cardiology) Sanjuana Letters, MD as Referring Physician (Orthopedic Surgery) Lewayne Bunting, MD as Consulting Physician (Cardiology) Fontaine, Nadyne Coombes, MD (Inactive) as Consulting Physician (Gynecology) Venancio Poisson, MD as Consulting Physician (Dermatology) Jones Broom, MD as Consulting Physician (Orthopedic Surgery)   Allergies:  Allergies  Allergen Reactions   Doxycycline Hives and Shortness Of Breath   Ace Inhibitors     cough   Adhesive [Tape]    Augmentin [Amoxicillin-Pot Clavulanate] Nausea Only    diarrhea   Clindamycin/Lincomycin    Epinephrine     Other reaction(s): Nausea, Headache   Epinephrine-Lidocaine-Na Metabisulfite [Lidocaine-Epinephrine]     Has heart palpitations from the epi, wants without   Levaquin [Levofloxacin] Other (See Comments)    Dysphoria   Vicodin [Hydrocodone-Acetaminophen] Itching    SURGICAL HISTORY She  has a past surgical history that includes Cesarean section; remove moles; Colposcopy; Knee surgery; Mouth surgery; Nexplanon insertion (05/22/2015); Colonoscopy (2005?); and Polypectomy. FAMILY HISTORY Her family history includes Breast cancer (age of onset: 81) in her maternal grandmother; Cancer in her mother; Colitis in her mother; Colon polyps in her mother; Emphysema in  her maternal grandmother; Hyperlipidemia in her mother; Hypertension in her maternal grandmother and mother; Other in her father. SOCIAL HISTORY She  reports that she quit smoking about 23 years ago. Her smoking use included cigarettes. She has a 3.00 pack-year smoking history. She has been exposed to tobacco smoke. She has never used smokeless tobacco. She reports current alcohol use of about 11.0 - 15.0 standard drinks of alcohol per week. She reports that she does not use drugs.  Review of Systems:  Review of Systems  Constitutional:  Negative for chills and fever.  HENT:  Negative for  congestion, hearing loss, sinus pain, sore throat and tinnitus.   Eyes:  Negative for blurred vision and double vision.  Respiratory:  Negative for cough, hemoptysis, sputum production, shortness of breath and wheezing.   Cardiovascular:  Negative for chest pain, palpitations and leg swelling.  Gastrointestinal:  Positive for constipation. Negative for abdominal pain, diarrhea, heartburn, nausea and vomiting.  Genitourinary:  Negative for dysuria and urgency.       Hot flashes  Musculoskeletal:  Negative for back pain, falls, joint pain, myalgias and neck pain.       Nighttime leg cramps  Skin:  Negative for rash.  Neurological:  Negative for dizziness, tingling, tremors, weakness and headaches.  Endo/Heme/Allergies:  Does not bruise/bleed easily.  Psychiatric/Behavioral:  Positive for depression. Negative for suicidal ideas. The patient is nervous/anxious. The patient does not have insomnia.      Physical Exam: BP 110/74   Pulse 66   Temp (!) 97.5 F (36.4 C)   Ht 5\' 5"  (1.651 m)   Wt 210 lb 3.2 oz (95.3 kg)   SpO2 97%   BMI 34.98 kg/m  Wt Readings from Last 3 Encounters:  06/23/22 210 lb 3.2 oz (95.3 kg)  09/29/21 229 lb 12.8 oz (104.2 kg)  09/22/21 230 lb (104.3 kg)   General Appearance: Pleasant obese female, in no apparent distress. Eyes: PERRLA, EOMs, conjunctiva no swelling or  erythema Sinuses: No Frontal/maxillary tenderness ENT/Mouth: Ext aud canals clear, TMs without erythema, bulging. Mouth and nose not examined- patient wearing a facemask. Hearing normal.  Neck: Supple, thyroid normal.  Respiratory: Respiratory effort normal, BS equal bilaterally without rales, rhonchi, wheezing or stridor.  Cardio: RRR with no MRGs. Brisk peripheral pulses without edema.  Abdomen: Soft, obese + BS,  + epigastric tenderness, no guarding, rebound, hernias, masses. Breast: Defer GYN Lymphatics: Non tender without lymphadenopathy.  Musculoskeletal: Full ROM, 5/5 strength, Normal gait, Patient is able to ambulate well. Gait is not  Antalgic.  Genitourinary: defer Skin: Warm, dry without rashes, lesions, ecchymosis. + left hip greater trocanteric tenderness Neuro: Cranial nerves intact. Normal muscle tone, no cerebellar symptoms. Psych: Awake and oriented X 3, normal affect, Insight and Judgment appropriate.  EKG: NSR, no ST chnages AAA: < 3 cm  Joselinne Lawal Hollie Salk, NP 10:03 AM [facility]

## 2022-06-23 ENCOUNTER — Telehealth: Payer: Self-pay

## 2022-06-23 ENCOUNTER — Encounter: Payer: Self-pay | Admitting: Nurse Practitioner

## 2022-06-23 ENCOUNTER — Other Ambulatory Visit: Payer: Self-pay | Admitting: Nurse Practitioner

## 2022-06-23 ENCOUNTER — Ambulatory Visit: Payer: 59 | Admitting: Nurse Practitioner

## 2022-06-23 VITALS — BP 110/74 | HR 66 | Temp 97.5°F | Ht 65.0 in | Wt 210.2 lb

## 2022-06-23 DIAGNOSIS — R7309 Other abnormal glucose: Secondary | ICD-10-CM

## 2022-06-23 DIAGNOSIS — F5101 Primary insomnia: Secondary | ICD-10-CM

## 2022-06-23 DIAGNOSIS — E66812 Obesity, class 2: Secondary | ICD-10-CM

## 2022-06-23 DIAGNOSIS — R252 Cramp and spasm: Secondary | ICD-10-CM

## 2022-06-23 DIAGNOSIS — E782 Mixed hyperlipidemia: Secondary | ICD-10-CM | POA: Diagnosis not present

## 2022-06-23 DIAGNOSIS — F411 Generalized anxiety disorder: Secondary | ICD-10-CM

## 2022-06-23 DIAGNOSIS — Z79899 Other long term (current) drug therapy: Secondary | ICD-10-CM | POA: Diagnosis not present

## 2022-06-23 DIAGNOSIS — Z1329 Encounter for screening for other suspected endocrine disorder: Secondary | ICD-10-CM | POA: Diagnosis not present

## 2022-06-23 DIAGNOSIS — E559 Vitamin D deficiency, unspecified: Secondary | ICD-10-CM | POA: Diagnosis not present

## 2022-06-23 DIAGNOSIS — I10 Essential (primary) hypertension: Secondary | ICD-10-CM

## 2022-06-23 DIAGNOSIS — Z0001 Encounter for general adult medical examination with abnormal findings: Secondary | ICD-10-CM

## 2022-06-23 DIAGNOSIS — M545 Low back pain, unspecified: Secondary | ICD-10-CM

## 2022-06-23 DIAGNOSIS — I700 Atherosclerosis of aorta: Secondary | ICD-10-CM

## 2022-06-23 DIAGNOSIS — Z0000 Encounter for general adult medical examination without abnormal findings: Secondary | ICD-10-CM | POA: Diagnosis not present

## 2022-06-23 DIAGNOSIS — B009 Herpesviral infection, unspecified: Secondary | ICD-10-CM

## 2022-06-23 DIAGNOSIS — Z136 Encounter for screening for cardiovascular disorders: Secondary | ICD-10-CM

## 2022-06-23 DIAGNOSIS — G4733 Obstructive sleep apnea (adult) (pediatric): Secondary | ICD-10-CM

## 2022-06-23 DIAGNOSIS — Z1389 Encounter for screening for other disorder: Secondary | ICD-10-CM

## 2022-06-23 DIAGNOSIS — R232 Flushing: Secondary | ICD-10-CM

## 2022-06-23 DIAGNOSIS — K5900 Constipation, unspecified: Secondary | ICD-10-CM

## 2022-06-23 DIAGNOSIS — F331 Major depressive disorder, recurrent, moderate: Secondary | ICD-10-CM

## 2022-06-23 MED ORDER — TRULICITY 4.5 MG/0.5ML ~~LOC~~ SOAJ
4.5000 mg | SUBCUTANEOUS | 3 refills | Status: DC
Start: 2022-06-23 — End: 2022-09-21

## 2022-06-23 MED ORDER — VEOZAH 45 MG PO TABS
45.0000 mg | ORAL_TABLET | Freq: Every day | ORAL | 5 refills | Status: DC
Start: 2022-06-23 — End: 2022-06-23

## 2022-06-23 MED ORDER — ESCITALOPRAM OXALATE 20 MG PO TABS
20.0000 mg | ORAL_TABLET | Freq: Every day | ORAL | 2 refills | Status: DC
Start: 2022-06-23 — End: 2022-09-23

## 2022-06-23 MED ORDER — VEOZAH 45 MG PO TABS
45.00 mg | ORAL_TABLET | Freq: Every day | ORAL | 5 refills | Status: DC
Start: 2022-06-23 — End: 2022-08-31

## 2022-06-23 MED ORDER — LINACLOTIDE 72 MCG PO CAPS
72.0000 ug | ORAL_CAPSULE | Freq: Every day | ORAL | 1 refills | Status: DC
Start: 2022-06-23 — End: 2023-03-31

## 2022-06-23 NOTE — Telephone Encounter (Signed)
Trulicity prior auth completed and submitted

## 2022-06-23 NOTE — Telephone Encounter (Signed)
Linzess prior auth completed and submitted

## 2022-06-23 NOTE — Patient Instructions (Signed)

## 2022-06-23 NOTE — Addendum Note (Signed)
Addended by: Anda Kraft E on: 06/23/2022 02:17 PM   Modules accepted: Orders

## 2022-06-23 NOTE — Telephone Encounter (Signed)
Veozah prior auth completed and submitted.

## 2022-06-24 LAB — COMPLETE METABOLIC PANEL WITH GFR
AG Ratio: 1.8 (calc) (ref 1.0–2.5)
ALT: 20 U/L (ref 6–29)
AST: 19 U/L (ref 10–35)
Albumin: 4.6 g/dL (ref 3.6–5.1)
Alkaline phosphatase (APISO): 67 U/L (ref 37–153)
BUN: 19 mg/dL (ref 7–25)
CO2: 27 mmol/L (ref 20–32)
Calcium: 9.5 mg/dL (ref 8.6–10.4)
Chloride: 103 mmol/L (ref 98–110)
Creat: 0.85 mg/dL (ref 0.50–1.03)
Globulin: 2.5 g/dL (calc) (ref 1.9–3.7)
Glucose, Bld: 85 mg/dL (ref 65–99)
Potassium: 4.2 mmol/L (ref 3.5–5.3)
Sodium: 139 mmol/L (ref 135–146)
Total Bilirubin: 0.5 mg/dL (ref 0.2–1.2)
Total Protein: 7.1 g/dL (ref 6.1–8.1)
eGFR: 80 mL/min/{1.73_m2} (ref >=60)

## 2022-06-24 LAB — CBC WITH DIFFERENTIAL/PLATELET
Absolute Monocytes: 400 cells/uL (ref 200–950)
Basophils Absolute: 52 cells/uL (ref 0–200)
Basophils Relative: 0.9 %
Eosinophils Absolute: 122 cells/uL (ref 15–500)
Eosinophils Relative: 2.1 %
HCT: 38.6 % (ref 35.0–45.0)
Hemoglobin: 12.8 g/dL (ref 11.7–15.5)
Lymphs Abs: 1491 cells/uL (ref 850–3900)
MCH: 29.6 pg (ref 27.0–33.0)
MCHC: 33.2 g/dL (ref 32.0–36.0)
MCV: 89.1 fL (ref 80.0–100.0)
MPV: 10.9 fL (ref 7.5–12.5)
Monocytes Relative: 6.9 %
Neutro Abs: 3735 cells/uL (ref 1500–7800)
Neutrophils Relative %: 64.4 %
Platelets: 224 10*3/uL (ref 140–400)
RBC: 4.33 10*6/uL (ref 3.80–5.10)
RDW: 13 % (ref 11.0–15.0)
Total Lymphocyte: 25.7 %
WBC: 5.8 10*3/uL (ref 3.8–10.8)

## 2022-06-24 LAB — URINALYSIS, ROUTINE W REFLEX MICROSCOPIC
Bilirubin Urine: NEGATIVE
Glucose, UA: NEGATIVE
Hgb urine dipstick: NEGATIVE
Ketones, ur: NEGATIVE
Leukocytes,Ua: NEGATIVE
Nitrite: NEGATIVE
Protein, ur: NEGATIVE
Specific Gravity, Urine: 1.019 (ref 1.001–1.035)
pH: 5.5 (ref 5.0–8.0)

## 2022-06-24 LAB — HEMOGLOBIN A1C
Hgb A1c MFr Bld: 5.4 % of total Hgb (ref <5.7)
Mean Plasma Glucose: 108 mg/dL
eAG (mmol/L): 6 mmol/L

## 2022-06-24 LAB — LIPID PANEL
Cholesterol: 181 mg/dL (ref <200)
HDL: 77 mg/dL (ref >=50)
LDL Cholesterol (Calc): 85 mg/dL (calc)
Non-HDL Cholesterol (Calc): 104 mg/dL (calc) (ref <130)
Total CHOL/HDL Ratio: 2.4 (calc) (ref <5.0)
Triglycerides: 96 mg/dL (ref <150)

## 2022-06-24 LAB — TSH: TSH: 2 mIU/L (ref 0.40–4.50)

## 2022-06-24 LAB — MICROALBUMIN / CREATININE URINE RATIO
Creatinine, Urine: 81 mg/dL (ref 20–275)
Microalb, Ur: <0.2 mg/dL

## 2022-06-24 LAB — MAGNESIUM: Magnesium: 2 mg/dL (ref 1.5–2.5)

## 2022-06-24 LAB — VITAMIN D 25 HYDROXY (VIT D DEFICIENCY, FRACTURES): Vit D, 25-Hydroxy: 76 ng/mL (ref 30–100)

## 2022-06-24 NOTE — Telephone Encounter (Signed)
Prior auth approved through 06/23/23 

## 2022-06-24 NOTE — Telephone Encounter (Signed)
Prior auth approved through 06/23/23

## 2022-06-24 NOTE — Telephone Encounter (Signed)
Prior auth denied. 

## 2022-07-26 ENCOUNTER — Other Ambulatory Visit: Payer: Self-pay | Admitting: Cardiovascular Disease

## 2022-08-21 ENCOUNTER — Ambulatory Visit: Payer: 59 | Admitting: Nurse Practitioner

## 2022-08-24 ENCOUNTER — Other Ambulatory Visit: Payer: Self-pay | Admitting: Nurse Practitioner

## 2022-08-24 ENCOUNTER — Other Ambulatory Visit: Payer: Self-pay | Admitting: Cardiovascular Disease

## 2022-08-24 DIAGNOSIS — F411 Generalized anxiety disorder: Secondary | ICD-10-CM

## 2022-08-24 DIAGNOSIS — E782 Mixed hyperlipidemia: Secondary | ICD-10-CM

## 2022-08-25 ENCOUNTER — Encounter: Payer: Self-pay | Admitting: Nurse Practitioner

## 2022-08-25 NOTE — Progress Notes (Signed)
Assessment and Plan:  Wilmetta was seen today for depression.  Diagnoses and all orders for this visit:  Anxiety state/ Current severe episode of major depressive disorder without psychotic features, unspecified whether recurrent (HCC) Start wellbutrin 150 mg  daily with Lexapro       Start Buspar 5 mg three times a day to control anxiety Continue lexapro daily and Alprazolam only as needed if buspar is not controlling anxiety Continue therapy Follow up in 2 weeks with me Instructed patient to contact office or on-call physician promptly should condition worsen or any new symptoms appear. IF THE PATIENT HAS ANY SUICIDAL OR HOMICIDAL IDEATIONS, CALL THE OFFICE, DISCUSS WITH A SUPPORT MEMBER, OR GO TO THE ER IMMEDIATELY. Patient was agreeable with this plan.     Class 2 obesity due to excess calories without serious comorbidity with body mass index (BMI) of 39.0 to 39.9 in adult Continue to try to focus on diet and exercise Continue Trulicity but space out since currently eating little         Further disposition pending results of labs. Discussed med's effects and SE's.   Over 30 minutes of exam, counseling, chart review, and critical decision making was performed. 5  Future Appointments  Date Time Provider Department Center  09/16/2022 11:00 AM Raynelle Dick, NP [facility] None  12/29/2022 10:30 AM Raynelle Dick, NP [facility] None  06/23/2023 10:00 AM Raynelle Dick, NP [facility] None    ------------------------------------------------------------------------------------------------------------------   HPI BP 138/82   Pulse 75   Temp 97.7 F (36.5 C)   Ht 5\' 5"  (1.651 m)   Wt 211 lb (95.7 kg)   SpO2 96%   BMI 35.11 kg/m  57 y.o.female presents for depression.  She had a very bad break up with her boyfriend.  She is getting panic attacks and feeling overwhelmed. She has been using 1/2 of Alprazolam 0.5 mg.  She continues to meet with her therapist and has no  thoughts of hurting self or have a plan currently. Her therapist and friend check in on her daily. The boyfriend is bipolar. She is scared of being alone, no longer someone to help her at the house. She is currently on Lexapro 20 mg daily. Was on Wellbutrin in association with this medication in the past and worked well.  Does have more anxiety and does not wish to use Xanax more. She does have a puppy that is 53 months old- [place].   BP is currently controlled on Valsartan 160 mg every day. Was having a panic attack on her way here.  BP Readings from Last 3 Encounters:  08/31/22 138/82  06/23/22 110/74  09/29/21 (!) 140/90    BMI is Body mass index is 35.11 kg/m., she has been working on diet and exercise. Wt Readings from Last 3 Encounters:  08/31/22 211 lb (95.7 kg)  06/23/22 210 lb 3.2 oz (95.3 kg)  09/29/21 229 lb 12.8 oz (104.2 kg)      08/31/2022   12:05 PM 04/10/2015    5:46 PM  Depression screen PHQ 2/9  Decreased Interest 1 0  Down, Depressed, Hopeless 2 0  PHQ - 2 Score 3 0  Altered sleeping 0   Tired, decreased energy 0   Change in appetite 2   Feeling bad or failure about yourself  0   Trouble concentrating 0   Moving slowly or fidgety/restless 0   Suicidal thoughts 0   PHQ-9 Score 5   Difficult doing work/chores Very difficult  Past Medical History:  Diagnosis Date   Allergy    Anemia    Anxiety    Arthritis    KNEES,FOOT   ASCUS (atypical squamous cells of undetermined significance) on Pap smear    neg HR HPV 09/2008, ascus 06/2009,10/2009   Cataract    "MILD"   Fatty liver 08/2018   ultrasound   Fatty liver    HSV-2 infection    Hypercholesteremia    Hypertension    OSA on CPAP    Pes planus of both feet 06/20/2013   Prediabetes    Sleep apnea      Allergies  Allergen Reactions   Doxycycline Hives and Shortness Of Breath   Ace Inhibitors     cough   Adhesive [Tape]    Augmentin [Amoxicillin-Pot Clavulanate] Nausea Only    diarrhea    Clindamycin/Lincomycin    Epinephrine     Other reaction(s): Nausea, Headache   Epinephrine-Lidocaine-Na Metabisulfite [Lidocaine-Epinephrine]     Has heart palpitations from the epi, wants without   Levaquin [Levofloxacin] Other (See Comments)    Dysphoria   Vicodin [Hydrocodone-Acetaminophen] Itching    Current Outpatient Medications on File Prior to Visit  Medication Sig   ALPRAZolam (XANAX) 0.5 MG tablet TAKE ONE TABLET BY MOUTH AT BEDTIME AS NEEDED   Dulaglutide (TRULICITY) 4.5 MG/0.5ML SOPN Inject 4.5 mg as directed once a week.   escitalopram (LEXAPRO) 20 MG tablet Take 1 tablet (20 mg total) by mouth daily.   fluticasone (FLONASE) 50 MCG/ACT nasal spray Place into both nostrils daily.   Multiple Vitamin (MULTIVITAMIN ADULT PO) Take by mouth. Life Extension One-Per-Day   promethazine (PHENERGAN) 25 MG tablet TAKE ONE TO TWO TABLETS BY MOUTH EVERY NIGHT AT BEDTIME AS NEEDED FOR SLEEP   rosuvastatin (CRESTOR) 10 MG tablet TAKE 1 TABLET BY MOUTH DAILY FOR CHOLESTEROL   tretinoin (RETIN-A) 0.025 % cream Apply topically daily.   valsartan (DIOVAN) 160 MG tablet Take 1 tablet (160 mg total) by mouth daily. Pt needs to be seen in office for further refills. 2nd attempt   linaclotide (LINZESS) 72 MCG capsule Take 1 capsule (72 mcg total) by mouth daily before breakfast. (Patient not taking: Reported on 08/31/2022)   traZODone (DESYREL) 50 MG tablet Take 1 tablet (50 mg total) by mouth at bedtime. prn (Patient not taking: Reported on 08/31/2022)   valACYclovir (VALTREX) 500 MG tablet TAKE ONE TO TWO TABLETS BY MOUTH DAILY FOR FEVER BLISTER (Patient not taking: Reported on 08/31/2022)   No current facility-administered medications on file prior to visit.    ROS: all negative except above.   Physical Exam:  BP 138/82   Pulse 75   Temp 97.7 F (36.5 C)   Ht 5\' 5"  (1.651 m)   Wt 211 lb (95.7 kg)   SpO2 96%   BMI 35.11 kg/m   General Appearance: Crying depressed female Eyes: PERRLA,  EOMs, conjunctiva no swelling or erythema Sinuses: No Frontal/maxillary tenderness ENT/Mouth: Ext aud canals clear, TMs without erythema, bulging. No erythema, swelling, or exudate on post pharynx.  Tonsils not swollen or erythematous. Hearing normal.  Neck: Supple, thyroid normal.  Respiratory: Respiratory effort normal, BS equal bilaterally without rales, rhonchi, wheezing or stridor.  Cardio: RRR with no MRGs. Brisk peripheral pulses without edema.  Abdomen: Soft, + BS.  Non tender, no guarding, rebound, hernias, masses. Lymphatics: Non tender without lymphadenopathy.  Musculoskeletal: Full ROM, 5/5 strength, normal gait.  Skin: Warm, dry without rashes, lesions, ecchymosis.  Neuro: Cranial nerves intact.  Normal muscle tone, no cerebellar symptoms. Sensation intact.  Psych: Awake and oriented X 3, depressed affect, Insight and Judgment appropriate.     Raynelle Dick, NP 12:14 PM [facility]

## 2022-08-28 DIAGNOSIS — M79672 Pain in left foot: Secondary | ICD-10-CM | POA: Diagnosis not present

## 2022-08-28 DIAGNOSIS — M67911 Unspecified disorder of synovium and tendon, right shoulder: Secondary | ICD-10-CM | POA: Diagnosis not present

## 2022-08-31 ENCOUNTER — Ambulatory Visit (INDEPENDENT_AMBULATORY_CARE_PROVIDER_SITE_OTHER): Payer: 59 | Admitting: Nurse Practitioner

## 2022-08-31 ENCOUNTER — Encounter: Payer: Self-pay | Admitting: Nurse Practitioner

## 2022-08-31 VITALS — BP 138/82 | HR 75 | Temp 97.7°F | Ht 65.0 in | Wt 211.0 lb

## 2022-08-31 DIAGNOSIS — E6609 Other obesity due to excess calories: Secondary | ICD-10-CM | POA: Diagnosis not present

## 2022-08-31 DIAGNOSIS — F411 Generalized anxiety disorder: Secondary | ICD-10-CM | POA: Diagnosis not present

## 2022-08-31 DIAGNOSIS — F322 Major depressive disorder, single episode, severe without psychotic features: Secondary | ICD-10-CM

## 2022-08-31 MED ORDER — BUPROPION HCL ER (XL) 150 MG PO TB24
150.0000 mg | ORAL_TABLET | Freq: Every morning | ORAL | 1 refills | Status: DC
Start: 2022-08-31 — End: 2023-03-31

## 2022-08-31 MED ORDER — BUSPIRONE HCL 5 MG PO TABS
5.0000 mg | ORAL_TABLET | Freq: Three times a day (TID) | ORAL | 2 refills | Status: DC
Start: 2022-08-31 — End: 2022-10-20

## 2022-08-31 NOTE — Patient Instructions (Signed)
Start wellbutrin 150 mg  daily with Lexapro  Start Buspar 5 mg three times a day to control anxiety  Continue lexapro daily and Alprazolam only as needed if buspar is not controlling anxiety  Continue therapy Follow up in 2 weeks with me  Instructed patient to contact office or on-call physician promptly should condition worsen or any new symptoms appear. IF THE PATIENT HAS ANY SUICIDAL OR HOMICIDAL IDEATIONS, CALL THE OFFICE, DISCUSS WITH A SUPPORT MEMBER, OR GO TO THE ER IMMEDIATELY. Patient was agreeable with this plan.   Major Depressive Disorder, Adult Major depressive disorder (MDD) is a mental health condition. People with this disorder feel very sad, hopeless, and lose interest in things. Symptoms last most of the day, almost every day, for 2 weeks. MDD can affect: Relationships. Work and school. Things you usually like to do. What are the causes? The cause of MDD is not known. What increases the risk? Having family members with depression. Being female. Family problems. Alcohol or drug misuse. A lot of stress in your life, such as from: Living without basic needs such as food and housing. Being treated poorly because of race, sex, or religion (discrimination). Things that caused you pain as a child, especially if you lost a parent or were abused. Health and mental problems that you have had for a long time. What are the signs or symptoms? The main symptoms of this condition are: Being sad all the time. Being grouchy (irritable) all the time. Not enjoying the things you usually like. Sleeping too much or too little. Eating too much or too little. Feeling tired. Other symptoms include: Gaining or losing weight, without knowing why. Being restless and weak. Feeling hopeless, worthless, or guilty. Trouble thinking or making decisions. Thoughts of hurting yourself or others, or thoughts of ending your life. Spending a lot of time alone. Being unable to do daily  tasks. If you have very bad MDD, you may: Believe things that are not true. Hear, see, taste, or feel things that are not there. Have mild depression that lasts for at least 2 years. Feel very sad and hopeless. Have trouble speaking or moving. Feel very sad during some seasons. How is this treated? Talk therapy. This teaches you about thoughts, feelings, and actions and how to change them. This can also help you to talk with others. This can be done with members of your family. Medicines. Lifestyle changes. You may need to: Limit alcohol use. Stop using drugs, if you use them. Exercise. Get plenty of sleep. Eat healthy. Spend more time outdoors. Brain stimulation. This may be done when symptoms are very bad or have not gotten better. Follow these instructions at home: Alcohol use Do not drink alcohol if: Your health care provider tells you not to drink. You are pregnant, may be pregnant, or are planning to become pregnant. If you drink alcohol: Limit how much you use to: 0-1 drink a day for women. 0-2 drinks a day for men. Know how much alcohol is in your drink. In the [place] one drink equals one 12 oz bottle of beer (355 mL), one 5 oz glass of wine (148 mL), or one 1 oz glass of hard liquor (44 mL). Activity Exercise as told by your doctor. Spend time outdoors. Make time to do the things you enjoy. Find ways to deal with stress. Try to: Meditate. Do deep breathing. Spend time in nature. Keep a journal. Return to your normal activities when your doctor says that it is  safe. General instructions  Take over-the-counter and prescription medicines only as told by your doctor. Talk to your doctor about: Alcohol use. It can affect medicines. Any drug use. Eat healthy foods. Get a lot of sleep. Think about joining a support group. Ask your doctor about that. Keep all follow-up visits. Your doctor will need to check on your mood, behavior, and medicines, and change your  treatment as needed. Where to find more information: [employer] Mental Illness: [employer] of Mental Health: [employer].com American Psychiatric Association: psychiatry.org Contact a doctor if: You feel worse. You get new symptoms. Get help right away if: You hurt yourself on purpose (self-harm). You have thoughts about hurting yourself or others. You see, hear, taste, smell, or feel things that are not there. Get help right away if you feel like you may hurt yourself or others, or have thoughts about taking your own life. Go to your nearest emergency room or: Call 911. Call the National Suicide Prevention Lifeline at (863)014-1054 or 988. This is open 24 hours a day. Text the Crisis Text Line at 309-163-4715. This information is not intended to replace advice given to you by your health care provider. Make sure you discuss any questions you have with your health care provider. Document Revised: 06/17/2021 Document Reviewed: 06/17/2021 Elsevier Patient Education  2024 [employer].

## 2022-09-10 DIAGNOSIS — N7689 Other specified inflammation of vagina and vulva: Secondary | ICD-10-CM | POA: Diagnosis not present

## 2022-09-10 DIAGNOSIS — N952 Postmenopausal atrophic vaginitis: Secondary | ICD-10-CM | POA: Diagnosis not present

## 2022-09-10 DIAGNOSIS — N898 Other specified noninflammatory disorders of vagina: Secondary | ICD-10-CM | POA: Diagnosis not present

## 2022-09-15 NOTE — Progress Notes (Deleted)
Assessment and Plan:  Detta was seen today for depression.  Diagnoses and all orders for this visit:  Anxiety state/ Current severe episode of major depressive disorder without psychotic features, unspecified whether recurrent (HCC) Start wellbutrin 150 mg  daily with Lexapro       Start Buspar 5 mg three times a day to control anxiety Continue lexapro daily and Alprazolam only as needed if buspar is not controlling anxiety Continue therapy Follow up in 2 weeks with me Instructed patient to contact office or on-call physician promptly should condition worsen or any new symptoms appear. IF THE PATIENT HAS ANY SUICIDAL OR HOMICIDAL IDEATIONS, CALL THE OFFICE, DISCUSS WITH A SUPPORT MEMBER, OR GO TO THE ER IMMEDIATELY. Patient was agreeable with this plan.     Class 2 obesity due to excess calories without serious comorbidity with body mass index (BMI) of 39.0 to 39.9 in adult Continue to try to focus on diet and exercise Continue Trulicity but space out since currently eating little         Further disposition pending results of labs. Discussed med's effects and SE's.   Over 30 minutes of exam, counseling, chart review, and critical decision making was performed. 5  Future Appointments  Date Time Provider Department Center  09/16/2022 11:00 AM Raynelle Dick, NP [facility] None  12/29/2022 10:30 AM Raynelle Dick, NP [facility] None  06/23/2023 10:00 AM Raynelle Dick, NP [facility] None    ------------------------------------------------------------------------------------------------------------------   HPI There were no vitals taken for this visit. 57 y.o.female presents for depression.  She had a very bad break up with her boyfriend.  She is getting panic attacks and feeling overwhelmed. She has been using 1/2 of Alprazolam 0.5 mg.  She continues to meet with her therapist and has no thoughts of hurting self or have a plan currently. Her therapist and friend check  in on her daily. The boyfriend is bipolar. She is scared of being alone, no longer someone to help her at the house. She is currently on Lexapro 20 mg daily. Was on Wellbutrin in association with this medication in the past and worked well.  Does have more anxiety and does not wish to use Xanax more. She does have a puppy that is 53 months old- [place].   BP is currently controlled on Valsartan 160 mg every day. Was having a panic attack on her way here.  BP Readings from Last 3 Encounters:  08/31/22 138/82  06/23/22 110/74  09/29/21 (!) 140/90    BMI is There is no height or weight on file to calculate BMI., she has been working on diet and exercise. Wt Readings from Last 3 Encounters:  08/31/22 211 lb (95.7 kg)  06/23/22 210 lb 3.2 oz (95.3 kg)  09/29/21 229 lb 12.8 oz (104.2 kg)      08/31/2022   12:05 PM 04/10/2015    5:46 PM  Depression screen PHQ 2/9  Decreased Interest 1 0  Down, Depressed, Hopeless 2 0  PHQ - 2 Score 3 0  Altered sleeping 0   Tired, decreased energy 0   Change in appetite 2   Feeling bad or failure about yourself  0   Trouble concentrating 0   Moving slowly or fidgety/restless 0   Suicidal thoughts 0   PHQ-9 Score 5   Difficult doing work/chores Very difficult      Past Medical History:  Diagnosis Date   Allergy    Anemia    Anxiety  Arthritis    KNEES,FOOT   ASCUS (atypical squamous cells of undetermined significance) on Pap smear    neg HR HPV 09/2008, ascus 06/2009,10/2009   Cataract    "MILD"   Fatty liver 08/2018   ultrasound   Fatty liver    HSV-2 infection    Hypercholesteremia    Hypertension    OSA on CPAP    Pes planus of both feet 06/20/2013   Prediabetes    Sleep apnea      Allergies  Allergen Reactions   Doxycycline Hives and Shortness Of Breath   Ace Inhibitors     cough   Adhesive [Tape]    Augmentin [Amoxicillin-Pot Clavulanate] Nausea Only    diarrhea   Clindamycin/Lincomycin    Epinephrine     Other reaction(s):  Nausea, Headache   Epinephrine-Lidocaine-Na Metabisulfite [Lidocaine-Epinephrine]     Has heart palpitations from the epi, wants without   Levaquin [Levofloxacin] Other (See Comments)    Dysphoria   Vicodin [Hydrocodone-Acetaminophen] Itching    Current Outpatient Medications on File Prior to Visit  Medication Sig   ALPRAZolam (XANAX) 0.5 MG tablet TAKE ONE TABLET BY MOUTH AT BEDTIME AS NEEDED   buPROPion (WELLBUTRIN XL) 150 MG 24 hr tablet Take 1 tablet (150 mg total) by mouth every morning.   busPIRone (BUSPAR) 5 MG tablet Take 1 tablet (5 mg total) by mouth 3 (three) times daily.   Dulaglutide (TRULICITY) 4.5 MG/0.5ML SOPN Inject 4.5 mg as directed once a week.   escitalopram (LEXAPRO) 20 MG tablet Take 1 tablet (20 mg total) by mouth daily.   fluticasone (FLONASE) 50 MCG/ACT nasal spray Place into both nostrils daily.   linaclotide (LINZESS) 72 MCG capsule Take 1 capsule (72 mcg total) by mouth daily before breakfast. (Patient not taking: Reported on 08/31/2022)   Multiple Vitamin (MULTIVITAMIN ADULT PO) Take by mouth. Life Extension One-Per-Day   promethazine (PHENERGAN) 25 MG tablet TAKE ONE TO TWO TABLETS BY MOUTH EVERY NIGHT AT BEDTIME AS NEEDED FOR SLEEP   rosuvastatin (CRESTOR) 10 MG tablet TAKE 1 TABLET BY MOUTH DAILY FOR CHOLESTEROL   traZODone (DESYREL) 50 MG tablet Take 1 tablet (50 mg total) by mouth at bedtime. prn (Patient not taking: Reported on 08/31/2022)   tretinoin (RETIN-A) 0.025 % cream Apply topically daily.   valACYclovir (VALTREX) 500 MG tablet TAKE ONE TO TWO TABLETS BY MOUTH DAILY FOR FEVER BLISTER (Patient not taking: Reported on 08/31/2022)   valsartan (DIOVAN) 160 MG tablet Take 1 tablet (160 mg total) by mouth daily. Pt needs to be seen in office for further refills. 2nd attempt   No current facility-administered medications on file prior to visit.    ROS: all negative except above.   Physical Exam:  There were no vitals taken for this visit.  General  Appearance: Crying depressed female Eyes: PERRLA, EOMs, conjunctiva no swelling or erythema Sinuses: No Frontal/maxillary tenderness ENT/Mouth: Ext aud canals clear, TMs without erythema, bulging. No erythema, swelling, or exudate on post pharynx.  Tonsils not swollen or erythematous. Hearing normal.  Neck: Supple, thyroid normal.  Respiratory: Respiratory effort normal, BS equal bilaterally without rales, rhonchi, wheezing or stridor.  Cardio: RRR with no MRGs. Brisk peripheral pulses without edema.  Abdomen: Soft, + BS.  Non tender, no guarding, rebound, hernias, masses. Lymphatics: Non tender without lymphadenopathy.  Musculoskeletal: Full ROM, 5/5 strength, normal gait.  Skin: Warm, dry without rashes, lesions, ecchymosis.  Neuro: Cranial nerves intact. Normal muscle tone, no cerebellar symptoms. Sensation intact.  Psych:  Awake and oriented X 3, depressed affect, Insight and Judgment appropriate.     Raynelle Dick, NP 12:05 PM [facility]

## 2022-09-16 ENCOUNTER — Ambulatory Visit: Payer: 59 | Admitting: Nurse Practitioner

## 2022-09-19 NOTE — Progress Notes (Unsigned)
Assessment and Plan:  Wendy Levine was seen today for depression.  Diagnoses and all orders for this visit:  Anxiety state/ Current severe episode of major depressive disorder without psychotic features, unspecified whether recurrent (HCC) Start wellbutrin 150 mg  daily with Lexapro       Start Buspar 5 mg three times a day to control anxiety Continue lexapro daily and Alprazolam only as needed if buspar is not controlling anxiety Continue therapy Follow up in 2 weeks with me Instructed patient to contact office or on-call physician promptly should condition worsen or any new symptoms appear. IF THE PATIENT HAS ANY SUICIDAL OR HOMICIDAL IDEATIONS, CALL THE OFFICE, DISCUSS WITH A SUPPORT MEMBER, OR GO TO THE ER IMMEDIATELY. Patient was agreeable with this plan.     Class 2 obesity due to excess calories without serious comorbidity with body mass index (BMI) of 39.0 to 39.9 in adult Continue to try to focus on diet and exercise Continue Trulicity but space out since currently eating little         Further disposition pending results of labs. Discussed med's effects and SE's.   Over 30 minutes of exam, counseling, chart review, and critical decision making was performed. 5  Future Appointments  Date Time Provider Department Center  09/21/2022  4:15 PM Raynelle Dick, NP [facility] None  12/29/2022 10:30 AM Raynelle Dick, NP [facility] None  06/23/2023 10:00 AM Raynelle Dick, NP [facility] None    ------------------------------------------------------------------------------------------------------------------   HPI There were no vitals taken for this visit. 57 y.o.female presents for depression.  She had a very bad break up with her boyfriend.  She is getting panic attacks and feeling overwhelmed. She has been using 1/2 of Alprazolam 0.5 mg.  She continues to meet with her therapist and has no thoughts of hurting self or have a plan currently. Her therapist and friend check  in on her daily. The boyfriend is bipolar. She is scared of being alone, no longer someone to help her at the house. She is currently on Lexapro 20 mg daily. Was on Wellbutrin in association with this medication in the past and worked well.  Does have more anxiety and does not wish to use Xanax more. She does have a puppy that is 62 months old- [place].   BP is currently controlled on Valsartan 160 mg every day. Was having a panic attack on her way here.  BP Readings from Last 3 Encounters:  08/31/22 138/82  06/23/22 110/74  09/29/21 (!) 140/90    BMI is There is no height or weight on file to calculate BMI., she has been working on diet and exercise. Wt Readings from Last 3 Encounters:  08/31/22 211 lb (95.7 kg)  06/23/22 210 lb 3.2 oz (95.3 kg)  09/29/21 229 lb 12.8 oz (104.2 kg)      08/31/2022   12:05 PM 04/10/2015    5:46 PM  Depression screen PHQ 2/9  Decreased Interest 1 0  Down, Depressed, Hopeless 2 0  PHQ - 2 Score 3 0  Altered sleeping 0   Tired, decreased energy 0   Change in appetite 2   Feeling bad or failure about yourself  0   Trouble concentrating 0   Moving slowly or fidgety/restless 0   Suicidal thoughts 0   PHQ-9 Score 5   Difficult doing work/chores Very difficult      Past Medical History:  Diagnosis Date   Allergy    Anemia    Anxiety  Arthritis    KNEES,FOOT   ASCUS (atypical squamous cells of undetermined significance) on Pap smear    neg HR HPV 09/2008, ascus 06/2009,10/2009   Cataract    "MILD"   Fatty liver 08/2018   ultrasound   Fatty liver    HSV-2 infection    Hypercholesteremia    Hypertension    OSA on CPAP    Pes planus of both feet 06/20/2013   Prediabetes    Sleep apnea      Allergies  Allergen Reactions   Doxycycline Hives and Shortness Of Breath   Ace Inhibitors     cough   Adhesive [Tape]    Augmentin [Amoxicillin-Pot Clavulanate] Nausea Only    diarrhea   Clindamycin/Lincomycin    Epinephrine     Other reaction(s):  Nausea, Headache   Epinephrine-Lidocaine-Na Metabisulfite [Lidocaine-Epinephrine]     Has heart palpitations from the epi, wants without   Levaquin [Levofloxacin] Other (See Comments)    Dysphoria   Vicodin [Hydrocodone-Acetaminophen] Itching    Current Outpatient Medications on File Prior to Visit  Medication Sig   ALPRAZolam (XANAX) 0.5 MG tablet TAKE ONE TABLET BY MOUTH AT BEDTIME AS NEEDED   buPROPion (WELLBUTRIN XL) 150 MG 24 hr tablet Take 1 tablet (150 mg total) by mouth every morning.   busPIRone (BUSPAR) 5 MG tablet Take 1 tablet (5 mg total) by mouth 3 (three) times daily.   Dulaglutide (TRULICITY) 4.5 MG/0.5ML SOPN Inject 4.5 mg as directed once a week.   escitalopram (LEXAPRO) 20 MG tablet Take 1 tablet (20 mg total) by mouth daily.   fluticasone (FLONASE) 50 MCG/ACT nasal spray Place into both nostrils daily.   linaclotide (LINZESS) 72 MCG capsule Take 1 capsule (72 mcg total) by mouth daily before breakfast. (Patient not taking: Reported on 08/31/2022)   Multiple Vitamin (MULTIVITAMIN ADULT PO) Take by mouth. Life Extension One-Per-Day   promethazine (PHENERGAN) 25 MG tablet TAKE ONE TO TWO TABLETS BY MOUTH EVERY NIGHT AT BEDTIME AS NEEDED FOR SLEEP   rosuvastatin (CRESTOR) 10 MG tablet TAKE 1 TABLET BY MOUTH DAILY FOR CHOLESTEROL   traZODone (DESYREL) 50 MG tablet Take 1 tablet (50 mg total) by mouth at bedtime. prn (Patient not taking: Reported on 08/31/2022)   tretinoin (RETIN-A) 0.025 % cream Apply topically daily.   valACYclovir (VALTREX) 500 MG tablet TAKE ONE TO TWO TABLETS BY MOUTH DAILY FOR FEVER BLISTER (Patient not taking: Reported on 08/31/2022)   valsartan (DIOVAN) 160 MG tablet Take 1 tablet (160 mg total) by mouth daily. Pt needs to be seen in office for further refills. 2nd attempt   No current facility-administered medications on file prior to visit.    ROS: all negative except above.   Physical Exam:  There were no vitals taken for this visit.  General  Appearance: Crying depressed female Eyes: PERRLA, EOMs, conjunctiva no swelling or erythema Sinuses: No Frontal/maxillary tenderness ENT/Mouth: Ext aud canals clear, TMs without erythema, bulging. No erythema, swelling, or exudate on post pharynx.  Tonsils not swollen or erythematous. Hearing normal.  Neck: Supple, thyroid normal.  Respiratory: Respiratory effort normal, BS equal bilaterally without rales, rhonchi, wheezing or stridor.  Cardio: RRR with no MRGs. Brisk peripheral pulses without edema.  Abdomen: Soft, + BS.  Non tender, no guarding, rebound, hernias, masses. Lymphatics: Non tender without lymphadenopathy.  Musculoskeletal: Full ROM, 5/5 strength, normal gait.  Skin: Warm, dry without rashes, lesions, ecchymosis.  Neuro: Cranial nerves intact. Normal muscle tone, no cerebellar symptoms. Sensation intact.  Psych:  Awake and oriented X 3, depressed affect, Insight and Judgment appropriate.     Raynelle Dick, NP 3:52 PM [facility]

## 2022-09-21 ENCOUNTER — Encounter: Payer: Self-pay | Admitting: Nurse Practitioner

## 2022-09-21 ENCOUNTER — Other Ambulatory Visit: Payer: Self-pay | Admitting: Cardiovascular Disease

## 2022-09-21 ENCOUNTER — Ambulatory Visit: Payer: 59 | Admitting: Nurse Practitioner

## 2022-09-21 VITALS — BP 108/72 | HR 65 | Temp 97.7°F | Ht 65.0 in | Wt 205.0 lb

## 2022-09-21 DIAGNOSIS — F411 Generalized anxiety disorder: Secondary | ICD-10-CM | POA: Diagnosis not present

## 2022-09-21 DIAGNOSIS — E6609 Other obesity due to excess calories: Secondary | ICD-10-CM | POA: Diagnosis not present

## 2022-09-21 DIAGNOSIS — F322 Major depressive disorder, single episode, severe without psychotic features: Secondary | ICD-10-CM | POA: Diagnosis not present

## 2022-09-21 DIAGNOSIS — G4733 Obstructive sleep apnea (adult) (pediatric): Secondary | ICD-10-CM

## 2022-09-21 DIAGNOSIS — Z6839 Body mass index (BMI) 39.0-39.9, adult: Secondary | ICD-10-CM | POA: Diagnosis not present

## 2022-09-21 DIAGNOSIS — R7309 Other abnormal glucose: Secondary | ICD-10-CM

## 2022-09-21 MED ORDER — PHENDIMETRAZINE TARTRATE 35 MG PO TABS: 35.0000 mg | ORAL_TABLET | Freq: Every day | ORAL | 0 refills | Status: DC

## 2022-09-21 MED ORDER — TRULICITY 4.5 MG/0.5ML ~~LOC~~ SOAJ: 4.5000 mg | SUBCUTANEOUS | 3 refills | Status: DC

## 2022-09-21 NOTE — Patient Instructions (Signed)
Phendimetrazine Tablets What is this medication? PHENDIMETRAZINE (fen dye MET ra zeen) promotes weight loss when other therapies have not worked or cannot be tolerated. It is often used for a short period of time. It works by decreasing appetite. This medicine may be used for other purposes; ask your health care provider or pharmacist if you have questions. COMMON BRAND NAME(S): Bock-Arate, Bontril PDM, Rapdone, Stabec-1 What should I tell my care team before I take this medication? They need to know if you have any of these conditions: Agitation or nervousness Diabetes Glaucoma Heart disease High blood pressure History of substance use disorder Kidney disease Lung disease called Primary Pulmonary Hypertension (PPH) Taken an MAOI like Carbex, Eldepryl, Marplan, Nardil, or Parnate in last 14 days Taking stimulant medications for attention disorders, weight loss, or to stay awake Thyroid disease An unusual or allergic reaction to phendimetrazine, other medications, foods, dyes, or preservatives Pregnant or trying to get pregnant Breast-feeding How should I use this medication? Take this medication by mouth with a glass of water. Follow the directions on the prescription label. Take this medication at least 1 hour before eating. Do not take your medication more often than directed. Do not suddenly stop taking your medication because you may develop a severe reaction. Your care team will tell you how much medication to take. If your care team wants you to stop the medication, the dose will be slowly lowered over time to avoid any side effects. Talk to your care team about the use of this medication in children. Special care may be needed. Overdosage: If you think you have taken too much of this medicine contact a poison control center or emergency room at once. NOTE: This medicine is only for you. Do not share this medicine with others. What if I miss a dose? If you miss a dose, take it as  soon as you can. If it is almost time for your next dose, take only that dose. Do not take double or extra doses. What may interact with this medication? Do not take this medication with any of the following: MAOIs like Carbex, Eldepryl, Marplan, Nardil, and Parnate Medications for colds or breathing difficulties like pseudoephedrine or phenylephrine Procarbazine Sibutramine Stimulant medications for attention disorders, weight loss, or to stay awake This medication may also interact with the following: Certain medications for depression, anxiety, or psychotic disturbances Linezolid Medications for diabetes Medications for high blood pressure This list may not describe all possible interactions. Give your health care provider a list of all the medicines, herbs, non-prescription drugs, or dietary supplements you use. Also tell them if you smoke, drink alcohol, or use illegal drugs. Some items may interact with your medicine. What should I watch for while using this medication? Notify your care team immediately if you become short of breath while doing your normal activities. Do not take this medication within 6 hours of bedtime. It can keep you from getting to sleep. Avoid drinks that contain caffeine and try to stick to a regular bedtime every night. This medication was intended to be used in addition to a healthy diet and exercise. The best results are achieved this way. This medication is only indicated for short-term use. Eventually your weight loss may level out. At that point, the medication will only help you maintain your new weight. Do not increase or in any way change your dose without consulting your care team. You may get drowsy or dizzy. Do not drive, use machinery, or do anything that  needs mental alertness until you know how this medication affects you. Do not stand or sit up quickly, especially if you are an older patient. This reduces the risk of dizzy or fainting spells. Alcohol  may increase dizziness and drowsiness. Avoid alcoholic drinks. What side effects may I notice from receiving this medication? Side effects that you should report to your care team as soon as possible: Allergic reactions--skin rash, itching, hives, swelling of the face, lips, tongue, or throat Heart valve disease--shortness of breath, chest pain, unusual weakness or fatigue, dizziness, feeling faint or lightheaded, fever, sudden weight gain, fast or irregular heartbeat Mood and behavior changes--anxiety, nervousness, confusion, hallucinations, irritability, hostility, thoughts of suicide or self-harm, worsening mood, feelings of depression Pulmonary hypertension--shortness of breath, chest pain, fast or irregular heartbeat, feeling faint or lightheaded, fatigue, swelling of the ankles or feet Side effects that usually do not require medical attention (report to your care team if they continue or are bothersome): Dizziness Dry mouth Headache Tremors or shaking Trouble sleeping This list may not describe all possible side effects. Call your doctor for medical advice about side effects. You may report side effects to FDA at 1-800-FDA-1088. Where should I keep my medication? Keep out of the reach of children and pets. This medication can be abused. Keep it in a safe place to protect it from theft. Do not share it with anyone. It is only for you. Selling or giving away this medication is dangerous and against the law. Store at room temperature between 15 and 30 degrees C (59 and 86 degrees F). This medication may cause harm and death if taken by other adults, children, or pets. It is important to get rid of the medication as soon as you no longer need it, or if it is expired. You can do this in two ways: Take the medication to a medication take-back program. Check with your pharmacy or law enforcement to find a location. If you cannot return the medication, check the label or package insert to see if  the medication should be thrown out in the garbage or flushed down the toilet. If you are not sure, ask your care team. If it is safe to put it in the trash, take the medication out of the container. Mix the medication with cat litter, dirt, coffee grounds, or other unwanted substance. Seal the mixture in a bag or container. Put it in the trash. NOTE: This sheet is a summary. It may not cover all possible information. If you have questions about this medicine, talk to your doctor, pharmacist, or health care provider.  2024 Elsevier/Gold Standard (2021-04-07 00:00:00)

## 2022-09-22 ENCOUNTER — Encounter: Payer: Self-pay | Admitting: Nurse Practitioner

## 2022-09-23 ENCOUNTER — Other Ambulatory Visit: Payer: Self-pay | Admitting: Nurse Practitioner

## 2022-09-23 DIAGNOSIS — F331 Major depressive disorder, recurrent, moderate: Secondary | ICD-10-CM

## 2022-09-23 DIAGNOSIS — I10 Essential (primary) hypertension: Secondary | ICD-10-CM

## 2022-09-23 DIAGNOSIS — F411 Generalized anxiety disorder: Secondary | ICD-10-CM

## 2022-09-23 MED ORDER — VALSARTAN 160 MG PO TABS: 160.0000 mg | ORAL_TABLET | Freq: Every day | ORAL | 1 refills | Status: DC

## 2022-10-06 ENCOUNTER — Other Ambulatory Visit: Payer: Self-pay

## 2022-10-06 DIAGNOSIS — I10 Essential (primary) hypertension: Secondary | ICD-10-CM

## 2022-10-06 MED ORDER — VALSARTAN 160 MG PO TABS
160.0000 mg | ORAL_TABLET | Freq: Every day | ORAL | 1 refills | Status: DC
Start: 2022-10-06 — End: 2023-03-29

## 2022-10-19 NOTE — Progress Notes (Unsigned)
Assessment and Plan:  There are no diagnoses linked to this encounter.    Further disposition pending results of labs. Discussed med's effects and SE's.   Over 30 minutes of exam, counseling, chart review, and critical decision making was performed.   Future Appointments  Date Time Provider Department Center  10/20/2022  2:45 PM Raynelle Dick, NP [facility] None  12/29/2022 10:30 AM Raynelle Dick, NP [facility] None  02/15/2023 11:20 AM Croitoru, Rachelle Hora, MD [facility] None  06/23/2023 10:00 AM Raynelle Dick, NP [facility] None    ------------------------------------------------------------------------------------------------------------------   HPI There were no vitals taken for this visit. 57 y.o.female presents for  Past Medical History:  Diagnosis Date   Allergy    Anemia    Anxiety    Arthritis    KNEES,FOOT   ASCUS (atypical squamous cells of undetermined significance) on Pap smear    neg HR HPV 09/2008, ascus 06/2009,10/2009   Cataract    "MILD"   Fatty liver 08/2018   ultrasound   Fatty liver    HSV-2 infection    Hypercholesteremia    Hypertension    OSA on CPAP    Pes planus of both feet 06/20/2013   Prediabetes    Sleep apnea      Allergies  Allergen Reactions   Doxycycline Hives and Shortness Of Breath   Ace Inhibitors     cough   Adhesive [Tape]    Augmentin [Amoxicillin-Pot Clavulanate] Nausea Only    diarrhea   Clindamycin/Lincomycin    Epinephrine     Other reaction(s): Nausea, Headache   Epinephrine-Lidocaine-Na Metabisulfite [Lidocaine-Epinephrine]     Has heart palpitations from the epi, wants without   Levaquin [Levofloxacin] Other (See Comments)    Dysphoria   Vicodin [Hydrocodone-Acetaminophen] Itching    Current Outpatient Medications on File Prior to Visit  Medication Sig   ALPRAZolam (XANAX) 0.5 MG tablet TAKE ONE TABLET BY MOUTH AT BEDTIME AS NEEDED   betamethasone valerate (VALISONE) 0.1 % cream APPLY A THIN LAYER  TO THE AFFECTED AREA(S) BY TOPICAL ROUTE twice DAILY for two weeks   buPROPion (WELLBUTRIN XL) 150 MG 24 hr tablet Take 1 tablet (150 mg total) by mouth every morning.   busPIRone (BUSPAR) 5 MG tablet Take 1 tablet (5 mg total) by mouth 3 (three) times daily.   Dulaglutide (TRULICITY) 4.5 MG/0.5ML SOPN Inject 4.5 mg as directed once a week.   EC-RX Testosterone 0.4 % CREA use pea size amount to inner thigh twice a week   escitalopram (LEXAPRO) 20 MG tablet TAKE 1 TABLET BY MOUTH DAILY   estradiol (ESTRACE) 0.1 MG/GM vaginal cream insert 0.5 g introvaginally every night for one week and then 2-3 times per week   fluticasone (FLONASE) 50 MCG/ACT nasal spray Place into both nostrils daily.   linaclotide (LINZESS) 72 MCG capsule Take 1 capsule (72 mcg total) by mouth daily before breakfast.   Multiple Vitamin (MULTIVITAMIN ADULT PO) Take by mouth. Life Extension One-Per-Day   Phendimetrazine Tartrate 35 MG TABS Take 1 tablet (35 mg total) by mouth daily. 1/2-1 tab every morning for weight loss   promethazine (PHENERGAN) 25 MG tablet TAKE ONE TO TWO TABLETS BY MOUTH EVERY NIGHT AT BEDTIME AS NEEDED FOR SLEEP   rosuvastatin (CRESTOR) 10 MG tablet TAKE 1 TABLET BY MOUTH DAILY FOR CHOLESTEROL   traZODone (DESYREL) 50 MG tablet Take 1 tablet (50 mg total) by mouth at bedtime. prn   tretinoin (RETIN-A) 0.025 % cream Apply topically daily.   valACYclovir (  VALTREX) 500 MG tablet TAKE ONE TO TWO TABLETS BY MOUTH DAILY FOR FEVER BLISTER   valsartan (DIOVAN) 160 MG tablet Take 1 tablet (160 mg total) by mouth daily.   No current facility-administered medications on file prior to visit.    ROS: all negative except above.   Physical Exam:  There were no vitals taken for this visit.  General Appearance: Well nourished, in no apparent distress. Eyes: PERRLA, EOMs, conjunctiva no swelling or erythema Sinuses: No Frontal/maxillary tenderness ENT/Mouth: Ext aud canals clear, TMs without erythema, bulging. No  erythema, swelling, or exudate on post pharynx.  Tonsils not swollen or erythematous. Hearing normal.  Neck: Supple, thyroid normal.  Respiratory: Respiratory effort normal, BS equal bilaterally without rales, rhonchi, wheezing or stridor.  Cardio: RRR with no MRGs. Brisk peripheral pulses without edema.  Abdomen: Soft, + BS.  Non tender, no guarding, rebound, hernias, masses. Lymphatics: Non tender without lymphadenopathy.  Musculoskeletal: Full ROM, 5/5 strength, normal gait.  Skin: Warm, dry without rashes, lesions, ecchymosis.  Neuro: Cranial nerves intact. Normal muscle tone, no cerebellar symptoms. Sensation intact.  Psych: Awake and oriented X 3, normal affect, Insight and Judgment appropriate.     Raynelle Dick, NP 2:16 PM [facility]

## 2022-10-20 ENCOUNTER — Encounter: Payer: Self-pay | Admitting: Nurse Practitioner

## 2022-10-20 ENCOUNTER — Ambulatory Visit: Payer: 59 | Admitting: Nurse Practitioner

## 2022-10-20 VITALS — BP 104/72 | HR 65 | Temp 97.7°F | Ht 65.0 in | Wt 201.0 lb

## 2022-10-20 DIAGNOSIS — F322 Major depressive disorder, single episode, severe without psychotic features: Secondary | ICD-10-CM | POA: Diagnosis not present

## 2022-10-20 DIAGNOSIS — E782 Mixed hyperlipidemia: Secondary | ICD-10-CM

## 2022-10-20 DIAGNOSIS — E6609 Other obesity due to excess calories: Secondary | ICD-10-CM

## 2022-10-20 DIAGNOSIS — I10 Essential (primary) hypertension: Secondary | ICD-10-CM

## 2022-10-20 DIAGNOSIS — M778 Other enthesopathies, not elsewhere classified: Secondary | ICD-10-CM | POA: Diagnosis not present

## 2022-10-20 DIAGNOSIS — Z79899 Other long term (current) drug therapy: Secondary | ICD-10-CM

## 2022-10-20 DIAGNOSIS — F411 Generalized anxiety disorder: Secondary | ICD-10-CM | POA: Diagnosis not present

## 2022-10-20 DIAGNOSIS — Z6839 Body mass index (BMI) 39.0-39.9, adult: Secondary | ICD-10-CM

## 2022-10-20 DIAGNOSIS — E559 Vitamin D deficiency, unspecified: Secondary | ICD-10-CM

## 2022-10-20 DIAGNOSIS — R7309 Other abnormal glucose: Secondary | ICD-10-CM | POA: Diagnosis not present

## 2022-10-20 MED ORDER — PHENDIMETRAZINE TARTRATE 35 MG PO TABS
35.0000 mg | ORAL_TABLET | Freq: Two times a day (BID) | ORAL | 0 refills | Status: DC
Start: 2022-10-20 — End: 2022-12-10

## 2022-10-20 NOTE — Patient Instructions (Signed)

## 2022-10-21 LAB — COMPLETE METABOLIC PANEL WITH GFR
AG Ratio: 1.8 (calc) (ref 1.0–2.5)
ALT: 29 U/L (ref 6–29)
AST: 20 U/L (ref 10–35)
Albumin: 4.6 g/dL (ref 3.6–5.1)
Alkaline phosphatase (APISO): 65 U/L (ref 37–153)
BUN: 14 mg/dL (ref 7–25)
CO2: 28 mmol/L (ref 20–32)
Calcium: 9.8 mg/dL (ref 8.6–10.4)
Chloride: 105 mmol/L (ref 98–110)
Creat: 0.83 mg/dL (ref 0.50–1.03)
Globulin: 2.5 g/dL (ref 1.9–3.7)
Glucose, Bld: 90 mg/dL (ref 65–99)
Potassium: 4.4 mmol/L (ref 3.5–5.3)
Sodium: 142 mmol/L (ref 135–146)
Total Bilirubin: 0.4 mg/dL (ref 0.2–1.2)
Total Protein: 7.1 g/dL (ref 6.1–8.1)
eGFR: 83 mL/min/{1.73_m2} (ref >=60)

## 2022-10-21 LAB — CBC WITH DIFFERENTIAL/PLATELET
Absolute Monocytes: 358 {cells}/uL (ref 200–950)
Basophils Absolute: 50 cells/uL (ref 0–200)
Basophils Relative: 0.9 %
Eosinophils Absolute: 101 {cells}/uL (ref 15–500)
Eosinophils Relative: 1.8 %
HCT: 38.5 % (ref 35.0–45.0)
Hemoglobin: 12.6 g/dL (ref 11.7–15.5)
Lymphs Abs: 1691 {cells}/uL (ref 850–3900)
MCH: 29.3 pg (ref 27.0–33.0)
MCHC: 32.7 g/dL (ref 32.0–36.0)
MCV: 89.5 fL (ref 80.0–100.0)
MPV: 11.5 fL (ref 7.5–12.5)
Monocytes Relative: 6.4 %
Neutro Abs: 3399 {cells}/uL (ref 1500–7800)
Neutrophils Relative %: 60.7 %
Platelets: 217 10*3/uL (ref 140–400)
RBC: 4.3 10*6/uL (ref 3.80–5.10)
RDW: 12.6 % (ref 11.0–15.0)
Total Lymphocyte: 30.2 %
WBC: 5.6 10*3/uL (ref 3.8–10.8)

## 2022-10-21 LAB — LIPID PANEL
Cholesterol: 181 mg/dL (ref <200)
HDL: 61 mg/dL (ref >=50)
LDL Cholesterol (Calc): 97 mg/dL
Non-HDL Cholesterol (Calc): 120 mg/dL (ref <130)
Total CHOL/HDL Ratio: 3 (calc) (ref <5.0)
Triglycerides: 136 mg/dL (ref <150)

## 2022-10-21 LAB — TSH: TSH: 1.49 m[IU]/L (ref 0.40–4.50)

## 2022-10-21 LAB — MAGNESIUM: Magnesium: 1.9 mg/dL (ref 1.5–2.5)

## 2022-11-10 ENCOUNTER — Encounter: Payer: Self-pay | Admitting: Nurse Practitioner

## 2022-12-03 ENCOUNTER — Encounter: Payer: Self-pay | Admitting: Nurse Practitioner

## 2022-12-03 ENCOUNTER — Other Ambulatory Visit: Payer: Self-pay

## 2022-12-03 DIAGNOSIS — E6609 Other obesity due to excess calories: Secondary | ICD-10-CM

## 2022-12-03 MED ORDER — ZEPBOUND 5 MG/0.5ML ~~LOC~~ SOAJ: 5.0000 mg | SUBCUTANEOUS | 1 refills | Status: DC

## 2022-12-03 NOTE — Telephone Encounter (Signed)
Please call patient and ask which dose she wants to use. I believe she would need to be Zepbound not Mounjaro as she is not diabetic

## 2022-12-07 ENCOUNTER — Telehealth: Payer: Self-pay

## 2022-12-07 NOTE — Telephone Encounter (Signed)
Prior auth completed and submitted.

## 2022-12-10 ENCOUNTER — Other Ambulatory Visit: Payer: Self-pay | Admitting: Nurse Practitioner

## 2022-12-10 DIAGNOSIS — E6609 Other obesity due to excess calories: Secondary | ICD-10-CM

## 2022-12-10 DIAGNOSIS — F411 Generalized anxiety disorder: Secondary | ICD-10-CM

## 2022-12-10 NOTE — Telephone Encounter (Signed)
Prior auth denied. 

## 2022-12-23 ENCOUNTER — Other Ambulatory Visit: Payer: Self-pay | Admitting: Nurse Practitioner

## 2022-12-23 DIAGNOSIS — F331 Major depressive disorder, recurrent, moderate: Secondary | ICD-10-CM

## 2022-12-23 DIAGNOSIS — F411 Generalized anxiety disorder: Secondary | ICD-10-CM

## 2022-12-29 ENCOUNTER — Ambulatory Visit: Payer: 59 | Admitting: Nurse Practitioner

## 2023-01-03 ENCOUNTER — Other Ambulatory Visit: Payer: Self-pay | Admitting: Nurse Practitioner

## 2023-01-03 DIAGNOSIS — F411 Generalized anxiety disorder: Secondary | ICD-10-CM

## 2023-01-04 ENCOUNTER — Telehealth: Payer: Self-pay | Admitting: Nurse Practitioner

## 2023-01-04 NOTE — Telephone Encounter (Signed)
She has requested too soon, cannot be filled until the 17th of the month

## 2023-01-04 NOTE — Telephone Encounter (Signed)
Refill request on Xanax. Pls send to  [facility] [phone] - [place], [place] - [address]

## 2023-01-19 ENCOUNTER — Ambulatory Visit: Payer: 59 | Admitting: Nurse Practitioner

## 2023-01-20 NOTE — Progress Notes (Signed)
Assessment and Plan: WILL COME BACK TOMORROW FOR LABS Wendy Levine was seen today for depression.  Diagnoses and all orders for this visit:  Essential Hypertension - continue medications- Valsartan 160 mg every day - Continue DASH diet, exercise and monitor at home. Call if greater than 130/80.  - CBC, CMP  Anxiety state/ Current severe episode of major depressive disorder without psychotic features, unspecified whether recurrent (HCC) Continue lexapro and Buspar daily , symptoms are controlled currently Instructed patient to contact office or on-call physician promptly should condition worsen or any new symptoms appear. IF THE PATIENT HAS ANY SUICIDAL OR HOMICIDAL IDEATIONS, CALL THE OFFICE, DISCUSS WITH A SUPPORT MEMBER, OR GO TO THE ER IMMEDIATELY. Patient was agreeable with this plan.    Class I Obesity due to excess calories with comorbid conditions of Hyperlipidemia, hypertension and abnormal glucose Fair life protein shakes Eat more frequently - try not to go more than 6 hours without protein Aim for 90 grams of protein a day- 30 breakfast/30 lunch 30 dinner Try to keep net carbs less than 50 Net Carbs=Total Carbs-fiber- sugar alcohols Exercise heartrate 120-140(fat burning zone)- walking 20-30 minutes 4 days a week  Continue Phendimetrazine 35 mg 1 tab PO BID Continue Trulicity 4.5 mg SQ QW - TSH   Abnormal glucose Continue diet and exercise Continue Trulicity 4.5 mg SQ QW -CMP  OSA (obstructive sleep apnea) Will discuss referral for new sleep study at next appointment  Vitamin D Deficiency Continue Vit D supplementation to maintain value in therapeutic level of 60-100   Hyperlipidemia Continue Rosuvastatin 10 mg every day, diet and exercise - lipid panel   Medication management -     CBC with Differential/Platelet -     COMPLETE METABOLIC PANEL WITH GFR -     Lipid panel -     Magnesium -     TSH -     TRULICITY 4.5 MG/0.5ML SOAJ; Inject 4.5 mg into the skin once  a week.  Needs flu shot -     Fluzone Trivalent Flu Vaccine (Muli dose preparattion)  Viral wart on finger Liquid nitrogen was applied for 10-12 seconds to the skin lesions and the expected blistering or scabbing reaction explained. Do not pick at the areas. Patient reminded to expect hypopigmented scars from the procedure. Return if lesions fail to fully resolve.     Further disposition pending results of labs. Discussed med's effects and SE's.   Over 30 minutes of exam, counseling, chart review, and critical decision making was performed. 5  Future Appointments  Date Time Provider Department Center  02/15/2023 11:20 AM Croitoru, Rachelle Hora, MD [facility] None  06/23/2023 10:00 AM Raynelle Dick, NP [facility] None    ------------------------------------------------------------------------------------------------------------------   HPI BP 118/82   Pulse 75   Temp (!) 97.3 F (36.3 C)   Ht 5\' 5"  (1.651 m)   Wt 209 lb 12.8 oz (95.2 kg)   SpO2 98%   BMI 34.91 kg/m   57 y.o.female presents for reevaluation of depression/anxiety.  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 Was evaluated at [facility]. NSAIDS and Tylenol are not helping.   BP is currently controlled on Valsartan 160 mg every day. Denies headaches, chest pain, shortness of breath and dizziness  BP Readings from Last 3 Encounters:  01/25/23 118/82  10/20/22 104/72  09/21/22 108/72  BMI is Body mass index is 34.91 kg/m., she has been working on diet and exercise. Phendimetrazine 35 mg QD.  Also using Trulicity 4.5 mg without side effects.  Wt Readings from Last 3 Encounters:  01/25/23 209 lb 12.8 oz (95.2 kg)  10/20/22 201 lb (91.2 kg)  09/21/22 205 lb (93 kg)   She has not been using her CPAP for several months due to equipment not fitting. She has not been  snoring.  Denies daytime somnolence.    She is working on diet and exercise to control abnormal glucose Lab Results  Component Value Date   HGBA1C 5.4 06/23/2022   Cholesterol is currently controlled with Rosuvastatin 10 mg QD Lab Results  Component Value Date   CHOL 181 10/20/2022   HDL 61 10/20/2022   LDLCALC 97 10/20/2022   TRIG 136 10/20/2022   CHOLHDL 3.0 10/20/2022    Vitamin D deficiency is currently controlled with supplementation Lab Results  Component Value Date   VD25OH 76 06/23/2022        Past Medical History:  Diagnosis Date   Allergy    Anemia    Anxiety    Arthritis    KNEES,FOOT   ASCUS (atypical squamous cells of undetermined significance) on Pap smear    neg HR HPV 09/2008, ascus 06/2009,10/2009   Cataract    "MILD"   Fatty liver 08/2018   ultrasound   Fatty liver    HSV-2 infection    Hypercholesteremia    Hypertension    OSA on CPAP    Pes planus of both feet 06/20/2013   Prediabetes    Sleep apnea      Allergies  Allergen Reactions   Doxycycline Hives and Shortness Of Breath   Ace Inhibitors     cough   Adhesive [Tape]    Augmentin [Amoxicillin-Pot Clavulanate] Nausea Only    diarrhea   Clindamycin/Lincomycin    Epinephrine     Other reaction(s): Nausea, Headache   Epinephrine-Lidocaine-Na Metabisulfite [Lidocaine-Epinephrine (Pf)]     Has heart palpitations from the epi, wants without   Levaquin [Levofloxacin] Other (See Comments)    Dysphoria   Vicodin [Hydrocodone-Acetaminophen] Itching    Current Outpatient Medications on File Prior to Visit  Medication Sig   ALPRAZolam (XANAX) 0.5 MG tablet TAKE ONE TABLET BY MOUTH AT BEDTIME AS NEEDED   escitalopram (LEXAPRO) 20 MG tablet TAKE 1 TABLET BY MOUTH DAILY   fluticasone (FLONASE) 50 MCG/ACT nasal spray Place into both nostrils daily.   linaclotide (LINZESS) 72 MCG capsule Take 1 capsule (72 mcg total) by mouth daily before breakfast.   Multiple Vitamin (MULTIVITAMIN ADULT PO)  Take by mouth. Life Extension One-Per-Day   Phendimetrazine Tartrate 35 MG TABS 1 tab in the am and 1 tab at lunch   promethazine (PHENERGAN) 25 MG tablet TAKE ONE TO TWO TABLETS BY MOUTH EVERY NIGHT AT BEDTIME AS NEEDED FOR SLEEP   rosuvastatin (CRESTOR) 10 MG tablet TAKE 1 TABLET BY MOUTH DAILY FOR CHOLESTEROL   TRULICITY 4.5 MG/0.5ML SOAJ Inject into the skin.   valACYclovir (VALTREX) 500 MG tablet TAKE ONE TO TWO TABLETS BY MOUTH DAILY FOR FEVER BLISTER   valsartan (DIOVAN) 160 MG tablet Take 1 tablet (160 mg total) by mouth daily.   buPROPion (WELLBUTRIN XL) 150 MG 24 hr tablet Take 1 tablet (150 mg total) by mouth every morning. (Patient not taking: Reported on 01/25/2023)   EC-RX Testosterone 0.4 % CREA use pea size amount to inner thigh twice a week (  Patient not taking: Reported on 01/25/2023)   estradiol (ESTRACE) 0.1 MG/GM vaginal cream insert 0.5 g introvaginally every night for one week and then 2-3 times per week (Patient not taking: Reported on 01/25/2023)   No current facility-administered medications on file prior to visit.    ROS: all negative except above.   Physical Exam:  BP 118/82   Pulse 75   Temp (!) 97.3 F (36.3 C)   Ht 5\' 5"  (1.651 m)   Wt 209 lb 12.8 oz (95.2 kg)   SpO2 98%   BMI 34.91 kg/m   General Appearance: Pleasant obese female in no apparent distress Eyes: PERRLA, EOMs, conjunctiva no swelling or erythema Sinuses: No Frontal/maxillary tenderness Neck: Supple, thyroid normal.  Respiratory: Respiratory effort normal, BS equal bilaterally without rales, rhonchi, wheezing or stridor.  Cardio: RRR with no MRGs. Brisk peripheral pulses without edema.  Abdomen: Soft, + BS.  Non tender, no guarding, rebound, hernias, masses. Lymphatics: Non tender without lymphadenopathy.  Musculoskeletal: Full ROM except right shoulder, 5/5 strength, normal gait. Right shoulder has palpable inflammation of  tendon anterior shoulder Skin: Warm, dry . Small wart on top of  right middle finger Neuro: Cranial nerves intact. Normal muscle tone, no cerebellar symptoms. Sensation intact.  Psych: Awake and oriented X 3, depressed affect, Insight and Judgment appropriate.    Liquid nitrogen was applied for 10-12 seconds to the skin lesions and the expected blistering or scabbing reaction explained. Do not pick at the areas. Patient reminded to expect hypopigmented scars from the procedure. Return if lesions fail to fully resolve.   Raynelle Dick, NP 11:47 AM [facility]

## 2023-01-25 ENCOUNTER — Encounter: Payer: Self-pay | Admitting: Nurse Practitioner

## 2023-01-25 ENCOUNTER — Ambulatory Visit: Payer: 59 | Admitting: Nurse Practitioner

## 2023-01-25 VITALS — BP 118/82 | HR 75 | Temp 97.3°F | Ht 65.0 in | Wt 209.8 lb

## 2023-01-25 DIAGNOSIS — F411 Generalized anxiety disorder: Secondary | ICD-10-CM

## 2023-01-25 DIAGNOSIS — E559 Vitamin D deficiency, unspecified: Secondary | ICD-10-CM

## 2023-01-25 DIAGNOSIS — Z6834 Body mass index (BMI) 34.0-34.9, adult: Secondary | ICD-10-CM

## 2023-01-25 DIAGNOSIS — Z23 Encounter for immunization: Secondary | ICD-10-CM | POA: Diagnosis not present

## 2023-01-25 DIAGNOSIS — E66811 Other obesity due to excess calories: Secondary | ICD-10-CM

## 2023-01-25 DIAGNOSIS — E66812 Other obesity due to excess calories: Secondary | ICD-10-CM

## 2023-01-25 DIAGNOSIS — I10 Essential (primary) hypertension: Secondary | ICD-10-CM

## 2023-01-25 DIAGNOSIS — E782 Mixed hyperlipidemia: Secondary | ICD-10-CM

## 2023-01-25 DIAGNOSIS — B079 Viral wart, unspecified: Secondary | ICD-10-CM | POA: Diagnosis not present

## 2023-01-25 DIAGNOSIS — Z79899 Other long term (current) drug therapy: Secondary | ICD-10-CM

## 2023-01-25 DIAGNOSIS — R7309 Other abnormal glucose: Secondary | ICD-10-CM

## 2023-01-25 DIAGNOSIS — F322 Major depressive disorder, single episode, severe without psychotic features: Secondary | ICD-10-CM

## 2023-01-25 DIAGNOSIS — Z6839 Body mass index (BMI) 39.0-39.9, adult: Secondary | ICD-10-CM

## 2023-01-25 DIAGNOSIS — G4733 Obstructive sleep apnea (adult) (pediatric): Secondary | ICD-10-CM

## 2023-01-25 DIAGNOSIS — E6609 Other obesity due to excess calories: Secondary | ICD-10-CM

## 2023-01-25 MED ORDER — TRULICITY 4.5 MG/0.5ML ~~LOC~~ SOAJ
4.5000 mg | SUBCUTANEOUS | 3 refills | Status: AC
Start: 2023-01-25 — End: ?

## 2023-01-25 MED ORDER — BUSPIRONE HCL 5 MG PO TABS
5.0000 mg | ORAL_TABLET | Freq: Three times a day (TID) | ORAL | 2 refills | Status: AC
Start: 2023-01-25 — End: ?

## 2023-01-25 NOTE — Patient Instructions (Signed)
Warts  Warts are small growths on the skin. They are common, and they are caused by a virus. Warts can be found on many parts of the body. A person may have one wart or many warts. Most warts will go away on their own with time, but this could take many months to a few years. If needed, warts can be treated. What are the causes? A type of virus called HPV causes warts. HPV can spread from person to person through touching. Warts can also spread to other parts of the body when a person scratches a wart and then scratches another part of his or her body. What increases the risk? You are more likely to get warts if: You are 10-20 years old. You have a weak body defense system (immune system). You are Caucasian. What are the signs or symptoms? The main symptom of this condition is small growths on the skin. Warts may: Have different shapes. They may be round, oval, or uneven. Feel rough to the touch. Be the color of your skin or light yellow, brown, or gray. Often be less than  inch (1.3 cm) in size. Go away and then come back again. Most warts do not hurt, but some can hurt if they are large or if they are on the bottom of your feet. How is this diagnosed? A wart can often be diagnosed by how it looks. In some cases, the doctor might remove a little bit of the wart to test it (biopsy). How is this treated? Most of the time, warts do not need treatment. Sometimes people want warts removed. If treatment is needed or wanted, it may include: Putting creams or patches with medicine in them on the wart. Putting duct tape over the top of the wart. Freezing the wart. Burning the wart with: A laser. An electric probe. Giving a shot of medicine into the wart to help the body's defense system fight off the wart. Surgery to remove the wart. Follow these instructions at home:  Medicines Use over-the-counter and prescription medicines only as told by your doctor. Do not use over-the-counter wart  medicines on your face or genitals before asking your doctor. Lifestyle Keep your body's defense system healthy. To do this: Eat a healthy diet. Get enough sleep. Do not smoke or use any products that contain nicotine or tobacco. If you need help quitting, ask your doctor. General instructions Wash your hands after you touch a wart. Do not scratch or pick at a wart. Avoid shaving hair that is over a wart. Keep all follow-up visits. Contact a doctor if: Your warts do not get better after treatment. You have redness, swelling, or pain at the site of a wart. Your wart is bleeding, and the bleeding does not stop when you lightly press on the wart. You have diabetes and you get a wart. Summary Warts are small growths on the skin. They are common and are caused by a virus. Most of the time, warts do not need treatment. Sometimes people want warts removed. If treatment is needed or wanted, there are many options. Apply over-the-counter and prescription medicines only as told by your doctor. Wash your hands after you touch a wart. Keep all follow-up visits. This information is not intended to replace advice given to you by your health care provider. Make sure you discuss any questions you have with your health care provider. Document Revised: 03/14/2021 Document Reviewed: 03/14/2021 Elsevier Patient Education  2024 [employer]  

## 2023-01-27 ENCOUNTER — Other Ambulatory Visit: Payer: 59

## 2023-01-27 DIAGNOSIS — E66812 Obesity, class 2: Secondary | ICD-10-CM | POA: Diagnosis not present

## 2023-01-27 DIAGNOSIS — Z79899 Other long term (current) drug therapy: Secondary | ICD-10-CM | POA: Diagnosis not present

## 2023-01-27 DIAGNOSIS — E782 Mixed hyperlipidemia: Secondary | ICD-10-CM | POA: Diagnosis not present

## 2023-01-27 DIAGNOSIS — I10 Essential (primary) hypertension: Secondary | ICD-10-CM | POA: Diagnosis not present

## 2023-01-27 DIAGNOSIS — R7309 Other abnormal glucose: Secondary | ICD-10-CM | POA: Diagnosis not present

## 2023-01-27 DIAGNOSIS — E6609 Other obesity due to excess calories: Secondary | ICD-10-CM | POA: Diagnosis not present

## 2023-01-27 DIAGNOSIS — Z6839 Body mass index (BMI) 39.0-39.9, adult: Secondary | ICD-10-CM | POA: Diagnosis not present

## 2023-01-28 LAB — TSH: TSH: 2 m[IU]/L (ref 0.40–4.50)

## 2023-01-28 LAB — COMPLETE METABOLIC PANEL WITH GFR
AG Ratio: 1.7 (calc) (ref 1.0–2.5)
ALT: 18 U/L (ref 6–29)
AST: 16 U/L (ref 10–35)
Albumin: 4.4 g/dL (ref 3.6–5.1)
Alkaline phosphatase (APISO): 63 U/L (ref 37–153)
BUN: 11 mg/dL (ref 7–25)
CO2: 27 mmol/L (ref 20–32)
Calcium: 9.5 mg/dL (ref 8.6–10.4)
Chloride: 106 mmol/L (ref 98–110)
Creat: 0.76 mg/dL (ref 0.50–1.03)
Globulin: 2.6 g/dL (ref 1.9–3.7)
Glucose, Bld: 89 mg/dL (ref 65–99)
Potassium: 4.5 mmol/L (ref 3.5–5.3)
Sodium: 141 mmol/L (ref 135–146)
Total Bilirubin: 0.5 mg/dL (ref 0.2–1.2)
Total Protein: 7 g/dL (ref 6.1–8.1)
eGFR: 92 mL/min/{1.73_m2} (ref >=60)

## 2023-01-28 LAB — LIPID PANEL
Cholesterol: 183 mg/dL (ref <200)
HDL: 71 mg/dL (ref >=50)
LDL Cholesterol (Calc): 91 mg/dL
Non-HDL Cholesterol (Calc): 112 mg/dL (ref <130)
Total CHOL/HDL Ratio: 2.6 (calc) (ref <5.0)
Triglycerides: 112 mg/dL (ref <150)

## 2023-01-28 LAB — CBC WITH DIFFERENTIAL/PLATELET
Absolute Lymphocytes: 1336 {cells}/uL (ref 850–3900)
Absolute Monocytes: 398 {cells}/uL (ref 200–950)
Basophils Absolute: 51 {cells}/uL (ref 0–200)
Basophils Relative: 1 %
Eosinophils Absolute: 92 {cells}/uL (ref 15–500)
Eosinophils Relative: 1.8 %
HCT: 41.3 % (ref 35.0–45.0)
Hemoglobin: 13.2 g/dL (ref 11.7–15.5)
MCH: 29.1 pg (ref 27.0–33.0)
MCHC: 32 g/dL (ref 32.0–36.0)
MCV: 91 fL (ref 80.0–100.0)
MPV: 11.5 fL (ref 7.5–12.5)
Monocytes Relative: 7.8 %
Neutro Abs: 3223 {cells}/uL (ref 1500–7800)
Neutrophils Relative %: 63.2 %
Platelets: 222 10*3/uL (ref 140–400)
RBC: 4.54 10*6/uL (ref 3.80–5.10)
RDW: 12.5 % (ref 11.0–15.0)
Total Lymphocyte: 26.2 %
WBC: 5.1 10*3/uL (ref 3.8–10.8)

## 2023-01-28 LAB — MAGNESIUM: Magnesium: 2 mg/dL (ref 1.5–2.5)

## 2023-02-08 ENCOUNTER — Other Ambulatory Visit: Payer: Self-pay | Admitting: Nurse Practitioner

## 2023-02-08 DIAGNOSIS — E66812 Obesity, class 2: Secondary | ICD-10-CM

## 2023-02-08 DIAGNOSIS — F411 Generalized anxiety disorder: Secondary | ICD-10-CM

## 2023-02-15 ENCOUNTER — Ambulatory Visit: Payer: 59 | Admitting: Cardiovascular Disease

## 2023-03-10 ENCOUNTER — Other Ambulatory Visit: Payer: Self-pay | Admitting: Nurse Practitioner

## 2023-03-10 DIAGNOSIS — E66812 Obesity, class 2: Secondary | ICD-10-CM

## 2023-03-10 DIAGNOSIS — F411 Generalized anxiety disorder: Secondary | ICD-10-CM

## 2023-03-28 NOTE — Progress Notes (Unsigned)
Cardiology Office Note:    Date:  03/29/2023   ID:  Wendy Levine, DOB 07/24/1965, MRN [id number]  PCP:  Lucky Cowboy, MD   [facility] Providers Cardiologist:  Thurmon Fair, MD     Referring MD: Lucky Cowboy, MD   No chief complaint on file. Wendy Levine is a 58 y.o. female who is being seen today for the evaluation of edema at the request of Lucky Cowboy, MD.   History of Present Illness:    Wendy Levine is a 58 y.o. female with a hx of OSA, obesity, HTN, hypercholesterolemia, was been struggling with worsening edema of the lower extremities for may be a year or year and a half.  The leg swelling has become particularly severe in the last month or so.  It is not associate with shortness of breath, orthopnea, PND.  It is always worse at the end of the day, but does not always go away completely after lying in bed overnight.  In the past her hypertension was treated with hydrochlorothiazide but this was stopped since it was causing some issues with bone metabolism.  She started taking amlodipine 5 mg daily about 3 months ago (the edema was already an issue at that time).  She is also taken phentermine off and on over the last year but is currently weaning off it.  She has tried Ozempic for weight loss but this has not really worked, which are worked much better for weight loss but she has been unable to continue taking this and is now on Trulicity.  Has some symptoms of daytime hypersomnolence and is scheduled for sleep study.  She has a few other complaints related to tingling and numbness in her fingertips.  The description is quite suggestive of carpal tunnel syndrome.  She has had an area of soft tissue swelling in the left supraclavicular area that has not been explained by ultrasound evaluation.  Has a history of low back pain/lumbar spine issues, currently not active.  She does not have a history of congestive heart failure, serious valve  abnormalities, arrhythmia or coronary or peripheral vascular disease.  She had a normal echocardiogram in 2017.  She had duplex ultrasound performed for right leg swelling in 2015 that did not show an thrombus or evidence of venous reflux.  Abdominal ultrasound in 2020 shows normal kidney sizes without evidence of hydronephrosis.  Urine studies did not show proteinuria.  The ultrasound does show evidence of hepatic steatosis, but liver function tests are all normal.  Normal PFTs in 2017.  Past Medical History:  Diagnosis Date   Allergy    Anemia    Anxiety    Arthritis    KNEES,FOOT   ASCUS (atypical squamous cells of undetermined significance) on Pap smear    neg HR HPV 09/2008, ascus 06/2009,10/2009   Cataract    "MILD"   Fatty liver 08/2018   ultrasound   Fatty liver    HSV-2 infection    Hypercholesteremia    Hypertension    OSA on CPAP    Pes planus of both feet 06/20/2013   Prediabetes    Sleep apnea     Past Surgical History:  Procedure Laterality Date   CESAREAN SECTION     COLONOSCOPY  2005?   in [place] -"normal"   COLPOSCOPY     KNEE SURGERY     Arthroscopic X2   MOUTH SURGERY     WISDOM TEETH   Nexplanon insertion  05/22/2015   POLYPECTOMY  remove moles      Current Medications: Current Meds  Medication Sig   ALPRAZolam (XANAX) 0.5 MG tablet TAKE ONE TABLET BY MOUTH AT BEDTIME AS NEEDED     Allergies:   Doxycycline, Ace inhibitors, Adhesive [tape], Augmentin [amoxicillin-pot clavulanate], Clindamycin/lincomycin, Epinephrine, Epinephrine-lidocaine-na metabisulfite [lidocaine-epinephrine (pf)], Levaquin [levofloxacin], and Vicodin [hydrocodone-acetaminophen]   Social History   Socioeconomic History   Marital status: Divorced    Spouse name: Not on file   Number of children: 1   Years of education: Not on file   Highest education level: Not on file  Occupational History   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  Tobacco Use   Smoking status: Former     Current packs/day: 0.00    Average packs/day: 0.3 packs/day for 10.0 years (3.0 ttl pk-yrs)    Types: Cigarettes    Start date: 06/07/1989    Quit date: 06/08/1999    Years since quitting: 23.8    Passive exposure: Past   Smokeless tobacco: Never  Vaping Use   Vaping status: Never Used  Substance and Sexual Activity   Alcohol use: Yes    Alcohol/week: 11.0 - 15.0 standard drinks of alcohol    Types: 4 - 8 Glasses of wine, 7 Standard drinks or equivalent per week    Comment: 4 X A WEEK   Drug use: No   Sexual activity: Not Currently    Partners: Male    Comment: Nexplanon 05/22/2015 1st intercourse 16 yo-5 partners  Other Topics Concern   Not on file  Social History Narrative   Originally from [place]. Has lived in [place] as well. Previously has traveled to most of the 45 states as well as [place]. Has prior travel to [place] & the [place]. Previously has worked in Research officer, political party and also with veterinarians. Has dogs and cats at home. No bird exposure. No mold exposure. Does have a hot tub/pool combination.    Social Drivers of Corporate investment banker Strain: Not on file  Food Insecurity: Not on file  Transportation Needs: Not on file  Physical Activity: Not on file  Stress: Not on file  Social Connections: Not on file     Family History: The patient's family history includes Breast cancer (age of onset: 38) in her maternal grandmother; Cancer in her mother; Colitis in her mother; Colon polyps in her mother; Emphysema in her maternal grandmother; Hyperlipidemia in her mother; Hypertension in her maternal grandmother and mother; Other in her father. There is no history of Colon cancer, Crohn's disease, Esophageal cancer, Rectal cancer, or Stomach cancer.  ROS:   Please see the history of present illness.     All other systems reviewed and are negative.  EKGs/Labs/Other Studies Reviewed:    The following studies were reviewed today:  Venous duplex ultrasound of right lower extremity  2015 PFTs 2017 Abdominal ultrasound 2020 Ultrasound of the soft tissues of head and neck 2022  Echocardiogram 07/23/2021:   1. Left ventricular ejection fraction, by estimation, is 60 to 65%. The  left ventricle has normal function. The left ventricle has no regional  wall motion abnormalities. There is mild left ventricular hypertrophy.  Left ventricular diastolic parameters  were normal.   2. Right ventricular systolic function is normal. The right ventricular  size is normal. Tricuspid regurgitation signal is inadequate for assessing  PA pressure.   3. The mitral valve is normal in structure. No evidence of mitral valve  regurgitation. No evidence of mitral stenosis.   4. The aortic valve  was not well visualized. Aortic valve regurgitation  is not visualized. No aortic stenosis is present.   5. The inferior vena cava is normal in size with greater than 50%  respiratory variability, suggesting right atrial pressure of 3 mmHg.   EKG:  EKG 06/23/2022 demonstrates normal sinus rhythm  EKG Interpretation Date/Time:  Monday March 29 2023 10:40:33 EST Ventricular Rate:  57 PR Interval:  176 QRS Duration:  86 QT Interval:  442 QTC Calculation: 430 R Axis:   26  Text Interpretation: Sinus bradycardia No previous ECGs available Confirmed by Gaige Sebo ([id number]) on 03/29/2023 10:44:18 AM         Recent Labs: 01/27/2023: ALT 18; BUN 11; Creat 0.76; Hemoglobin 13.2; Magnesium 2.0; Platelets 222; Potassium 4.5; Sodium 141; TSH 2.00  Recent Lipid Panel    Component Value Date/Time   CHOL 183 01/27/2023 1026   TRIG 112 01/27/2023 1026   HDL 71 01/27/2023 1026   CHOLHDL 2.6 01/27/2023 1026   VLDL 27 03/23/2016 1019   LDLCALC 91 01/27/2023 1026     Risk Assessment/Calculations:           Physical Exam:    VS:  Ht 5\' 5"  (1.651 m)   Wt 211 lb 3.2 oz (95.8 kg)   SpO2 95%   BMI 35.15 kg/m     Wt Readings from Last 3 Encounters:  03/29/23 211 lb 3.2 oz (95.8 kg)   01/25/23 209 lb 12.8 oz (95.2 kg)  10/20/22 201 lb (91.2 kg)     GEN:  Well nourished, well developed in no acute distress HEENT: Normal NECK: No JVD; No carotid bruits LYMPHATICS: No lymphadenopathy CARDIAC: RRR, no murmurs, rubs, gallops RESPIRATORY:  Clear to auscultation without rales, wheezing or rhonchi  ABDOMEN: Soft, non-tender, non-distended MUSCULOSKELETAL:  No edema; No deformity  SKIN: Warm and dry NEUROLOGIC:  Alert and oriented x 3 PSYCHIATRIC:  Normal affect   ASSESSMENT:    1. Essential hypertension   2. Bilateral leg edema   3. Moderate obesity   4. Hepatic steatosis     PLAN:    In order of problems listed above:  Bilateral leg edema: Amlodipine is clearly contributory, although the edema was present at a lesser degree even before this medication was started.  We will stop the amlodipine and try an alternative agent such as valsartan 160 mg daily.  She is also weaning off phentermine, which might be contributing to edema.  We will repeat an echocardiogram since this has not been evaluated since 2007.  Specifically we will look for signs of right ventricular enlargement or dysfunction (possibly due to obstructive sleep apnea).  She is working hard on weight loss which will also help with improvement of the edema. Moderate obesity: Increases likelihood of sleep apnea. Hepatic steatosis: Without evidence of liver dysfunction by labs.  Normal albumin.  Unlikely to be contributing to the edema. Left supraclavicular swelling: This does appear to be asymmetrical.  I cannot palpate a mass or lymphadenopathy.  We will order a CT. Bilateral hand paresthesias: Suspect this could be carpal tunnel syndrome.  Suggested trying to wear hand splints.           Medication Adjustments/Labs and Tests Ordered: Current medicines are reviewed at length with the patient today.  Concerns regarding medicines are outlined above.  Orders Placed This Encounter  Procedures   EKG  12-Lead   No orders of the defined types were placed in this encounter.   There are no Patient Instructions on  file for this visit.   Signed, Thurmon Fair, MD  03/29/2023 10:44 AM    [facility]

## 2023-03-29 ENCOUNTER — Encounter: Payer: Self-pay | Admitting: Cardiovascular Disease

## 2023-03-29 ENCOUNTER — Ambulatory Visit: Payer: 59 | Attending: Cardiovascular Disease | Admitting: Cardiovascular Disease

## 2023-03-29 VITALS — BP 108/74 | HR 57 | Ht 65.0 in | Wt 211.2 lb

## 2023-03-29 DIAGNOSIS — R600 Localized edema: Secondary | ICD-10-CM | POA: Diagnosis not present

## 2023-03-29 DIAGNOSIS — K760 Fatty (change of) liver, not elsewhere classified: Secondary | ICD-10-CM | POA: Diagnosis not present

## 2023-03-29 DIAGNOSIS — E669 Obesity, unspecified: Secondary | ICD-10-CM | POA: Diagnosis not present

## 2023-03-29 DIAGNOSIS — I10 Essential (primary) hypertension: Secondary | ICD-10-CM

## 2023-03-29 MED ORDER — VALSARTAN 80 MG PO TABS: 80.0000 mg | ORAL_TABLET | Freq: Every day | ORAL | 3 refills | Status: AC

## 2023-03-29 NOTE — Patient Instructions (Signed)
,  Medication Instructions:   Stop taking Valsartan 160 mg   Start taking 80 mg   Valsartan  daily   *If you need a refill on your cardiac medications before your next appointment, please call your pharmacy*   Lab Work: Not  needed   Testing/Procedures: Not needed   Follow-Up: At [facility], you and your health needs are our priority.  As part of our continuing mission to provide you with exceptional heart care, we have created designated Provider Care Teams.  These Care Teams include your primary Cardiologist (physician) and Advanced Practice Providers (APPs -  Physician Assistants and Nurse Practitioners) who all work together to provide you with the care you need, when you need it.     Your next appointment:   6 month(s)  The format for your next appointment:   In Person  Provider:   Thurmon Fair, MD

## 2023-04-02 ENCOUNTER — Encounter: Payer: Self-pay | Admitting: Cardiovascular Disease

## 2023-04-02 DIAGNOSIS — F331 Major depressive disorder, recurrent, moderate: Secondary | ICD-10-CM

## 2023-04-02 DIAGNOSIS — F411 Generalized anxiety disorder: Secondary | ICD-10-CM

## 2023-04-02 MED ORDER — ESCITALOPRAM OXALATE 20 MG PO TABS: 20.0000 mg | ORAL_TABLET | Freq: Every day | ORAL | 0 refills | Status: AC

## 2023-04-02 NOTE — Telephone Encounter (Signed)
 Please refill the lexapro  20 mg daily for 30 days, no RF

## 2023-04-07 DIAGNOSIS — Z1389 Encounter for screening for other disorder: Secondary | ICD-10-CM | POA: Diagnosis not present

## 2023-04-07 DIAGNOSIS — Z130 Encounter for screening for diseases of the blood and blood-forming organs and certain disorders involving the immune mechanism: Secondary | ICD-10-CM | POA: Diagnosis not present

## 2023-04-07 DIAGNOSIS — Z1231 Encounter for screening mammogram for malignant neoplasm of breast: Secondary | ICD-10-CM | POA: Diagnosis not present

## 2023-04-07 DIAGNOSIS — Z01419 Encounter for gynecological examination (general) (routine) without abnormal findings: Secondary | ICD-10-CM | POA: Diagnosis not present

## 2023-04-10 ENCOUNTER — Encounter: Payer: Self-pay | Admitting: Cardiovascular Disease

## 2023-04-23 ENCOUNTER — Emergency Department (HOSPITAL_COMMUNITY): Payer: 59

## 2023-04-23 ENCOUNTER — Other Ambulatory Visit: Payer: Self-pay

## 2023-04-23 ENCOUNTER — Encounter (HOSPITAL_COMMUNITY): Payer: Self-pay

## 2023-04-23 ENCOUNTER — Emergency Department (HOSPITAL_COMMUNITY)
Admission: EM | Admit: 2023-04-23 | Discharge: 2023-04-23 | Disposition: A | Discharge status: Home / Self Care | Payer: 59 | Attending: Emergency Medicine | Admitting: Emergency Medicine

## 2023-04-23 DIAGNOSIS — R0789 Other chest pain: Secondary | ICD-10-CM | POA: Insufficient documentation

## 2023-04-23 DIAGNOSIS — I10 Essential (primary) hypertension: Secondary | ICD-10-CM | POA: Insufficient documentation

## 2023-04-23 DIAGNOSIS — Z79899 Other long term (current) drug therapy: Secondary | ICD-10-CM | POA: Insufficient documentation

## 2023-04-23 DIAGNOSIS — R0781 Pleurodynia: Secondary | ICD-10-CM

## 2023-04-23 DIAGNOSIS — D649 Anemia, unspecified: Secondary | ICD-10-CM | POA: Insufficient documentation

## 2023-04-23 MED ORDER — OXYCODONE-ACETAMINOPHEN 5-325 MG PO TABS: 1.0000 | ORAL_TABLET | Freq: Once | ORAL | Status: DC

## 2023-04-23 MED ORDER — OXYCODONE HCL 5 MG PO TABS
5.0000 mg | ORAL_TABLET | Freq: Once | ORAL | Status: AC
  Administered 2023-04-23: 5 mg via ORAL
  Filled 2023-04-23: qty 1

## 2023-04-23 MED ORDER — METHOCARBAMOL 500 MG PO TABS: 500.0000 mg | ORAL_TABLET | Freq: Two times a day (BID) | ORAL | 0 refills | Status: AC

## 2023-04-23 MED ORDER — CYCLOBENZAPRINE HCL 10 MG PO TABS
5.0000 mg | ORAL_TABLET | Freq: Once | ORAL | Status: AC
  Administered 2023-04-23: 5 mg via ORAL
  Filled 2023-04-23: qty 1

## 2023-04-23 MED ORDER — IBUPROFEN 800 MG PO TABS
800.0000 mg | ORAL_TABLET | Freq: Once | ORAL | Status: AC
  Administered 2023-04-23: 800 mg via ORAL
  Filled 2023-04-23: qty 1

## 2023-04-23 MED ORDER — ACETAMINOPHEN 500 MG PO TABS
1000.0000 mg | ORAL_TABLET | Freq: Once | ORAL | Status: AC
  Administered 2023-04-23: 1000 mg via ORAL

## 2023-04-23 NOTE — ED Provider Notes (Signed)
 [place] EMERGENCY DEPARTMENT AT [facility] Provider Note   CSN: [id number] Arrival date & time: 04/23/23  1502     History  Chief Complaint  Patient presents with   Back Pain    Wendy Levine is a 58 y.o. female with PMHx anemia, anxiety, OA, HLD, HTN, OSA who presents to ED concerned for mid right back pain. Patient stating that her dog ran up and hit her from behind, causing her to fall back onto concrete slab. Patient stating that it hurts to move and take a deep breath in, but does not currently have any infectious symptoms.  Denies fever, cough, SOB, nausea, vomiting, diarrhea.     Back Pain      Home Medications Prior to Admission medications   Medication Sig Start Date End Date Taking? Authorizing Provider  methocarbamol (ROBAXIN) 500 MG tablet Take 1 tablet (500 mg total) by mouth 2 (two) times daily for 5 days. 04/23/23 04/28/23 Yes Franchelle Foskett, Charlotte Sanes F, PA-C  ALPRAZolam Prudy Feeler) 0.5 MG tablet TAKE ONE TABLET BY MOUTH AT BEDTIME AS NEEDED 03/11/23   Raynelle Dick, NP  busPIRone (BUSPAR) 5 MG tablet Take 1 tablet (5 mg total) by mouth 3 (three) times daily. Patient not taking: Reported on 03/29/2023 01/25/23   Raynelle Dick, NP  escitalopram (LEXAPRO) 20 MG tablet Take 1 tablet (20 mg total) by mouth daily. 04/02/23   Croitoru, Mihai, MD  fluticasone (FLONASE) 50 MCG/ACT nasal spray Place into both nostrils daily.    [provider]  Multiple Vitamin (MULTIVITAMIN ADULT PO) Take by mouth. Life Extension One-Per-Day    [provider]  Phendimetrazine Tartrate 35 MG TABS TAKE 1 TABLET BY MOUTH EVERY MORNING AND TAKE 1 TABLET AT LUNCH 03/11/23   Raynelle Dick, NP  promethazine (PHENERGAN) 25 MG tablet TAKE ONE TO TWO TABLETS BY MOUTH EVERY NIGHT AT BEDTIME AS NEEDED FOR SLEEP Patient not taking: Reported on 03/29/2023 10/31/21   Raynelle Dick, NP  rosuvastatin (CRESTOR) 10 MG tablet TAKE 1 TABLET BY MOUTH DAILY FOR CHOLESTEROL  08/25/22   Raynelle Dick, NP  TRULICITY 4.5 MG/0.5ML SOAJ Inject 4.5 mg into the skin once a week. 01/25/23   Raynelle Dick, NP  valACYclovir (VALTREX) 500 MG tablet TAKE ONE TO TWO TABLETS BY MOUTH DAILY FOR FEVER BLISTER Patient not taking: Reported on 03/29/2023 06/23/22   Raynelle Dick, NP  valsartan (DIOVAN) 80 MG tablet Take 1 tablet (80 mg total) by mouth daily. 03/29/23   Croitoru, Mihai, MD      Allergies    Doxycycline, Ace inhibitors, Adhesive [tape], Augmentin [amoxicillin-pot clavulanate], Clindamycin/lincomycin, Epinephrine, Epinephrine-lidocaine-na metabisulfite [lidocaine-epinephrine (pf)], Levaquin [levofloxacin], and Vicodin [hydrocodone-acetaminophen]    Review of Systems   Review of Systems  Musculoskeletal:  Positive for back pain.    Physical Exam Updated Vital Signs BP (!) 142/115 (BP Location: Right Arm)   Pulse 81   Temp 99.6 F (37.6 C)   Resp 18   Ht 5\' 5"  (1.651 m)   Wt 95.8 kg   SpO2 99%   BMI 35.15 kg/m  Physical Exam Vitals and nursing note reviewed.  Constitutional:      General: She is not in acute distress.    Appearance: She is not ill-appearing or toxic-appearing.  HENT:     Head: Normocephalic and atraumatic.  Eyes:     General: No scleral icterus.       Right eye: No discharge.  Left eye: No discharge.     Conjunctiva/sclera: Conjunctivae normal.  Cardiovascular:     Rate and Rhythm: Normal rate.  Pulmonary:     Effort: Pulmonary effort is normal.     Breath sounds: Normal breath sounds.  Abdominal:     General: Abdomen is flat.  Musculoskeletal:     Comments: Tenderness to palpation of right sided posterior back pain around rib 8, 9, and 10. No thoracic or lumbar spine tenderness to palpation.  No swelling, erythema, increased warmth, lacerations, hematomas appreciated of this area.  Skin:    General: Skin is warm and dry.  Neurological:     General: No focal deficit present.     Mental Status: She is alert. Mental  status is at baseline.  Psychiatric:        Mood and Affect: Mood normal.        Behavior: Behavior normal.     ED Results / Procedures / Treatments   Labs (all labs ordered are listed, but only abnormal results are displayed) Labs Reviewed - No data to display  EKG None  Radiology DG Ribs Unilateral W/Chest Right Result Date: 04/23/2023 CLINICAL DATA:  posterior rib pain EXAM: RIGHT RIBS AND CHEST - 3+ VIEW COMPARISON:  Chest x-ray 05/29/2015 FINDINGS: Atherosclerotic plaque. The heart and mediastinal contours are unchanged. No focal consolidation. No pulmonary edema. No pleural effusion. No pneumothorax. No fracture or other bone lesions are seen involving the right ribs. IMPRESSION: 1. No acute displaced right rib fracture. Please note, nondisplaced rib fractures may be occult on radiograph. 2. No acute cardiopulmonary abnormality. Electronically Signed   By: Tish Frederickson M.D.   On: 04/23/2023 17:39    Procedures Procedures    Medications Ordered in ED Medications  ibuprofen (ADVIL) tablet 800 mg (800 mg Oral Given 04/23/23 1525)  cyclobenzaprine (FLEXERIL) tablet 5 mg (5 mg Oral Given 04/23/23 1524)  acetaminophen (TYLENOL) tablet 1,000 mg (1,000 mg Oral Given 04/23/23 1733)  oxyCODONE (Oxy IR/ROXICODONE) immediate release tablet 5 mg (5 mg Oral Given 04/23/23 1811)    ED Course/ Medical Decision Making/ A&P                                 Medical Decision Making  This patient presents to the ED for concern of back pain, this involves an extensive number of treatment options, and is a complaint that carries with it a high risk of complications and morbidity.  The differential diagnosis includes pyelonephritis, nephrolithiasis, spinal abscess, osteomyelitis, herniated disc, muscle strain, spinal fracture, meningitis, cancer, cauda equina syndrome.   Co morbidities that complicate the patient evaluation   anemia, anxiety, OA, HLD, HTN, OSA   Additional history  obtained:  Additional history obtained from 01/2023 CMP with reassuring BUN/Cr   Problem List / ED Course / Critical interventions / Medication management  Patient presenting the ED concern for left posterior rib pain after her dog tripped her and she fell backwards onto the concrete. Denies head trauma, LOC, numbness, paresthesias.  Denies any infectious symptoms or respiratory symptoms today.  Patient afebrile with stable vitals.  Physical exam with tenderness palpation of the posterior right sided ribs.  No skin findings of this area.  Rest of physical exam reassuring. I ordered imaging studies including unilateral rib x-ray. I independently visualized and interpreted imaging which showed no acute process. I agree with the radiologist interpretation Shared results with patient.  Answered all questions.  Provided patient with a dose of Percocet per her request.  Educated patient on alternating ibuprofen and Tylenol for pain management.  Patient stating that her son is coming to pick her up.  Recommended following up with PCP.  Patient verbalized understanding of plan. I have reviewed the patients home medicines and have made adjustments as needed Patient afebrile with stable vitals. Patient ready for discharge. Provided patient with  return precaution.   Social Determinants of Health:  None         Final Clinical Impression(s) / ED Diagnoses Final diagnoses:  Rib pain on right side    Rx / DC Orders ED Discharge Orders          Ordered    methocarbamol (ROBAXIN) 500 MG tablet  2 times daily        04/23/23 1812              Dorthy Cooler, [place] 04/23/23 1812    Glyn Ade, MD 04/23/23 2317

## 2023-04-23 NOTE — ED Notes (Signed)
 Patient transported to X-ray

## 2023-04-23 NOTE — ED Triage Notes (Signed)
 Pt states her dog ran up behind her and hit her and made her knee give out and she fell on her back on concrete slab. Pt denies hitting head. Pt c/o mid right back pain. Pt denies numbness or tingling.

## 2023-04-23 NOTE — Discharge Instructions (Addendum)
 It was a pleasure caring for you today.  As discussed, if symptoms are not resolving appropriately in the next 7 days, you will be eligible for a repeat x-ray.  Please follow-up with your primary care.  Seek emergency care experiencing any new or worsening symptoms.  Alternating between 650 mg Tylenol and 400 mg Advil: The best way to alternate taking Acetaminophen (example Tylenol) and Ibuprofen (example Advil/Motrin) is to take them 3 hours apart. For example, if you take ibuprofen at 6 am you can then take Tylenol at 9 am. You can continue this regimen throughout the day, making sure you do not exceed the recommended maximum dose for each drug.

## 2023-04-24 ENCOUNTER — Encounter: Payer: Self-pay | Admitting: Cardiovascular Disease

## 2023-04-24 ENCOUNTER — Telehealth: Payer: Self-pay | Admitting: Cardiology

## 2023-04-24 MED ORDER — TRAMADOL HCL 50 MG PO TABS: 50.0000 mg | ORAL_TABLET | Freq: Three times a day (TID) | ORAL | 0 refills | Status: AC | PRN

## 2023-04-24 NOTE — Telephone Encounter (Signed)
 Patient contacted [place] earlier this AM about back pain. Yesterday, her dog had knocked her over and she fell onto her back onto concrete. She went to the ED. X-ray showed no acute displaced rib fractures. She was given one dose of percocet in the ED and was discharged with Robaxin 500  mg BID for 5 days and instructed to try to manage pain with advil and tylenol. She has continued to have significant pain despite taking these medications.   Discussed with Dr. Royann Shivers. Patient currently between PCPs and does not have an appointment with new PCP until Tuesday. Recommend starting a short course of tramadol. I called patient to discuss further. She has taken tramadol in the past after knee surgeries and she tolerated the medication well. Reviewed side effects. Sent 15 tablets to her requested pharmacy   Jonita Albee, PA-C 04/24/2023 12:05 PM

## 2023-04-27 DIAGNOSIS — S2241XA Multiple fractures of ribs, right side, initial encounter for closed fracture: Secondary | ICD-10-CM | POA: Diagnosis not present

## 2023-04-27 DIAGNOSIS — G4733 Obstructive sleep apnea (adult) (pediatric): Secondary | ICD-10-CM | POA: Diagnosis not present

## 2023-04-27 DIAGNOSIS — F330 Major depressive disorder, recurrent, mild: Secondary | ICD-10-CM | POA: Diagnosis not present

## 2023-04-27 DIAGNOSIS — Z6835 Body mass index (BMI) 35.0-35.9, adult: Secondary | ICD-10-CM | POA: Diagnosis not present

## 2023-04-27 DIAGNOSIS — E782 Mixed hyperlipidemia: Secondary | ICD-10-CM | POA: Diagnosis not present

## 2023-04-27 DIAGNOSIS — R454 Irritability and anger: Secondary | ICD-10-CM | POA: Diagnosis not present

## 2023-04-27 DIAGNOSIS — F5104 Psychophysiologic insomnia: Secondary | ICD-10-CM | POA: Diagnosis not present

## 2023-04-27 DIAGNOSIS — E66812 Obesity, class 2: Secondary | ICD-10-CM | POA: Diagnosis not present

## 2023-04-27 DIAGNOSIS — I10 Essential (primary) hypertension: Secondary | ICD-10-CM | POA: Diagnosis not present

## 2023-05-25 DIAGNOSIS — D1801 Hemangioma of skin and subcutaneous tissue: Secondary | ICD-10-CM | POA: Diagnosis not present

## 2023-05-25 DIAGNOSIS — D225 Melanocytic nevi of trunk: Secondary | ICD-10-CM | POA: Diagnosis not present

## 2023-05-25 DIAGNOSIS — L438 Other lichen planus: Secondary | ICD-10-CM | POA: Diagnosis not present

## 2023-05-25 DIAGNOSIS — L738 Other specified follicular disorders: Secondary | ICD-10-CM | POA: Diagnosis not present

## 2023-05-25 DIAGNOSIS — L821 Other seborrheic keratosis: Secondary | ICD-10-CM | POA: Diagnosis not present

## 2023-05-25 DIAGNOSIS — L814 Other melanin hyperpigmentation: Secondary | ICD-10-CM | POA: Diagnosis not present

## 2023-05-27 ENCOUNTER — Encounter: Payer: 59 | Admitting: Nurse Practitioner

## 2023-06-23 ENCOUNTER — Encounter: Payer: 59 | Admitting: Nurse Practitioner

## 2023-09-17 DIAGNOSIS — F329 Major depressive disorder, single episode, unspecified: Secondary | ICD-10-CM | POA: Diagnosis not present

## 2023-09-17 DIAGNOSIS — I10 Essential (primary) hypertension: Secondary | ICD-10-CM | POA: Diagnosis not present

## 2023-09-17 DIAGNOSIS — M778 Other enthesopathies, not elsewhere classified: Secondary | ICD-10-CM | POA: Diagnosis not present

## 2023-09-17 DIAGNOSIS — F411 Generalized anxiety disorder: Secondary | ICD-10-CM | POA: Diagnosis not present

## 2023-09-17 DIAGNOSIS — E782 Mixed hyperlipidemia: Secondary | ICD-10-CM | POA: Diagnosis not present

## 2023-11-25 DIAGNOSIS — L814 Other melanin hyperpigmentation: Secondary | ICD-10-CM | POA: Diagnosis not present

## 2023-11-25 DIAGNOSIS — D2261 Melanocytic nevi of right upper limb, including shoulder: Secondary | ICD-10-CM | POA: Diagnosis not present

## 2023-12-06 DIAGNOSIS — F329 Major depressive disorder, single episode, unspecified: Secondary | ICD-10-CM | POA: Diagnosis not present

## 2023-12-13 DIAGNOSIS — B349 Viral infection, unspecified: Secondary | ICD-10-CM | POA: Diagnosis not present

## 2023-12-13 DIAGNOSIS — L299 Pruritus, unspecified: Secondary | ICD-10-CM | POA: Diagnosis not present

## 2024-04-06 ENCOUNTER — Ambulatory Visit: Admitting: Family Medicine
